# Patient Record
Sex: Male | Born: 1940
Health system: Southern US, Community
[De-identification: ages and names within clinical notes are randomized; demographics above are authoritative.]

## PROBLEM LIST (undated history)

## (undated) DIAGNOSIS — M1990 Unspecified osteoarthritis, unspecified site: Secondary | ICD-10-CM

## (undated) DIAGNOSIS — K219 Gastro-esophageal reflux disease without esophagitis: Secondary | ICD-10-CM

## (undated) DIAGNOSIS — Z85828 Personal history of other malignant neoplasm of skin: Secondary | ICD-10-CM

## (undated) DIAGNOSIS — F32A Depression, unspecified: Secondary | ICD-10-CM

## (undated) DIAGNOSIS — J189 Pneumonia, unspecified organism: Secondary | ICD-10-CM

## (undated) DIAGNOSIS — I10 Essential (primary) hypertension: Secondary | ICD-10-CM

## (undated) DIAGNOSIS — I639 Cerebral infarction, unspecified: Secondary | ICD-10-CM

## (undated) DIAGNOSIS — N529 Male erectile dysfunction, unspecified: Secondary | ICD-10-CM

## (undated) DIAGNOSIS — E785 Hyperlipidemia, unspecified: Secondary | ICD-10-CM

## (undated) HISTORY — PX: HERNIA REPAIR: SHX51

## (undated) HISTORY — DX: Male erectile dysfunction, unspecified: N52.9

## (undated) HISTORY — PX: CATARACT EXTRACTION: SUR2

## (undated) HISTORY — DX: Hyperlipidemia, unspecified: E78.5

## (undated) HISTORY — PX: SKIN CANCER EXCISION: SHX779

## (undated) HISTORY — DX: Essential (primary) hypertension: I10

---

## 1999-02-12 ENCOUNTER — Encounter: Payer: Self-pay | Admitting: Specialist

## 1999-02-12 ENCOUNTER — Encounter: Admission: RE | Admit: 1999-02-12 | Discharge: 1999-02-12 | Payer: Self-pay | Admitting: Specialist

## 1999-04-11 ENCOUNTER — Ambulatory Visit (HOSPITAL_COMMUNITY): Admission: RE | Admit: 1999-04-11 | Discharge: 1999-04-11 | Payer: Self-pay | Admitting: Specialist

## 1999-04-11 ENCOUNTER — Encounter: Payer: Self-pay | Admitting: Specialist

## 1999-04-25 ENCOUNTER — Encounter: Payer: Self-pay | Admitting: Specialist

## 1999-04-25 ENCOUNTER — Ambulatory Visit (HOSPITAL_COMMUNITY): Admission: RE | Admit: 1999-04-25 | Discharge: 1999-04-25 | Payer: Self-pay | Admitting: Specialist

## 1999-05-09 ENCOUNTER — Encounter: Payer: Self-pay | Admitting: Specialist

## 1999-05-09 ENCOUNTER — Ambulatory Visit (HOSPITAL_COMMUNITY): Admission: RE | Admit: 1999-05-09 | Discharge: 1999-05-09 | Payer: Self-pay | Admitting: Specialist

## 1999-06-28 ENCOUNTER — Ambulatory Visit (HOSPITAL_COMMUNITY): Admission: RE | Admit: 1999-06-28 | Discharge: 1999-06-28 | Payer: Self-pay | Admitting: Specialist

## 1999-06-28 ENCOUNTER — Encounter: Payer: Self-pay | Admitting: Specialist

## 2001-02-10 ENCOUNTER — Encounter: Payer: Self-pay | Admitting: Anesthesiology

## 2001-02-10 ENCOUNTER — Encounter: Admission: RE | Admit: 2001-02-10 | Discharge: 2001-02-10 | Payer: Self-pay | Admitting: Anesthesiology

## 2008-12-19 ENCOUNTER — Emergency Department (HOSPITAL_COMMUNITY): Admission: EM | Admit: 2008-12-19 | Discharge: 2008-12-19 | Payer: Self-pay | Admitting: Emergency Medicine

## 2010-07-03 ENCOUNTER — Encounter: Payer: Self-pay | Admitting: Physician Assistant

## 2010-07-03 DIAGNOSIS — E785 Hyperlipidemia, unspecified: Secondary | ICD-10-CM

## 2010-07-03 DIAGNOSIS — I10 Essential (primary) hypertension: Secondary | ICD-10-CM | POA: Insufficient documentation

## 2010-07-03 DIAGNOSIS — J309 Allergic rhinitis, unspecified: Secondary | ICD-10-CM | POA: Insufficient documentation

## 2010-07-03 DIAGNOSIS — K648 Other hemorrhoids: Secondary | ICD-10-CM | POA: Insufficient documentation

## 2010-07-03 DIAGNOSIS — K5730 Diverticulosis of large intestine without perforation or abscess without bleeding: Secondary | ICD-10-CM | POA: Insufficient documentation

## 2010-07-03 DIAGNOSIS — N529 Male erectile dysfunction, unspecified: Secondary | ICD-10-CM

## 2012-06-29 ENCOUNTER — Other Ambulatory Visit: Payer: Self-pay | Admitting: Physician Assistant

## 2012-06-29 NOTE — Telephone Encounter (Signed)
?   Chart says allergic to fluticasone?

## 2012-08-28 ENCOUNTER — Telehealth: Payer: Self-pay

## 2012-08-28 NOTE — Telephone Encounter (Signed)
Per dr Modesto Charon ok to refill 1 month only and pt understands rx called in with no refills and needs to sch appt with new provider since [facility] no longer available. Pt states seen [facility] 02-2012 and needs refill metoprolol 25mg  qd,doxazosin 8mg  qd, pravastatin 40 mg qhs and hctz 25 mg qd

## 2012-08-30 NOTE — Telephone Encounter (Signed)
This was done on Saturday per dr Modesto Charon request.

## 2012-10-12 ENCOUNTER — Ambulatory Visit (INDEPENDENT_AMBULATORY_CARE_PROVIDER_SITE_OTHER): Payer: Medicare Other | Admitting: Family Medicine

## 2012-10-12 ENCOUNTER — Encounter: Payer: Self-pay | Admitting: Family Medicine

## 2012-10-12 VITALS — BP 159/96 | HR 80 | Temp 99.3°F | Ht 68.0 in | Wt 234.4 lb

## 2012-10-12 DIAGNOSIS — E785 Hyperlipidemia, unspecified: Secondary | ICD-10-CM

## 2012-10-12 DIAGNOSIS — I10 Essential (primary) hypertension: Secondary | ICD-10-CM

## 2012-10-12 DIAGNOSIS — N400 Enlarged prostate without lower urinary tract symptoms: Secondary | ICD-10-CM

## 2012-10-12 DIAGNOSIS — J309 Allergic rhinitis, unspecified: Secondary | ICD-10-CM

## 2012-10-12 DIAGNOSIS — Z0000 Encounter for general adult medical examination without abnormal findings: Secondary | ICD-10-CM

## 2012-10-12 DIAGNOSIS — N529 Male erectile dysfunction, unspecified: Secondary | ICD-10-CM

## 2012-10-12 LAB — POCT CBC
Granulocyte percent: 65.8 %G (ref 37–80)
HCT, POC: 47.6 % (ref 43.5–53.7)
Hemoglobin: 16.5 g/dL (ref 14.1–18.1)
Lymph, poc: 2 (ref 0.6–3.4)
MCH, POC: 30.2 pg (ref 27–31.2)
MCHC: 34.7 g/dL (ref 31.8–35.4)
MCV: 87.1 fL (ref 80–97)
MPV: 7.3 fL (ref 0–99.8)
POC Granulocyte: 4.7 (ref 2–6.9)
POC LYMPH PERCENT: 28.2 %L (ref 10–50)
Platelet Count, POC: 210 10*3/uL (ref 142–424)
RBC: 5.5 M/uL (ref 4.69–6.13)
RDW, POC: 12.8 %
WBC: 7.1 10*3/uL (ref 4.6–10.2)

## 2012-10-12 LAB — LIPID PANEL
Cholesterol: 146 mg/dL (ref 0–200)
HDL: 39 mg/dL — ABNORMAL LOW (ref >39)
LDL Cholesterol: 78 mg/dL (ref 0–99)
Total CHOL/HDL Ratio: 3.7 Ratio
Triglycerides: 147 mg/dL (ref <150)
VLDL: 29 mg/dL (ref 0–40)

## 2012-10-12 LAB — COMPLETE METABOLIC PANEL WITH GFR
ALT: 17 U/L (ref 0–53)
AST: 17 U/L (ref 0–37)
Albumin: 4.2 g/dL (ref 3.5–5.2)
Alkaline Phosphatase: 61 U/L (ref 39–117)
BUN: 9 mg/dL (ref 6–23)
CO2: 32 mEq/L (ref 19–32)
Calcium: 9.3 mg/dL (ref 8.4–10.5)
Chloride: 98 mEq/L (ref 96–112)
Creat: 1.07 mg/dL (ref 0.50–1.35)
GFR, Est African American: 81 mL/min
GFR, Est Non African American: 70 mL/min
Glucose, Bld: 115 mg/dL — ABNORMAL HIGH (ref 70–99)
Potassium: 3.9 mEq/L (ref 3.5–5.3)
Sodium: 138 mEq/L (ref 135–145)
Total Bilirubin: 0.6 mg/dL (ref 0.3–1.2)
Total Protein: 6.8 g/dL (ref 6.0–8.3)

## 2012-10-12 LAB — PSA: PSA: 0.56 ng/mL (ref <=4.00)

## 2012-10-12 LAB — TSH: TSH: 2.497 u[IU]/mL (ref 0.350–4.500)

## 2012-10-12 MED ORDER — HYDROCHLOROTHIAZIDE 25 MG PO TABS: 25.0000 mg | ORAL_TABLET | Freq: Every day | ORAL | Status: DC

## 2012-10-12 MED ORDER — METOPROLOL TARTRATE 100 MG PO TABS: ORAL_TABLET | ORAL | Status: DC

## 2012-10-12 MED ORDER — SILDENAFIL CITRATE 100 MG PO TABS: 50.0000 mg | ORAL_TABLET | Freq: Every day | ORAL | Status: DC | PRN

## 2012-10-12 MED ORDER — FLUTICASONE PROPIONATE 50 MCG/ACT NA SUSP: 2.0000 | Freq: Every day | NASAL | Status: DC

## 2012-10-12 MED ORDER — QUINAPRIL HCL 40 MG PO TABS: 40.0000 mg | ORAL_TABLET | Freq: Every day | ORAL | Status: DC

## 2012-10-12 MED ORDER — PRAVASTATIN SODIUM 40 MG PO TABS: 40.0000 mg | ORAL_TABLET | Freq: Every day | ORAL | Status: DC

## 2012-10-12 MED ORDER — DOXAZOSIN MESYLATE 8 MG PO TABS: 8.0000 mg | ORAL_TABLET | Freq: Every day | ORAL | Status: DC

## 2012-10-12 NOTE — Progress Notes (Signed)
  Subjective:    Patient ID: Derrick Lawrence, male    DOB: 12/29/1940, 72 y.o.   MRN: [id number]  Hypertension This is a chronic problem. The problem is uncontrolled. Pertinent negatives include no blurred vision, palpitations or shortness of breath. Risk factors for coronary artery disease include male gender. Past treatments include ACE inhibitors, alpha 1 blockers and diuretics. The current treatment provides no improvement. There is no history of a thyroid problem.  Hyperlipidemia This is a chronic problem. The current episode started more than 1 year ago. The problem is controlled. Recent lipid tests were reviewed and are normal. He has no history of diabetes. Pertinent negatives include no leg pain, myalgias or shortness of breath. Current antihyperlipidemic treatment includes statins. The current treatment provides moderate improvement of lipids. Risk factors for coronary artery disease include dyslipidemia, hypertension and male sex.      Review of Systems  Eyes: Negative for blurred vision.  Respiratory: Negative for shortness of breath.   Cardiovascular: Negative for palpitations.  Musculoskeletal: Negative for myalgias.       Right shoulder pain with movement   All other systems reviewed and are negative.       Objective:   Physical Exam  Constitutional: He is oriented to person, place, and time. He appears well-developed and well-nourished.  HENT:  Head: Normocephalic.  Eyes: Pupils are equal, round, and reactive to light.  Neck: Normal range of motion. Neck supple. No thyromegaly present.  Cardiovascular: Normal rate, normal heart sounds and intact distal pulses.   Pulmonary/Chest: Effort normal and breath sounds normal.  Abdominal: Soft. Bowel sounds are normal. He exhibits no distension. There is no tenderness.  Musculoskeletal: Normal range of motion. He exhibits tenderness. He exhibits no edema.  Decreased ROM in right shoulder due to pain   Neurological: He is  alert and oriented to person, place, and time.  Skin: Skin is warm and dry.  Psychiatric: He has a normal mood and affect. His behavior is normal. Judgment and thought content normal.    BP 159/96  Pulse 80  Temp(Src) 99.3 F (37.4 C) (Oral)  Ht 5\' 8"  (1.727 m)  Wt 234 lb 6.4 oz (106.323 kg)  BMI 35.65 kg/m2       Assessment & Plan:  Essential hypertension, benign - Plan: quinapril (ACCUPRIL) 40 MG tablet, hydrochlorothiazide (HYDRODIURIL) 25 MG tablet, POCT CBC  Allergic rhinitis - Plan: fluticasone (FLONASE) 50 MCG/ACT nasal spray  BPH (benign prostatic hyperplasia) - Plan: doxazosin (CARDURA) 8 MG tablet, PSA  Other and unspecified hyperlipidemia - Plan: pravastatin (PRAVACHOL) 40 MG tablet, Lipid panel  Routine general medical examination at a health care facility - Plan: POCT CBC, Lipid panel, TSH, COMPLETE METABOLIC PANEL WITH GFR, PSA  Shoulder pain. Advised patient to follow up for shoulder injection for shoulder arthralgias.

## 2012-10-12 NOTE — Patient Instructions (Addendum)
Hypertension As your heart beats, it forces blood through your arteries. This force is your blood pressure. If the pressure is too high, it is called hypertension (HTN) or high blood pressure. HTN is dangerous because you may have it and not know it. High blood pressure may mean that your heart has to work harder to pump blood. Your arteries may be narrow or stiff. The extra work puts you at risk for heart disease, stroke, and other problems.  Blood pressure consists of two numbers, a higher number over a lower, 110/72, for example. It is stated as "110 over 72." The ideal is below 120 for the top number (systolic) and under 80 for the bottom (diastolic). Write down your blood pressure today. You should pay close attention to your blood pressure if you have certain conditions such as:  Heart failure.  Prior heart attack.  Diabetes  Chronic kidney disease.  Prior stroke.  Multiple risk factors for heart disease. To see if you have HTN, your blood pressure should be measured while you are seated with your arm held at the level of the heart. It should be measured at least twice. A one-time elevated blood pressure reading (especially in the Emergency Department) does not mean that you need treatment. There may be conditions in which the blood pressure is different between your right and left arms. It is important to see your caregiver soon for a recheck. Most people have essential hypertension which means that there is not a specific cause. This type of high blood pressure may be lowered by changing lifestyle factors such as:  Stress.  Smoking.  Lack of exercise.  Excessive weight.  Drug/tobacco/alcohol use.  Eating less salt. Most people do not have symptoms from high blood pressure until it has caused damage to the body. Effective treatment can often prevent, delay or reduce that damage. TREATMENT  When a cause has been identified, treatment for high blood pressure is directed at the  cause. There are a large number of medications to treat HTN. These fall into several categories, and your caregiver will help you select the medicines that are best for you. Medications may have side effects. You should review side effects with your caregiver. If your blood pressure stays high after you have made lifestyle changes or started on medicines,   Your medication(s) may need to be changed.  Other problems may need to be addressed.  Be certain you understand your prescriptions, and know how and when to take your medicine.  Be sure to follow up with your caregiver within the time frame advised (usually within two weeks) to have your blood pressure rechecked and to review your medications.  If you are taking more than one medicine to lower your blood pressure, make sure you know how and at what times they should be taken. Taking two medicines at the same time can result in blood pressure that is too low. SEEK IMMEDIATE MEDICAL CARE IF:  You develop a severe headache, blurred or changing vision, or confusion.  You have unusual weakness or numbness, or a faint feeling.  You have severe chest or abdominal pain, vomiting, or breathing problems. MAKE SURE YOU:   Understand these instructions.  Will watch your condition.  Will get help right away if you are not doing well or get worse. Document Released: 03/10/2005 Document Revised: 06/02/2011 Document Reviewed: 10/29/2007 [facility] Patient Information 2014 [place], [place]. Shoulder Pain The shoulder is the joint that connects your arms to your body. The bones  that form the shoulder joint include the upper arm bone (humerus), the shoulder blade (scapula), and the collarbone (clavicle). The top of the humerus is shaped like a ball and fits into a rather flat socket on the scapula (glenoid cavity). A combination of muscles and strong, fibrous tissues that connect muscles to bones (tendons) support your shoulder joint and hold the ball in  the socket. Small, fluid-filled sacs (bursae) are located in different areas of the joint. They act as cushions between the bones and the overlying soft tissues and help reduce friction between the gliding tendons and the bone as you move your arm. Your shoulder joint allows a wide range of motion in your arm. This range of motion allows you to do things like scratch your back or throw a ball. However, this range of motion also makes your shoulder more prone to pain from overuse and injury. Causes of shoulder pain can originate from both injury and overuse and usually can be grouped in the following four categories:  Redness, swelling, and pain (inflammation) of the tendon (tendinitis) or the bursae (bursitis).  Instability, such as a dislocation of the joint.  Inflammation of the joint (arthritis).  Broken bone (fracture). HOME CARE INSTRUCTIONS   Apply ice to the sore area.  Put ice in a plastic bag.  Place a towel between your skin and the bag.  Leave the ice on for 15-20 minutes, 3-4 times per day for the first 2 days.  Stop using cold packs if they do not help with the pain.  If you have a shoulder sling or immobilizer, wear it as long as your caregiver instructs. Only remove it to shower or bathe. Move your arm as little as possible, but keep your hand moving to prevent swelling.  Squeeze a soft ball or foam pad as much as possible to help prevent swelling.  Only take over-the-counter or prescription medicines for pain, discomfort, or fever as directed by your caregiver. SEEK MEDICAL CARE IF:   Your shoulder pain increases, or new pain develops in your arm, hand, or fingers.  Your hand or fingers become cold and numb.  Your pain is not relieved with medicines. SEEK IMMEDIATE MEDICAL CARE IF:   Your arm, hand, or fingers are numb or tingling.  Your arm, hand, or fingers are significantly swollen or turn white or blue. MAKE SURE YOU:   Understand these  instructions.  Will watch your condition.  Will get help right away if you are not doing well or get worse. Document Released: 12/18/2004 Document Revised: 12/03/2011 Document Reviewed: 02/22/2011 [facility] Patient Information 2014 [place], [place].

## 2012-10-13 ENCOUNTER — Ambulatory Visit (INDEPENDENT_AMBULATORY_CARE_PROVIDER_SITE_OTHER): Payer: Medicare Other

## 2012-10-13 DIAGNOSIS — M25511 Pain in right shoulder: Secondary | ICD-10-CM

## 2012-10-13 DIAGNOSIS — M25519 Pain in unspecified shoulder: Secondary | ICD-10-CM

## 2012-10-13 MED ORDER — TRIAMCINOLONE ACETONIDE 40 MG/ML IJ SUSP
40.0000 mg | Freq: Once | INTRAMUSCULAR | Status: AC
  Administered 2012-10-13: 40 mg via INTRAMUSCULAR

## 2012-10-13 MED ORDER — LIDOCAINE HCL 1 % IJ SOLN
10.0000 mg | Freq: Once | INTRAMUSCULAR | Status: AC
  Administered 2012-10-13: 10 mg

## 2012-10-26 ENCOUNTER — Encounter: Payer: Self-pay | Admitting: Family Medicine

## 2012-10-26 ENCOUNTER — Ambulatory Visit (INDEPENDENT_AMBULATORY_CARE_PROVIDER_SITE_OTHER): Payer: Medicare Other | Admitting: Family Medicine

## 2012-10-26 VITALS — BP 148/90 | HR 66 | Temp 97.8°F | Wt 231.6 lb

## 2012-10-26 DIAGNOSIS — I10 Essential (primary) hypertension: Secondary | ICD-10-CM

## 2012-10-26 DIAGNOSIS — N400 Enlarged prostate without lower urinary tract symptoms: Secondary | ICD-10-CM

## 2012-10-26 MED ORDER — DOXAZOSIN MESYLATE 8 MG PO TABS
8.0000 mg | ORAL_TABLET | Freq: Every day | ORAL | Status: DC
Start: 2012-10-26 — End: 2024-02-17

## 2012-10-26 NOTE — Progress Notes (Signed)
  Subjective:    Patient ID: HASSANI SLINEY, male    DOB: 17-Jun-1940, 72 y.o.   MRN: [id number]  HPI  This 72 y.o. male presents for evaluation of follow up on hypertension.  He had his bp medicine Adjusted last visit and is here for follow up.  He did not get his bph medicine last visit.  Review of Systems No chest pain, SOB, HA, dizziness, vision change, N/V, diarrhea, constipation, dysuria, urinary urgency or frequency, myalgias, arthralgias or rash.     Objective:   Physical Exam  Vital signs noted  Well developed well nourished male.  HEENT - Head atraumatic Normocephalic                Eyes - PERRLA, Conjuctiva - clear Sclera- Clear EOMI                Ears - EAC's Wnl TM's Wnl Gross Hearing WNL                Nose - Nares patent                 Throat - oropharanx wnl Respiratory - Lungs CTA bilateral Cardiac - RRR S1 and S2 without murmur GI - Abdomen soft Nontender and bowel sounds active x 4 Extremities - No edema. Neuro - Grossly intact.      Assessment & Plan:  Essential hypertension, benign - Continue current regimen.  BPH (benign prostatic hyperplasia) - Plan: doxazosin (CARDURA) 8 MG tablet

## 2012-10-26 NOTE — Patient Instructions (Signed)

## 2013-04-28 ENCOUNTER — Ambulatory Visit: Payer: Medicare Other | Admitting: Family Medicine

## 2015-04-16 DIAGNOSIS — M25561 Pain in right knee: Secondary | ICD-10-CM | POA: Diagnosis not present

## 2015-04-16 DIAGNOSIS — M170 Bilateral primary osteoarthritis of knee: Secondary | ICD-10-CM | POA: Diagnosis not present

## 2015-07-17 DIAGNOSIS — M25562 Pain in left knee: Secondary | ICD-10-CM | POA: Diagnosis not present

## 2015-07-17 DIAGNOSIS — M25561 Pain in right knee: Secondary | ICD-10-CM | POA: Diagnosis not present

## 2015-07-17 DIAGNOSIS — M170 Bilateral primary osteoarthritis of knee: Secondary | ICD-10-CM | POA: Diagnosis not present

## 2015-07-23 DIAGNOSIS — E782 Mixed hyperlipidemia: Secondary | ICD-10-CM | POA: Diagnosis not present

## 2015-07-23 DIAGNOSIS — I10 Essential (primary) hypertension: Secondary | ICD-10-CM | POA: Diagnosis not present

## 2015-07-23 DIAGNOSIS — Z86010 Personal history of colonic polyps: Secondary | ICD-10-CM | POA: Diagnosis not present

## 2015-07-25 DIAGNOSIS — R262 Difficulty in walking, not elsewhere classified: Secondary | ICD-10-CM | POA: Diagnosis not present

## 2015-07-25 DIAGNOSIS — M1711 Unilateral primary osteoarthritis, right knee: Secondary | ICD-10-CM | POA: Diagnosis not present

## 2015-07-25 DIAGNOSIS — R2689 Other abnormalities of gait and mobility: Secondary | ICD-10-CM | POA: Diagnosis not present

## 2015-07-25 DIAGNOSIS — M25561 Pain in right knee: Secondary | ICD-10-CM | POA: Diagnosis not present

## 2015-07-31 DIAGNOSIS — M1711 Unilateral primary osteoarthritis, right knee: Secondary | ICD-10-CM | POA: Diagnosis not present

## 2015-07-31 DIAGNOSIS — M25561 Pain in right knee: Secondary | ICD-10-CM | POA: Diagnosis not present

## 2015-08-02 DIAGNOSIS — K635 Polyp of colon: Secondary | ICD-10-CM | POA: Diagnosis not present

## 2015-08-02 DIAGNOSIS — K5730 Diverticulosis of large intestine without perforation or abscess without bleeding: Secondary | ICD-10-CM | POA: Diagnosis not present

## 2015-08-02 DIAGNOSIS — Z1211 Encounter for screening for malignant neoplasm of colon: Secondary | ICD-10-CM | POA: Diagnosis not present

## 2015-08-02 DIAGNOSIS — D123 Benign neoplasm of transverse colon: Secondary | ICD-10-CM | POA: Diagnosis not present

## 2015-08-02 DIAGNOSIS — Z86010 Personal history of colonic polyps: Secondary | ICD-10-CM | POA: Diagnosis not present

## 2015-08-07 DIAGNOSIS — M25561 Pain in right knee: Secondary | ICD-10-CM | POA: Diagnosis not present

## 2015-08-07 DIAGNOSIS — M1711 Unilateral primary osteoarthritis, right knee: Secondary | ICD-10-CM | POA: Diagnosis not present

## 2015-08-15 DIAGNOSIS — M25561 Pain in right knee: Secondary | ICD-10-CM | POA: Diagnosis not present

## 2015-08-15 DIAGNOSIS — M1711 Unilateral primary osteoarthritis, right knee: Secondary | ICD-10-CM | POA: Diagnosis not present

## 2015-08-23 DIAGNOSIS — R2689 Other abnormalities of gait and mobility: Secondary | ICD-10-CM | POA: Diagnosis not present

## 2015-08-23 DIAGNOSIS — M25561 Pain in right knee: Secondary | ICD-10-CM | POA: Diagnosis not present

## 2015-08-23 DIAGNOSIS — M1711 Unilateral primary osteoarthritis, right knee: Secondary | ICD-10-CM | POA: Diagnosis not present

## 2016-01-21 DIAGNOSIS — K5900 Constipation, unspecified: Secondary | ICD-10-CM | POA: Diagnosis not present

## 2016-01-21 DIAGNOSIS — L570 Actinic keratosis: Secondary | ICD-10-CM | POA: Diagnosis not present

## 2016-01-21 DIAGNOSIS — N138 Other obstructive and reflux uropathy: Secondary | ICD-10-CM | POA: Diagnosis not present

## 2016-01-21 DIAGNOSIS — J301 Allergic rhinitis due to pollen: Secondary | ICD-10-CM | POA: Diagnosis not present

## 2016-01-21 DIAGNOSIS — Z23 Encounter for immunization: Secondary | ICD-10-CM | POA: Diagnosis not present

## 2016-01-21 DIAGNOSIS — M1711 Unilateral primary osteoarthritis, right knee: Secondary | ICD-10-CM | POA: Diagnosis not present

## 2016-01-21 DIAGNOSIS — N401 Enlarged prostate with lower urinary tract symptoms: Secondary | ICD-10-CM | POA: Diagnosis not present

## 2016-01-21 DIAGNOSIS — I10 Essential (primary) hypertension: Secondary | ICD-10-CM | POA: Diagnosis not present

## 2016-01-21 DIAGNOSIS — Z0001 Encounter for general adult medical examination with abnormal findings: Secondary | ICD-10-CM | POA: Diagnosis not present

## 2016-01-21 DIAGNOSIS — E7800 Pure hypercholesterolemia, unspecified: Secondary | ICD-10-CM | POA: Diagnosis not present

## 2016-01-24 DIAGNOSIS — J301 Allergic rhinitis due to pollen: Secondary | ICD-10-CM | POA: Diagnosis not present

## 2016-01-24 DIAGNOSIS — Z23 Encounter for immunization: Secondary | ICD-10-CM | POA: Diagnosis not present

## 2016-01-24 DIAGNOSIS — N138 Other obstructive and reflux uropathy: Secondary | ICD-10-CM | POA: Diagnosis not present

## 2016-01-24 DIAGNOSIS — K5900 Constipation, unspecified: Secondary | ICD-10-CM | POA: Diagnosis not present

## 2016-01-24 DIAGNOSIS — E7800 Pure hypercholesterolemia, unspecified: Secondary | ICD-10-CM | POA: Diagnosis not present

## 2016-01-24 DIAGNOSIS — L570 Actinic keratosis: Secondary | ICD-10-CM | POA: Diagnosis not present

## 2016-01-24 DIAGNOSIS — N401 Enlarged prostate with lower urinary tract symptoms: Secondary | ICD-10-CM | POA: Diagnosis not present

## 2016-01-24 DIAGNOSIS — Z0001 Encounter for general adult medical examination with abnormal findings: Secondary | ICD-10-CM | POA: Diagnosis not present

## 2016-01-24 DIAGNOSIS — M1711 Unilateral primary osteoarthritis, right knee: Secondary | ICD-10-CM | POA: Diagnosis not present

## 2016-01-24 DIAGNOSIS — I10 Essential (primary) hypertension: Secondary | ICD-10-CM | POA: Diagnosis not present

## 2017-01-26 DIAGNOSIS — Z0000 Encounter for general adult medical examination without abnormal findings: Secondary | ICD-10-CM | POA: Diagnosis not present

## 2017-01-26 DIAGNOSIS — B002 Herpesviral gingivostomatitis and pharyngotonsillitis: Secondary | ICD-10-CM | POA: Diagnosis not present

## 2017-01-26 DIAGNOSIS — I10 Essential (primary) hypertension: Secondary | ICD-10-CM | POA: Diagnosis not present

## 2017-01-26 DIAGNOSIS — L570 Actinic keratosis: Secondary | ICD-10-CM | POA: Diagnosis not present

## 2017-01-26 DIAGNOSIS — E7800 Pure hypercholesterolemia, unspecified: Secondary | ICD-10-CM | POA: Diagnosis not present

## 2017-02-09 DIAGNOSIS — M25561 Pain in right knee: Secondary | ICD-10-CM | POA: Diagnosis not present

## 2018-01-07 DIAGNOSIS — E7800 Pure hypercholesterolemia, unspecified: Secondary | ICD-10-CM | POA: Diagnosis not present

## 2018-01-07 DIAGNOSIS — I10 Essential (primary) hypertension: Secondary | ICD-10-CM | POA: Diagnosis not present

## 2018-01-28 DIAGNOSIS — J301 Allergic rhinitis due to pollen: Secondary | ICD-10-CM | POA: Diagnosis not present

## 2018-01-28 DIAGNOSIS — I10 Essential (primary) hypertension: Secondary | ICD-10-CM | POA: Diagnosis not present

## 2018-01-28 DIAGNOSIS — N138 Other obstructive and reflux uropathy: Secondary | ICD-10-CM | POA: Diagnosis not present

## 2018-01-28 DIAGNOSIS — Z0000 Encounter for general adult medical examination without abnormal findings: Secondary | ICD-10-CM | POA: Diagnosis not present

## 2018-01-28 DIAGNOSIS — N401 Enlarged prostate with lower urinary tract symptoms: Secondary | ICD-10-CM | POA: Diagnosis not present

## 2018-01-28 DIAGNOSIS — E7800 Pure hypercholesterolemia, unspecified: Secondary | ICD-10-CM | POA: Diagnosis not present

## 2018-04-02 DIAGNOSIS — H02831 Dermatochalasis of right upper eyelid: Secondary | ICD-10-CM | POA: Diagnosis not present

## 2018-04-02 DIAGNOSIS — H34831 Tributary (branch) retinal vein occlusion, right eye, with macular edema: Secondary | ICD-10-CM | POA: Diagnosis not present

## 2018-04-02 DIAGNOSIS — H25812 Combined forms of age-related cataract, left eye: Secondary | ICD-10-CM | POA: Diagnosis not present

## 2018-04-02 DIAGNOSIS — H2511 Age-related nuclear cataract, right eye: Secondary | ICD-10-CM | POA: Diagnosis not present

## 2018-04-02 DIAGNOSIS — Z01818 Encounter for other preprocedural examination: Secondary | ICD-10-CM | POA: Diagnosis not present

## 2018-04-15 DIAGNOSIS — H2512 Age-related nuclear cataract, left eye: Secondary | ICD-10-CM | POA: Diagnosis not present

## 2018-04-15 DIAGNOSIS — H25812 Combined forms of age-related cataract, left eye: Secondary | ICD-10-CM | POA: Diagnosis not present

## 2018-04-28 DIAGNOSIS — H43812 Vitreous degeneration, left eye: Secondary | ICD-10-CM | POA: Diagnosis not present

## 2018-04-28 DIAGNOSIS — H34831 Tributary (branch) retinal vein occlusion, right eye, with macular edema: Secondary | ICD-10-CM | POA: Diagnosis not present

## 2018-05-04 DIAGNOSIS — H34831 Tributary (branch) retinal vein occlusion, right eye, with macular edema: Secondary | ICD-10-CM | POA: Diagnosis not present

## 2018-05-06 DIAGNOSIS — H25811 Combined forms of age-related cataract, right eye: Secondary | ICD-10-CM | POA: Diagnosis not present

## 2018-05-06 DIAGNOSIS — H2511 Age-related nuclear cataract, right eye: Secondary | ICD-10-CM | POA: Diagnosis not present

## 2018-05-14 DIAGNOSIS — H02831 Dermatochalasis of right upper eyelid: Secondary | ICD-10-CM | POA: Diagnosis not present

## 2018-05-26 DIAGNOSIS — H02831 Dermatochalasis of right upper eyelid: Secondary | ICD-10-CM | POA: Diagnosis not present

## 2018-06-02 DIAGNOSIS — H34831 Tributary (branch) retinal vein occlusion, right eye, with macular edema: Secondary | ICD-10-CM | POA: Diagnosis not present

## 2018-07-30 DIAGNOSIS — H02831 Dermatochalasis of right upper eyelid: Secondary | ICD-10-CM | POA: Diagnosis not present

## 2018-07-30 DIAGNOSIS — H34831 Tributary (branch) retinal vein occlusion, right eye, with macular edema: Secondary | ICD-10-CM | POA: Diagnosis not present

## 2018-08-24 DIAGNOSIS — Z01818 Encounter for other preprocedural examination: Secondary | ICD-10-CM | POA: Diagnosis not present

## 2018-08-24 DIAGNOSIS — H34831 Tributary (branch) retinal vein occlusion, right eye, with macular edema: Secondary | ICD-10-CM | POA: Diagnosis not present

## 2018-08-24 DIAGNOSIS — D485 Neoplasm of uncertain behavior of skin: Secondary | ICD-10-CM | POA: Diagnosis not present

## 2018-08-24 DIAGNOSIS — H02831 Dermatochalasis of right upper eyelid: Secondary | ICD-10-CM | POA: Diagnosis not present

## 2018-09-02 DIAGNOSIS — D485 Neoplasm of uncertain behavior of skin: Secondary | ICD-10-CM | POA: Diagnosis not present

## 2018-09-02 DIAGNOSIS — H5789 Other specified disorders of eye and adnexa: Secondary | ICD-10-CM | POA: Diagnosis not present

## 2018-09-02 DIAGNOSIS — H02831 Dermatochalasis of right upper eyelid: Secondary | ICD-10-CM | POA: Diagnosis not present

## 2018-09-02 DIAGNOSIS — C44111 Basal cell carcinoma of skin of unspecified eyelid, including canthus: Secondary | ICD-10-CM | POA: Diagnosis not present

## 2018-09-02 DIAGNOSIS — H02834 Dermatochalasis of left upper eyelid: Secondary | ICD-10-CM | POA: Diagnosis not present

## 2018-10-11 DIAGNOSIS — H34831 Tributary (branch) retinal vein occlusion, right eye, with macular edema: Secondary | ICD-10-CM | POA: Diagnosis not present

## 2018-10-25 DIAGNOSIS — R110 Nausea: Secondary | ICD-10-CM | POA: Diagnosis not present

## 2018-10-25 DIAGNOSIS — R5383 Other fatigue: Secondary | ICD-10-CM | POA: Diagnosis not present

## 2018-10-25 DIAGNOSIS — M791 Myalgia, unspecified site: Secondary | ICD-10-CM | POA: Diagnosis not present

## 2018-11-15 DIAGNOSIS — H34831 Tributary (branch) retinal vein occlusion, right eye, with macular edema: Secondary | ICD-10-CM | POA: Diagnosis not present

## 2018-11-23 DIAGNOSIS — D485 Neoplasm of uncertain behavior of skin: Secondary | ICD-10-CM | POA: Diagnosis not present

## 2018-11-23 DIAGNOSIS — C44529 Squamous cell carcinoma of skin of other part of trunk: Secondary | ICD-10-CM | POA: Diagnosis not present

## 2018-11-23 DIAGNOSIS — C441192 Basal cell carcinoma of skin of left lower eyelid, including canthus: Secondary | ICD-10-CM | POA: Diagnosis not present

## 2018-12-20 DIAGNOSIS — C44529 Squamous cell carcinoma of skin of other part of trunk: Secondary | ICD-10-CM | POA: Diagnosis not present

## 2018-12-28 DIAGNOSIS — C441192 Basal cell carcinoma of skin of left lower eyelid, including canthus: Secondary | ICD-10-CM | POA: Diagnosis not present

## 2019-01-10 DIAGNOSIS — H34831 Tributary (branch) retinal vein occlusion, right eye, with macular edema: Secondary | ICD-10-CM | POA: Diagnosis not present

## 2019-01-13 ENCOUNTER — Emergency Department (HOSPITAL_COMMUNITY): Payer: PPO

## 2019-01-13 ENCOUNTER — Inpatient Hospital Stay (HOSPITAL_COMMUNITY)
Admission: EM | Admit: 2019-01-13 | Discharge: 2019-01-16 | DRG: 065 | Disposition: A | Discharge status: Home / Self Care | Payer: PPO | Attending: Family Medicine | Admitting: Family Medicine

## 2019-01-13 ENCOUNTER — Other Ambulatory Visit: Payer: Self-pay

## 2019-01-13 DIAGNOSIS — Z79899 Other long term (current) drug therapy: Secondary | ICD-10-CM

## 2019-01-13 DIAGNOSIS — Z888 Allergy status to other drugs, medicaments and biological substances status: Secondary | ICD-10-CM

## 2019-01-13 DIAGNOSIS — G8191 Hemiplegia, unspecified affecting right dominant side: Secondary | ICD-10-CM | POA: Diagnosis not present

## 2019-01-13 DIAGNOSIS — Z85828 Personal history of other malignant neoplasm of skin: Secondary | ICD-10-CM

## 2019-01-13 DIAGNOSIS — I10 Essential (primary) hypertension: Secondary | ICD-10-CM | POA: Diagnosis not present

## 2019-01-13 DIAGNOSIS — I6389 Other cerebral infarction: Principal | ICD-10-CM | POA: Diagnosis present

## 2019-01-13 DIAGNOSIS — M25511 Pain in right shoulder: Secondary | ICD-10-CM | POA: Diagnosis not present

## 2019-01-13 DIAGNOSIS — R29810 Facial weakness: Secondary | ICD-10-CM | POA: Diagnosis not present

## 2019-01-13 DIAGNOSIS — I639 Cerebral infarction, unspecified: Secondary | ICD-10-CM | POA: Diagnosis not present

## 2019-01-13 DIAGNOSIS — Z823 Family history of stroke: Secondary | ICD-10-CM | POA: Diagnosis not present

## 2019-01-13 DIAGNOSIS — N179 Acute kidney failure, unspecified: Secondary | ICD-10-CM | POA: Diagnosis not present

## 2019-01-13 DIAGNOSIS — Z87438 Personal history of other diseases of male genital organs: Secondary | ICD-10-CM | POA: Diagnosis not present

## 2019-01-13 DIAGNOSIS — Z20828 Contact with and (suspected) exposure to other viral communicable diseases: Secondary | ICD-10-CM | POA: Diagnosis present

## 2019-01-13 DIAGNOSIS — I493 Ventricular premature depolarization: Secondary | ICD-10-CM | POA: Diagnosis present

## 2019-01-13 DIAGNOSIS — Z87891 Personal history of nicotine dependence: Secondary | ICD-10-CM

## 2019-01-13 DIAGNOSIS — J90 Pleural effusion, not elsewhere classified: Secondary | ICD-10-CM | POA: Diagnosis present

## 2019-01-13 DIAGNOSIS — R29818 Other symptoms and signs involving the nervous system: Secondary | ICD-10-CM | POA: Diagnosis not present

## 2019-01-13 DIAGNOSIS — E871 Hypo-osmolality and hyponatremia: Secondary | ICD-10-CM | POA: Diagnosis not present

## 2019-01-13 DIAGNOSIS — R29703 NIHSS score 3: Secondary | ICD-10-CM | POA: Diagnosis present

## 2019-01-13 DIAGNOSIS — E876 Hypokalemia: Secondary | ICD-10-CM | POA: Diagnosis not present

## 2019-01-13 DIAGNOSIS — E785 Hyperlipidemia, unspecified: Secondary | ICD-10-CM | POA: Diagnosis not present

## 2019-01-13 DIAGNOSIS — G8929 Other chronic pain: Secondary | ICD-10-CM | POA: Diagnosis not present

## 2019-01-13 DIAGNOSIS — I6329 Cerebral infarction due to unspecified occlusion or stenosis of other precerebral arteries: Secondary | ICD-10-CM | POA: Diagnosis not present

## 2019-01-13 DIAGNOSIS — Z9841 Cataract extraction status, right eye: Secondary | ICD-10-CM

## 2019-01-13 HISTORY — DX: Personal history of other malignant neoplasm of skin: Z85.828

## 2019-01-13 LAB — COMPREHENSIVE METABOLIC PANEL
ALT: 18 U/L (ref 0–44)
AST: 18 U/L (ref 15–41)
Albumin: 3.2 g/dL — ABNORMAL LOW (ref 3.5–5.0)
Alkaline Phosphatase: 78 U/L (ref 38–126)
Anion gap: 11 (ref 5–15)
BUN: 15 mg/dL (ref 8–23)
CO2: 26 mmol/L (ref 22–32)
Calcium: 8.6 mg/dL — ABNORMAL LOW (ref 8.9–10.3)
Chloride: 93 mmol/L — ABNORMAL LOW (ref 98–111)
Creatinine, Ser: 1.17 mg/dL (ref 0.61–1.24)
GFR calc Af Amer: >60 mL/min (ref >60)
GFR calc non Af Amer: >60 mL/min (ref >60)
Glucose, Bld: 126 mg/dL — ABNORMAL HIGH (ref 70–99)
Potassium: 2.8 mmol/L — ABNORMAL LOW (ref 3.5–5.1)
Sodium: 130 mmol/L — ABNORMAL LOW (ref 135–145)
Total Bilirubin: 0.6 mg/dL (ref 0.3–1.2)
Total Protein: 6.1 g/dL — ABNORMAL LOW (ref 6.5–8.1)

## 2019-01-13 LAB — APTT: aPTT: 33 seconds (ref 24–36)

## 2019-01-13 LAB — I-STAT CHEM 8, ED
BUN: 16 mg/dL (ref 8–23)
Calcium, Ion: 1.04 mmol/L — ABNORMAL LOW (ref 1.15–1.40)
Chloride: 89 mmol/L — ABNORMAL LOW (ref 98–111)
Creatinine, Ser: 1.1 mg/dL (ref 0.61–1.24)
Glucose, Bld: 123 mg/dL — ABNORMAL HIGH (ref 70–99)
HCT: 42 % (ref 39.0–52.0)
Hemoglobin: 14.3 g/dL (ref 13.0–17.0)
Potassium: 3 mmol/L — ABNORMAL LOW (ref 3.5–5.1)
Sodium: 130 mmol/L — ABNORMAL LOW (ref 135–145)
TCO2: 29 mmol/L (ref 22–32)

## 2019-01-13 LAB — CBC
HCT: 41.7 % (ref 39.0–52.0)
Hemoglobin: 15 g/dL (ref 13.0–17.0)
MCH: 30.8 pg (ref 26.0–34.0)
MCHC: 36 g/dL (ref 30.0–36.0)
MCV: 85.6 fL (ref 80.0–100.0)
Platelets: 215 10*3/uL (ref 150–400)
RBC: 4.87 MIL/uL (ref 4.22–5.81)
RDW: 12.4 % (ref 11.5–15.5)
WBC: 8.8 10*3/uL (ref 4.0–10.5)
nRBC: 0 % (ref 0.0–0.2)

## 2019-01-13 LAB — DIFFERENTIAL
Abs Immature Granulocytes: 0.04 10*3/uL (ref 0.00–0.07)
Basophils Absolute: 0.1 10*3/uL (ref 0.0–0.1)
Basophils Relative: 1 %
Eosinophils Absolute: 0.2 10*3/uL (ref 0.0–0.5)
Eosinophils Relative: 3 %
Immature Granulocytes: 1 %
Lymphocytes Relative: 33 %
Lymphs Abs: 2.9 10*3/uL (ref 0.7–4.0)
Monocytes Absolute: 0.8 10*3/uL (ref 0.1–1.0)
Monocytes Relative: 9 %
Neutro Abs: 4.8 10*3/uL (ref 1.7–7.7)
Neutrophils Relative %: 53 %

## 2019-01-13 LAB — PROTIME-INR
INR: 1 (ref 0.8–1.2)
Prothrombin Time: 12.9 seconds (ref 11.4–15.2)

## 2019-01-13 NOTE — ED Triage Notes (Signed)
Pt states that his family thinks he has had a stroke. States when he sat down to eat he could not hold his fork in his right hand, and was "falling" to the right. This occurred about 12p.m. this afternoon.

## 2019-01-14 ENCOUNTER — Inpatient Hospital Stay (HOSPITAL_COMMUNITY): Payer: PPO

## 2019-01-14 ENCOUNTER — Encounter (HOSPITAL_COMMUNITY): Payer: Self-pay | Admitting: Internal Medicine

## 2019-01-14 ENCOUNTER — Emergency Department (HOSPITAL_COMMUNITY): Payer: PPO

## 2019-01-14 DIAGNOSIS — I639 Cerebral infarction, unspecified: Secondary | ICD-10-CM | POA: Diagnosis not present

## 2019-01-14 DIAGNOSIS — Z79899 Other long term (current) drug therapy: Secondary | ICD-10-CM | POA: Diagnosis not present

## 2019-01-14 DIAGNOSIS — I6329 Cerebral infarction due to unspecified occlusion or stenosis of other precerebral arteries: Secondary | ICD-10-CM | POA: Diagnosis not present

## 2019-01-14 DIAGNOSIS — Z87891 Personal history of nicotine dependence: Secondary | ICD-10-CM | POA: Diagnosis not present

## 2019-01-14 DIAGNOSIS — Z9841 Cataract extraction status, right eye: Secondary | ICD-10-CM | POA: Diagnosis not present

## 2019-01-14 DIAGNOSIS — E876 Hypokalemia: Secondary | ICD-10-CM | POA: Diagnosis present

## 2019-01-14 DIAGNOSIS — Z888 Allergy status to other drugs, medicaments and biological substances status: Secondary | ICD-10-CM | POA: Diagnosis not present

## 2019-01-14 DIAGNOSIS — Z87438 Personal history of other diseases of male genital organs: Secondary | ICD-10-CM

## 2019-01-14 DIAGNOSIS — E785 Hyperlipidemia, unspecified: Secondary | ICD-10-CM | POA: Diagnosis present

## 2019-01-14 DIAGNOSIS — M25511 Pain in right shoulder: Secondary | ICD-10-CM | POA: Diagnosis present

## 2019-01-14 DIAGNOSIS — I10 Essential (primary) hypertension: Secondary | ICD-10-CM | POA: Diagnosis present

## 2019-01-14 DIAGNOSIS — G8929 Other chronic pain: Secondary | ICD-10-CM | POA: Diagnosis present

## 2019-01-14 DIAGNOSIS — E871 Hypo-osmolality and hyponatremia: Secondary | ICD-10-CM

## 2019-01-14 DIAGNOSIS — I6389 Other cerebral infarction: Secondary | ICD-10-CM | POA: Diagnosis present

## 2019-01-14 DIAGNOSIS — Z85828 Personal history of other malignant neoplasm of skin: Secondary | ICD-10-CM | POA: Diagnosis not present

## 2019-01-14 DIAGNOSIS — I493 Ventricular premature depolarization: Secondary | ICD-10-CM | POA: Diagnosis present

## 2019-01-14 DIAGNOSIS — G8191 Hemiplegia, unspecified affecting right dominant side: Secondary | ICD-10-CM | POA: Diagnosis present

## 2019-01-14 DIAGNOSIS — N179 Acute kidney failure, unspecified: Secondary | ICD-10-CM

## 2019-01-14 DIAGNOSIS — Z20828 Contact with and (suspected) exposure to other viral communicable diseases: Secondary | ICD-10-CM | POA: Diagnosis present

## 2019-01-14 DIAGNOSIS — R29703 NIHSS score 3: Secondary | ICD-10-CM | POA: Diagnosis present

## 2019-01-14 DIAGNOSIS — R29810 Facial weakness: Secondary | ICD-10-CM | POA: Diagnosis present

## 2019-01-14 DIAGNOSIS — Z823 Family history of stroke: Secondary | ICD-10-CM | POA: Diagnosis not present

## 2019-01-14 DIAGNOSIS — J90 Pleural effusion, not elsewhere classified: Secondary | ICD-10-CM | POA: Diagnosis present

## 2019-01-14 LAB — CBG MONITORING, ED: Glucose-Capillary: 97 mg/dL (ref 70–99)

## 2019-01-14 LAB — CBC
HCT: 39.1 % (ref 39.0–52.0)
Hemoglobin: 14.3 g/dL (ref 13.0–17.0)
MCH: 30.8 pg (ref 26.0–34.0)
MCHC: 36.6 g/dL — ABNORMAL HIGH (ref 30.0–36.0)
MCV: 84.3 fL (ref 80.0–100.0)
Platelets: 197 10*3/uL (ref 150–400)
RBC: 4.64 MIL/uL (ref 4.22–5.81)
RDW: 12.5 % (ref 11.5–15.5)
WBC: 8.5 10*3/uL (ref 4.0–10.5)
nRBC: 0 % (ref 0.0–0.2)

## 2019-01-14 LAB — MAGNESIUM: Magnesium: 1.9 mg/dL (ref 1.7–2.4)

## 2019-01-14 LAB — CREATININE, SERUM
Creatinine, Ser: 1.07 mg/dL (ref 0.61–1.24)
GFR calc Af Amer: >60 mL/min (ref >60)
GFR calc non Af Amer: >60 mL/min (ref >60)

## 2019-01-14 LAB — SARS CORONAVIRUS 2 (TAT 6-24 HRS): SARS Coronavirus 2: NEGATIVE

## 2019-01-14 MED ORDER — IOHEXOL 350 MG/ML SOLN
100.0000 mL | Freq: Once | INTRAVENOUS | Status: AC | PRN
  Administered 2019-01-14: 100 mL via INTRAVENOUS

## 2019-01-14 MED ORDER — METOPROLOL TARTRATE 25 MG PO TABS
100.0000 mg | ORAL_TABLET | Freq: Once | ORAL | Status: AC
  Administered 2019-01-14: 100 mg via ORAL
  Filled 2019-01-14: qty 4

## 2019-01-14 MED ORDER — ASPIRIN 325 MG PO TABS
325.0000 mg | ORAL_TABLET | Freq: Once | ORAL | Status: AC
  Administered 2019-01-14: 325 mg via ORAL
  Filled 2019-01-14: qty 1

## 2019-01-14 MED ORDER — ACETAMINOPHEN 650 MG RE SUPP: 650.0000 mg | RECTAL | Status: DC | PRN

## 2019-01-14 MED ORDER — SENNOSIDES-DOCUSATE SODIUM 8.6-50 MG PO TABS: 1.0000 | ORAL_TABLET | Freq: Every evening | ORAL | Status: DC | PRN

## 2019-01-14 MED ORDER — STROKE: EARLY STAGES OF RECOVERY BOOK
Freq: Once | Status: AC
  Administered 2019-01-14: 18:00:00
  Filled 2019-01-14: qty 1

## 2019-01-14 MED ORDER — ACETAMINOPHEN 160 MG/5ML PO SOLN: 650.0000 mg | ORAL | Status: DC | PRN

## 2019-01-14 MED ORDER — SODIUM CHLORIDE 0.9 % IV SOLN
INTRAVENOUS | Status: DC
  Administered 2019-01-14: 18:00:00 via INTRAVENOUS

## 2019-01-14 MED ORDER — ATORVASTATIN CALCIUM 80 MG PO TABS
80.0000 mg | ORAL_TABLET | Freq: Every day | ORAL | Status: DC
  Administered 2019-01-14 – 2019-01-15 (×2): 80 mg via ORAL
  Filled 2019-01-14 (×2): qty 1

## 2019-01-14 MED ORDER — ENOXAPARIN SODIUM 40 MG/0.4ML ~~LOC~~ SOLN
40.0000 mg | SUBCUTANEOUS | Status: DC
  Administered 2019-01-14 – 2019-01-15 (×2): 40 mg via SUBCUTANEOUS
  Filled 2019-01-14 (×2): qty 0.4

## 2019-01-14 MED ORDER — LORAZEPAM 2 MG/ML IJ SOLN
1.0000 mg | Freq: Once | INTRAMUSCULAR | Status: AC
  Administered 2019-01-14: 1 mg via INTRAVENOUS
  Filled 2019-01-14: qty 1

## 2019-01-14 MED ORDER — ACETAMINOPHEN 325 MG PO TABS: 650.0000 mg | ORAL_TABLET | ORAL | Status: DC | PRN

## 2019-01-14 MED ORDER — POTASSIUM CHLORIDE CRYS ER 20 MEQ PO TBCR
40.0000 meq | EXTENDED_RELEASE_TABLET | Freq: Once | ORAL | Status: AC
  Administered 2019-01-14: 40 meq via ORAL
  Filled 2019-01-14: qty 2

## 2019-01-14 NOTE — ED Notes (Signed)
Neuro PA at bedside.  

## 2019-01-14 NOTE — ED Notes (Signed)
Dr. Daryll Drown paged to [phone] Lorrin Goodell, RN paged by Levada Dy

## 2019-01-14 NOTE — Discharge Instructions (Addendum)
In regards to your diet -   When determining what to eat remember "eat close to the ground" > fresh fruits, veggies, fish, lean meats.  If you have to choose something other than fresh vegetables, choose frozen as they have less salt.   Grill or bake instead of deep frying foods.   Limit fast food to one day per week (ease up on the [employer] visits).  Avoid fried foods.   Switch to whole grains / whole wheat.  Avoid white bread & pasta.  Ok for whole wheat pasta / bread.    Drink water or unsweetened drinks.  Avoid sugary cola or sweet tea.    In regards to exercise - Walk 30 minutes per day

## 2019-01-14 NOTE — Progress Notes (Signed)
  Echocardiogram 2D Echocardiogram has been performed.  Derrick Lawrence 01/14/2019, 5:36 PM

## 2019-01-14 NOTE — ED Notes (Signed)
Report given to Jana Half, RN on 3W

## 2019-01-14 NOTE — ED Provider Notes (Signed)
[facility] EMERGENCY DEPARTMENT Provider Note   CSN: [id number] Arrival date & time: 01/13/19  2149     History   Chief Complaint Chief Complaint  Patient presents with  . Weakness    HPI Derrick Lawrence is a 78 y.o. male.     Pt presents to the ED today with a possible stroke.  Pt's family said sx started around 28 pm yesterday.  Family said he could not hold his fork in his right hand and was falling to the right.  Family feels like his speech is a little off.  Unfortunately, pt waited in the waiting room for almost 11 hrs before he came back.  While out there, he did have a negative CT head at 2303.       Past Medical History:  Diagnosis Date  . ED (erectile dysfunction)   . Hyperlipidemia   . Hypertension     Patient Active Problem List   Diagnosis Date Noted  . HTN (hypertension) 07/03/2010  . ED (erectile dysfunction) 07/03/2010  . Hyperlipidemia 07/03/2010  . Allergic rhinitis 07/03/2010  . Diverticula of colon 07/03/2010  . Hemorrhoids, internal, without mention of complications [id number]    No past surgical history on file.      Home Medications    Prior to Admission medications   Medication Sig Start Date End Date Taking? Authorizing Provider  doxazosin (CARDURA) 8 MG tablet Take 1 tablet (8 mg total) by mouth at bedtime. 10/26/12   Lysbeth Penner, FNP  fluticasone (FLONASE) 50 MCG/ACT nasal spray Place 2 sprays into the nose daily. 10/12/12   Lysbeth Penner, FNP  hydrochlorothiazide (HYDRODIURIL) 25 MG tablet Take 1 tablet (25 mg total) by mouth daily. 10/12/12   Lysbeth Penner, FNP  metoprolol (LOPRESSOR) 100 MG tablet One po qd 10/12/12   Lysbeth Penner, FNP  pravastatin (PRAVACHOL) 40 MG tablet Take 40 mg by mouth daily.    [provider]  pravastatin (PRAVACHOL) 40 MG tablet Take 1 tablet (40 mg total) by mouth daily. 10/12/12   Lysbeth Penner, FNP  quinapril (ACCUPRIL) 40 MG tablet Take 1 tablet (40 mg  total) by mouth at bedtime. 10/12/12   Lysbeth Penner, FNP  sildenafil (VIAGRA) 100 MG tablet Take 0.5-1 tablets (50-100 mg total) by mouth daily as needed for erectile dysfunction. 10/12/12   Lysbeth Penner, FNP    Family History No family history on file.  Social History Social History   Tobacco Use  . Smoking status: Never Smoker  Substance Use Topics  . Alcohol use: No  . Drug use: No     Allergies   Fluticasone   Review of Systems Review of Systems  Neurological: Positive for weakness.  All other systems reviewed and are negative.    Physical Exam Updated Vital Signs BP (!) 177/83   Pulse 69   Temp 97.7 F (36.5 C) (Oral)   Resp 13   Ht 5\' 8"  (1.727 m)   Wt 86.2 kg   SpO2 99%   BMI 28.89 kg/m   Physical Exam Vitals signs and nursing note reviewed.  Constitutional:      Appearance: Normal appearance.  HENT:     Head: Normocephalic and atraumatic.     Right Ear: External ear normal.     Left Ear: External ear normal.     Nose: Nose normal.     Mouth/Throat:     Mouth: Mucous membranes are moist.  Pharynx: Oropharynx is clear.  Eyes:     Extraocular Movements: Extraocular movements intact.     Conjunctiva/sclera: Conjunctivae normal.     Pupils: Pupils are equal, round, and reactive to light.  Neck:     Musculoskeletal: Normal range of motion and neck supple.  Cardiovascular:     Rate and Rhythm: Normal rate and regular rhythm.     Pulses: Normal pulses.     Heart sounds: Normal heart sounds.  Pulmonary:     Effort: Pulmonary effort is normal.     Breath sounds: Normal breath sounds.  Abdominal:     General: Abdomen is flat. Bowel sounds are normal.     Palpations: Abdomen is soft.  Musculoskeletal: Normal range of motion.  Skin:    General: Skin is warm.     Capillary Refill: Capillary refill takes less than 2 seconds.  Neurological:     Mental Status: He is alert and oriented to person, place, and time.     Comments: Right sided  facial droop. Slight weakness right hand.  Psychiatric:        Mood and Affect: Mood normal.        Behavior: Behavior normal.        Thought Content: Thought content normal.        Judgment: Judgment normal.      ED Treatments / Results  Labs (all labs ordered are listed, but only abnormal results are displayed) Labs Reviewed  COMPREHENSIVE METABOLIC PANEL - Abnormal; Notable for the following components:      Result Value   Sodium 130 (*)    Potassium 2.8 (*)    Chloride 93 (*)    Glucose, Bld 126 (*)    Calcium 8.6 (*)    Total Protein 6.1 (*)    Albumin 3.2 (*)    All other components within normal limits  I-STAT CHEM 8, ED - Abnormal; Notable for the following components:   Sodium 130 (*)    Potassium 3.0 (*)    Chloride 89 (*)    Glucose, Bld 123 (*)    Calcium, Ion 1.04 (*)    All other components within normal limits  SARS CORONAVIRUS 2 (TAT 6-24 HRS)  PROTIME-INR  APTT  CBC  DIFFERENTIAL  MAGNESIUM  CBG MONITORING, ED    EKG EKG Interpretation  Date/Time:  Thursday January 13 2019 22:01:30 EDT Ventricular Rate:  70 PR Interval:  198 QRS Duration: 86 QT Interval:  386 QTC Calculation: 416 R Axis:   37 Text Interpretation:  Normal sinus rhythm Normal ECG Confirmed by Isla Pence [phone]) on 01/14/2019 7:56:29 AM   Radiology Ct Head Wo Contrast  Result Date: 01/13/2019 CLINICAL DATA:  Neuro deficit(s), subacute Possible stroke EXAM: CT HEAD WITHOUT CONTRAST TECHNIQUE: Contiguous axial images were obtained from the base of the skull through the vertex without intravenous contrast. COMPARISON:  None. FINDINGS: Brain: No intracranial hemorrhage, mass effect, or midline shift. Brain volume is normal for age. No hydrocephalus. The basilar cisterns are patent. Moderate periventricular chronic small vessel ischemia. No evidence of territorial infarct or acute ischemia. No extra-axial or intracranial fluid collection. Vascular: No hyperdense vessel. Skull:  No fracture or focal lesion. Sinuses/Orbits: No acute finding. Bilateral cataract resection. Other: None. IMPRESSION: 1. No acute intracranial abnormality. 2. Moderate chronic small vessel ischemia. Electronically Signed   By: Keith Rake M.D.   On: 01/13/2019 23:03   Mr Brain Wo Contrast  Result Date: 01/14/2019 CLINICAL DATA:  Could not hold fork  and right hand. EXAM: MRI HEAD WITHOUT CONTRAST TECHNIQUE: Multiplanar, multiecho pulse sequences of the brain and surrounding structures were obtained without intravenous contrast. COMPARISON:  Head CT from yesterday FINDINGS: Brain: Wedge of restricted diffusion in the left corona radiata measuring up to 12 mm in length. Background of confluent chronic small vessel ischemic gliosis in the cerebral white matter. Remote lacunar infarcts have occurred in the bilateral medial thalamus. Brain volume is normal for age. No acute hemorrhage, hydrocephalus, or masslike finding. Vascular: Normal flow voids Skull and upper cervical spine: No focal marrow lesion. Cervical facet spurring with left-sided C2-3 ankylosis. Sinuses/Orbits: Bilateral cataract resection IMPRESSION: 1. 12 mm acute infarct in the left corona radiata. 2. Advanced chronic small vessel ischemia. Electronically Signed   By: Monte Fantasia M.D.   On: 01/14/2019 11:59    Procedures Procedures (including critical care time)  Medications Ordered in ED Medications  aspirin tablet 325 mg (has no administration in time range)  metoprolol tartrate (LOPRESSOR) tablet 100 mg (100 mg Oral Given 01/14/19 0906)  potassium chloride SA (KLOR-CON) CR tablet 40 mEq (40 mEq Oral Given 01/14/19 0907)  LORazepam (ATIVAN) injection 1 mg (1 mg Intravenous Given 01/14/19 1005)     Initial Impression / Assessment and Plan / ED Course  I have reviewed the triage vital signs and the nursing notes.  Pertinent labs & imaging results that were available during my care of the patient were reviewed by me and  considered in my medical decision making (see chart for details).     Pt has not had any of his meds since yesterday morning and he is hypertensive.  He is given his morning dose of lopressor.  K and Na are low.  He is on HCTZ which is the likely cause.  Pt's MRI did show an acute CVA in the left corona radiata. Pt d/w Dr. Cheral Marker (neurology) who will see pt in consult.  Pt d/w Dr. Daryll Drown (triad) for admission.    Final Clinical Impressions(s) / ED Diagnoses   Final diagnoses:  Hyponatremia  Hypokalemia  Acute CVA (cerebrovascular accident) [facility])    ED Discharge Orders    None       Isla Pence, MD 01/14/19 1242

## 2019-01-14 NOTE — Consult Note (Addendum)
NEURO HOSPITALIST  CONSULT   Requesting Physician: Dr. Gilford Raid    Chief Complaint: Right sided weakness  History obtained from:  Patient    HPI:                                                                                                                                         Derrick Lawrence is an 78 y.o. male with a PMHx significant for HTN and HLD who presented to the [facility] ED with c/o right sided weakness.  Per patient: symptoms started about 12 pm 01/13/2019.  He was making some potato salad. When he went to taste it, he noticed that he could not quite grasp his fork and get his right hand to do what he wanted it to do. His wife witnessed the patient not being able to hold a fork in his right hand and he was falling to the right side. The family felt like his speech was a little off. Patient reported this to family about 7 pm last night and they then brought him to the ED to be evaluated for stroke. Denies any previous stroke history, CP,HA, vision changes, ETOH use, cigarette smoking or drug abuse. Patient denies taking a daily ASA or any blood thinners.    ED course:  CTH: no hemorrhage MRI: 12 mm acute infarct left corona radiata BP: Highest reading 213/104  BG: 123   Date last known well: 01/13/2019 Time last known well: 1200 tPA Given: No: outside of window  Modified Rankin: Rankin Score=1 NIHSS:3   Past Medical History:  Diagnosis Date  . ED (erectile dysfunction)   . Hyperlipidemia   . Hypertension     Past Surgical History:  Procedure Laterality Date  . CATARACT EXTRACTION Right   . HERNIA REPAIR     Abdominal  . SKIN CANCER EXCISION      Family History  Problem Relation Age of Onset  . Cancer - Lung Mother   . Stroke Son          Social History:  reports that he has never smoked. He does not have any smokeless tobacco history on file. He reports that he does not drink alcohol or use drugs.  Allergies:   Allergies  Allergen Reactions  . Fluticasone Other (See Comments)    failed    Medications:  Current Facility-Administered Medications  Medication Dose Route Frequency Provider Last Rate Last Dose  . aspirin tablet 325 mg  325 mg Oral Once Isla Pence, MD       Current Outpatient Medications  Medication Sig Dispense Refill  . doxazosin (CARDURA) 8 MG tablet Take 1 tablet (8 mg total) by mouth at bedtime. 90 tablet 3  . fluticasone (FLONASE) 50 MCG/ACT nasal spray Place 2 sprays into the nose daily. 1 g 11  . hydrochlorothiazide (HYDRODIURIL) 25 MG tablet Take 1 tablet (25 mg total) by mouth daily. 90 tablet 3  . metoprolol (LOPRESSOR) 100 MG tablet One po qd 90 tablet 3  . pravastatin (PRAVACHOL) 40 MG tablet Take 40 mg by mouth daily.    . pravastatin (PRAVACHOL) 40 MG tablet Take 1 tablet (40 mg total) by mouth daily. 90 tablet 3  . quinapril (ACCUPRIL) 40 MG tablet Take 1 tablet (40 mg total) by mouth at bedtime. 90 tablet 3  . sildenafil (VIAGRA) 100 MG tablet Take 0.5-1 tablets (50-100 mg total) by mouth daily as needed for erectile dysfunction. 5 tablet 11    ROS:                                                                                                                                       ROS was performed and is negative except as noted in HPI   General Examination:                                                                                                      Blood pressure (!) 177/83, pulse 69, temperature 97.7 F (36.5 C), temperature source Oral, resp. rate 13, height 5\' 8"  (1.727 m), weight 86.2 kg, SpO2 99 %.  HEENT-  Normocephalic, no lesions, without obvious abnormality.  Normal external eye and conjunctiva. Cardiovascular- , pulses palpable throughout  Lungs-, no excessive working breathing.  Saturations within normal limits on  RA Extremities- Warm, dry and intact Musculoskeletal-no joint tenderness, deformity or swelling Skin-warm and dry, no hyperpigmentation, vitiligo, or suspicious lesions  Neurological Examination Mental Status: Alert, oriented name/age/month/year/city  thought content appropriate.  Speech fluent without evidence of aphasia. Dysarthria noted. Able to follow  commands without difficulty. Cranial Nerves: II:  Visual fields grossly normal,  III,IV, VI: ptosis not present, extra-ocular motions intact bilaterally, pupils equal, round, reactive to light and accommodation V,VII: smile asymmetric right facial droop noted, facial light touch sensation normal bilaterally VIII: hearing normal bilaterally IX,X: uvula rises midline  XI: bilateral shoulder shrug XII: midline tongue extension Motor: Right : Upper extremity   4/5  Left:     Upper extremity   5/5  Lower extremity   4+/5  Lower extremity   5/5 Tone and bulk:normal tone throughout; no atrophy noted RUE movement affected more than RLE Sensory:  light touch intact throughout, bilaterally Deep Tendon Reflexes: Hypoactive upper extremity reflexes. Absent patellar reflexes Plantars: Right: downgoing   Left: downgoing Cerebellar: normal finger-to-nose in left side, unable to perform with RUE. Normal heel-to-shin test Gait: deferred   Lab Results: Basic Metabolic Panel: Recent Labs  Lab 01/13/19 2208 01/13/19 2217 01/14/19 0838  NA 130* 130*  --   K 2.8* 3.0*  --   CL 93* 89*  --   CO2 26  --   --   GLUCOSE 126* 123*  --   BUN 15 16  --   CREATININE 1.17 1.10  --   CALCIUM 8.6*  --   --   MG  --   --  1.9    CBC: Recent Labs  Lab 01/13/19 2208 01/13/19 2217  WBC 8.8  --   NEUTROABS 4.8  --   HGB 15.0 14.3  HCT 41.7 42.0  MCV 85.6  --   PLT 215  --     CBG: Recent Labs  Lab 01/14/19 0837  GLUCAP 97    Imaging: Ct Head Wo Contrast  Result Date: 01/13/2019 CLINICAL DATA:  Neuro deficit(s), subacute Possible  stroke EXAM: CT HEAD WITHOUT CONTRAST TECHNIQUE: Contiguous axial images were obtained from the base of the skull through the vertex without intravenous contrast. COMPARISON:  None. FINDINGS: Brain: No intracranial hemorrhage, mass effect, or midline shift. Brain volume is normal for age. No hydrocephalus. The basilar cisterns are patent. Moderate periventricular chronic small vessel ischemia. No evidence of territorial infarct or acute ischemia. No extra-axial or intracranial fluid collection. Vascular: No hyperdense vessel. Skull: No fracture or focal lesion. Sinuses/Orbits: No acute finding. Bilateral cataract resection. Other: None. IMPRESSION: 1. No acute intracranial abnormality. 2. Moderate chronic small vessel ischemia. Electronically Signed   By: Keith Rake M.D.   On: 01/13/2019 23:03   Mr Brain Wo Contrast  Result Date: 01/14/2019 CLINICAL DATA:  Could not hold fork and right hand. EXAM: MRI HEAD WITHOUT CONTRAST TECHNIQUE: Multiplanar, multiecho pulse sequences of the brain and surrounding structures were obtained without intravenous contrast. COMPARISON:  Head CT from yesterday FINDINGS: Brain: Wedge of restricted diffusion in the left corona radiata measuring up to 12 mm in length. Background of confluent chronic small vessel ischemic gliosis in the cerebral white matter. Remote lacunar infarcts have occurred in the bilateral medial thalamus. Brain volume is normal for age. No acute hemorrhage, hydrocephalus, or masslike finding. Vascular: Normal flow voids Skull and upper cervical spine: No focal marrow lesion. Cervical facet spurring with left-sided C2-3 ankylosis. Sinuses/Orbits: Bilateral cataract resection IMPRESSION: 1. 12 mm acute infarct in the left corona radiata. 2. Advanced chronic small vessel ischemia. Electronically Signed   By: Monte Fantasia M.D.   On: 01/14/2019 11:59    Laurey Morale, MSN, NP-C [facility] [phone] 01/14/2019, 12:25 PM     Assessment: 78 y.o. male with a PMHx significant for HTN and HLD who presented to the [facility] ED with c/o right sided weakness. 1. Head ct was negative for hemorrhage.  2. MRI revealed a 12 mm left corona radiata acute infarction.   3. Will need to be admitted for complete stroke work-up 4. Stroke  Risk Factors - HLD and HTN    Recommendations: -- BP goal : Permissive HTN up to 220/110 mmHg (for 24-48 hrs post admission ) -- CTA head and neck  -- Echocardiogram -- ASA -- High intensity Statin if LDL > 70 -- HgbA1c, fasting lipid panel -- PT consult, OT consult, Speech consult -- Telemetry monitoring -- Frequent neuro checks -- Stroke swallow screen  -- Please page stroke NP  Or  PA  Or MD from 8am -4 pm  as this patient from this time will be  followed by the stroke.   You can look them up on [email or web address]  Password [email or web address]  I have seen and examined the patient. I have discussed the assessment and recommendations with Laurey Morale, NP. 78 year old male with acute onset of right sided weakness. MRI reveals a small left corona radiata acute infarction. Exam consistent with location of the infarction. The patient has been admitted for stroke work up. ASA has been started.  Electronically signed: Dr. Kerney Elbe

## 2019-01-14 NOTE — H&P (Addendum)
History and Physical    Derrick Lawrence [id number] DOB: [birth year] DOA: 01/13/2019  PCP: Kathryne Eriksson per patient Patient coming from: Home  Chief Complaint: right hand difficulty  HPI: Derrick Lawrence is a 78 y.o. male with medical history significant of HTN, HLD, ED who presents for right hand weakness and right leg weakness.  He notes symptoms started at 1pm yesterday (10/22) when he was making potato salad.  He finished making it, but was unable to use a fork to eat it.  He notes difficulty getting his hand to work and also understanding how to use the fork.  He further noted right leg weakness and he has been nervous to put his weight on it.  He is a non smoker.  He has a son who had a stroke at 7 years old.  He reports taking his BP medications on the day of these events.   ED Course: In the ED, he had a CT scan which did not show anything concerning.  He had an MRI which showed a 44mm stroke in the corona radiata.  Na was 130, K was 2.8, Cr was 1.17, WBC 8.8.  Per Care Everywhere.  Na has been low since 2016 ranging from 131 - 133.  Cr baseline is closer to 0.9.  Magnesium was normal.  Neurology was consulted, recommendations pending.   Review of Systems: As per HPI otherwise 10 point review of systems negative.  He reports that prior to this issue, he was in his normal state of health.  BP normally runs in the 130s/90s.    Past Medical History:  Diagnosis Date   ED (erectile dysfunction)    H/O nonmelanoma skin cancer    Hyperlipidemia    Hypertension     Past Surgical History:  Procedure Laterality Date   CATARACT EXTRACTION Right    HERNIA REPAIR     Abdominal   SKIN CANCER EXCISION     Reviewed with patient.   reports that he has never smoked. He does not have any smokeless tobacco history on file. He reports that he does not drink alcohol or use drugs.  Allergies  Allergen Reactions   Fluticasone Other (See Comments)    failed   Reviewed with patient.   Family History  Problem Relation Age of Onset   Cancer - Lung Mother    Stroke Son     Prior to Admission medications   Medication Sig Start Date End Date Taking? Authorizing Provider  doxazosin (CARDURA) 8 MG tablet Take 1 tablet (8 mg total) by mouth at bedtime. 10/26/12   Lysbeth Penner, FNP  fluticasone (FLONASE) 50 MCG/ACT nasal spray Place 2 sprays into the nose daily. 10/12/12   Lysbeth Penner, FNP  hydrochlorothiazide (HYDRODIURIL) 25 MG tablet Take 1 tablet (25 mg total) by mouth daily. 10/12/12   Lysbeth Penner, FNP  metoprolol (LOPRESSOR) 100 MG tablet One po qd 10/12/12   Lysbeth Penner, FNP  pravastatin (PRAVACHOL) 40 MG tablet Take 40 mg by mouth daily.    [provider]  pravastatin (PRAVACHOL) 40 MG tablet Take 1 tablet (40 mg total) by mouth daily. 10/12/12   Lysbeth Penner, FNP  quinapril (ACCUPRIL) 40 MG tablet Take 1 tablet (40 mg total) by mouth at bedtime. 10/12/12   Lysbeth Penner, FNP  sildenafil (VIAGRA) 100 MG tablet Take 0.5-1 tablets (50-100 mg total) by mouth daily as needed for erectile dysfunction. 10/12/12   Lysbeth Penner, FNP  Physical Exam: Constitutional: NAD, calm, comfortable, lying in bed Vitals:   01/14/19 0906 01/14/19 0925 01/14/19 0945 01/14/19 1000  BP: (!) 211/98 (!) 154/106 (!) 186/85 (!) 177/83  Pulse: 69     Resp:  14 13 13   Temp:      TempSrc:      SpO2:      Weight:      Height:       Eyes: PERRL, lids normal, he has bilateral conjunctival injection, EOMI ENMT: Mucous membranes are mildly dry.  He has recent surgical scar to the upper nasal bridge, no drainage.  Neck: normal, supple, no carotid bruits Respiratory: Breathing comfortably, no wheezing Cardiovascular: RR, NR, no murmur noted Abdomen: Obese, NT, ND, +BS Musculoskeletal: No clubbing, normal muscle tone for age Skin: lesion on upper nose, ruddy appearance to skin throughout Neurologic: He had facial droop on the right, EOMI, decreased  shoulder shrug on the right.  3/5 strength of right upper extremity including grip strength,  4/5 strength of right lower extremity, worse with ankle dorsiflexion.  5/5 strength on the left.  He had normal sensation to light touch on upper and lower extremities.  Psychiatric: Normal judgment and insight. Alert and oriented x 3. Normal mood.   Labs on Admission: I have personally reviewed following labs and imaging studies  CBC: Recent Labs  Lab 01/13/19 2208 01/13/19 2217  WBC 8.8  --   NEUTROABS 4.8  --   HGB 15.0 14.3  HCT 41.7 42.0  MCV 85.6  --   PLT 215  --    Basic Metabolic Panel: Recent Labs  Lab 01/13/19 2208 01/13/19 2217 01/14/19 0838  NA 130* 130*  --   K 2.8* 3.0*  --   CL 93* 89*  --   CO2 26  --   --   GLUCOSE 126* 123*  --   BUN 15 16  --   CREATININE 1.17 1.10  --   CALCIUM 8.6*  --   --   MG  --   --  1.9   GFR: Estimated Creatinine Clearance: 61 mL/min (by C-G formula based on SCr of 1.1 mg/dL). Liver Function Tests: Recent Labs  Lab 01/13/19 2208  AST 18  ALT 18  ALKPHOS 78  BILITOT 0.6  PROT 6.1*  ALBUMIN 3.2*   No results for input(s): LIPASE, AMYLASE in the last 168 hours. No results for input(s): AMMONIA in the last 168 hours. Coagulation Profile: Recent Labs  Lab 01/13/19 2208  INR 1.0   Cardiac Enzymes: No results for input(s): CKTOTAL, CKMB, CKMBINDEX, TROPONINI in the last 168 hours. BNP (last 3 results) No results for input(s): PROBNP in the last 8760 hours. HbA1C: No results for input(s): HGBA1C in the last 72 hours. CBG: Recent Labs  Lab 01/14/19 0837  GLUCAP 97   Lipid Profile: No results for input(s): CHOL, HDL, LDLCALC, TRIG, CHOLHDL, LDLDIRECT in the last 72 hours. Thyroid Function Tests: No results for input(s): TSH, T4TOTAL, FREET4, T3FREE, THYROIDAB in the last 72 hours. Anemia Panel: No results for input(s): VITAMINB12, FOLATE, FERRITIN, TIBC, IRON, RETICCTPCT in the last 72 hours. Urine analysis: No  results found for: COLORURINE, APPEARANCEUR, LABSPEC, [place], GLUCOSEU, [place], [place], [place], PROTEINUR, UROBILINOGEN, NITRITE, LEUKOCYTESUR  Radiological Exams on Admission: Ct Head Wo Contrast  Result Date: 01/13/2019 CLINICAL DATA:  Neuro deficit(s), subacute Possible stroke EXAM: CT HEAD WITHOUT CONTRAST TECHNIQUE: Contiguous axial images were obtained from the base of the skull through the vertex without intravenous contrast. COMPARISON:  None. FINDINGS: Brain: No intracranial hemorrhage, mass effect, or midline shift. Brain volume is normal for age. No hydrocephalus. The basilar cisterns are patent. Moderate periventricular chronic small vessel ischemia. No evidence of territorial infarct or acute ischemia. No extra-axial or intracranial fluid collection. Vascular: No hyperdense vessel. Skull: No fracture or focal lesion. Sinuses/Orbits: No acute finding. Bilateral cataract resection. Other: None. IMPRESSION: 1. No acute intracranial abnormality. 2. Moderate chronic small vessel ischemia. Electronically Signed   By: Keith Rake M.D.   On: 01/13/2019 23:03   Mr Brain Wo Contrast  Result Date: 01/14/2019 CLINICAL DATA:  Could not hold fork and right hand. EXAM: MRI HEAD WITHOUT CONTRAST TECHNIQUE: Multiplanar, multiecho pulse sequences of the brain and surrounding structures were obtained without intravenous contrast. COMPARISON:  Head CT from yesterday FINDINGS: Brain: Wedge of restricted diffusion in the left corona radiata measuring up to 12 mm in length. Background of confluent chronic small vessel ischemic gliosis in the cerebral white matter. Remote lacunar infarcts have occurred in the bilateral medial thalamus. Brain volume is normal for age. No acute hemorrhage, hydrocephalus, or masslike finding. Vascular: Normal flow voids Skull and upper cervical spine: No focal marrow lesion. Cervical facet spurring with left-sided C2-3 ankylosis. Sinuses/Orbits: Bilateral cataract  resection IMPRESSION: 1. 12 mm acute infarct in the left corona radiata. 2. Advanced chronic small vessel ischemia. Electronically Signed   By: Monte Fantasia M.D.   On: 01/14/2019 11:59    EKG: Independently reviewed. NSR, HR of 70, Pwaves present.  No ST or TW changes.    Assessment/Plan Stroke (cerebrum), left corona radiata with chronic small vessel disease - Neurology following, will plan to follow up recommendations - MRI as above - Check Lipid panel, A1C, CXR - Hold BP medications to allow for permissive HTN - TTE - Carotid dopplers, or other modality to evaluate carotid arteries based on neuro recommendations --> reviewed NP note which recommended CTA head and neck, will order and cancel CD - Passed bedside swallow in the ED, regular diet provided - PT/OT for evaluation - Telemetry - Aspirin 325 ordered  Hypokalemia and Hypocalcemia - Replace K orally, 40 meq given in the ED - Cal corrected to 9.2 - Recheck K in the AM - Telemetry  Hyponatremia, chronic, mild - Monitor - Appears to be chronic since 2016 - Given mild in severity, will just monitor  Increased Cr, mild AKI - IVF with NS at 50cc/hr for 12 hours then stop - Repeat BMET in the AM    HTN (hypertension) - Holding home medications to allow for permissive HTN - Grandson will bring in bottles to confirm doses    Hyperlipidemia - Change to high intensity statin, atorvastatin  H/O skin cancer - Monitor site of recent surgery  H/O ED - Hold sildenafil    DVT prophylaxis: Lovenox  Code Status: Full  Family Communication: Grandson at bedside Disposition Plan: Admit for further owrk up Consults called: Neurology, [place]  Admission status: Obs, Med Tele    Gilles Chiquito MD [facility]  If 7PM-7AM, please contact night-coverage [email or web address] Password [email or web address]  01/14/2019, 1:42 PM

## 2019-01-14 NOTE — ED Notes (Signed)
ED TO INPATIENT HANDOFF REPORT  ED Nurse Name and Phone #: Lorrin Goodell [phone]  S Name/Age/Gender Derrick Lawrence 78 y.o. male Room/Bed: H019C/H019C  Code Status   Code Status: Not on file  Home/SNF/Other Home Patient oriented to: self, place, time and situation Is this baseline? Yes   Triage Complete: Triage complete  Chief Complaint Weakness (Right  Triage Note Pt states that his family thinks he has had a stroke. States when he sat down to eat he could not hold his fork in his right hand, and was "falling" to the right. This occurred about 12p.m. this afternoon.    Allergies No Known Allergies  Level of Care/Admitting Diagnosis ED Disposition    ED Disposition Condition Comment   Admit  Hospital Area: [facility] [[id number]]  Level of Care: Telemetry Medical [104]  Covid Evaluation: Asymptomatic Screening Protocol (No Symptoms)  Diagnosis: Stroke (cerebrum) ([facility]) [id number]  Admitting Physician: Sid Falcon [phone]  Attending Physician: Sid Falcon [[id number]  PT Class (Do Not Modify): Observation [104]  PT Acc Code (Do Not Modify): Observation [10022]       B Medical/Surgery History Past Medical History:  Diagnosis Date  . ED (erectile dysfunction)   . H/O nonmelanoma skin cancer   . Hyperlipidemia   . Hypertension    Past Surgical History:  Procedure Laterality Date  . CATARACT EXTRACTION Right   . HERNIA REPAIR     Abdominal  . SKIN CANCER EXCISION       A IV Location/Drains/Wounds Patient Lines/Drains/Airways Status   Active Line/Drains/Airways    Name:   Placement date:   Placement time:   Site:   Days:   Peripheral IV 01/14/19 Left Antecubital   01/14/19    0920    Antecubital   less than 1          Intake/Output Last 24 hours No intake or output data in the 24 hours ending 01/14/19 1555  Labs/Imaging Results for orders placed or performed during the hospital encounter of 01/13/19 (from the past 48 hour(s))  Protime-INR      Status: None   Collection Time: 01/13/19 10:08 PM  Result Value Ref Range   Prothrombin Time 12.9 11.4 - 15.2 seconds   INR 1.0 0.8 - 1.2    Comment: (NOTE) INR goal varies based on device and disease states. Performed at [facility], [place] [address], [place], [place] [address]   APTT     Status: None   Collection Time: 01/13/19 10:08 PM  Result Value Ref Range   aPTT 33 24 - 36 seconds    Comment: Performed at [facility] [address], [place] [address]  CBC     Status: None   Collection Time: 01/13/19 10:08 PM  Result Value Ref Range   WBC 8.8 4.0 - 10.5 K/uL   RBC 4.87 4.22 - 5.81 MIL/uL   Hemoglobin 15.0 13.0 - 17.0 g/dL   HCT 41.7 39.0 - 52.0 %   MCV 85.6 80.0 - 100.0 fL   MCH 30.8 26.0 - 34.0 pg   MCHC 36.0 30.0 - 36.0 g/dL   RDW 12.4 11.5 - 15.5 %   Platelets 215 150 - 400 K/uL   nRBC 0.0 0.0 - 0.2 %    Comment: Performed at [facility], [place] [address], [place], [place] [address]  Differential     Status: None   Collection Time: 01/13/19 10:08 PM  Result Value Ref  Range   Neutrophils Relative % 53 %   Neutro Abs 4.8 1.7 - 7.7 K/uL   Lymphocytes Relative 33 %   Lymphs Abs 2.9 0.7 - 4.0 K/uL   Monocytes Relative 9 %   Monocytes Absolute 0.8 0.1 - 1.0 K/uL   Eosinophils Relative 3 %   Eosinophils Absolute 0.2 0.0 - 0.5 K/uL   Basophils Relative 1 %   Basophils Absolute 0.1 0.0 - 0.1 K/uL   Immature Granulocytes 1 %   Abs Immature Granulocytes 0.04 0.00 - 0.07 K/uL    Comment: Performed at [facility] [address], [place], [place] [address]  Comprehensive metabolic panel     Status: Abnormal   Collection Time: 01/13/19 10:08 PM  Result Value Ref Range   Sodium 130 (L) 135 - 145 mmol/L   Potassium 2.8 (L) 3.5 - 5.1 mmol/L   Chloride 93 (L) 98 - 111 mmol/L   CO2 26 22 - 32 mmol/L   Glucose, Bld 126 (H) 70 - 99 mg/dL   BUN 15 8 - 23 mg/dL   Creatinine, Ser 1.17 0.61 - 1.24 mg/dL   Calcium 8.6 (L) 8.9 - 10.3 mg/dL    Total Protein 6.1 (L) 6.5 - 8.1 g/dL   Albumin 3.2 (L) 3.5 - 5.0 g/dL   AST 18 15 - 41 U/L   ALT 18 0 - 44 U/L   Alkaline Phosphatase 78 38 - 126 U/L   Total Bilirubin 0.6 0.3 - 1.2 mg/dL   GFR calc non Af Amer >60 >60 mL/min   GFR calc Af Amer >60 >60 mL/min   Anion gap 11 5 - 15    Comment: Performed at [facility], [place] [address], [place], [place] [address]  I-stat chem 8, ED     Status: Abnormal   Collection Time: 01/13/19 10:17 PM  Result Value Ref Range   Sodium 130 (L) 135 - 145 mmol/L   Potassium 3.0 (L) 3.5 - 5.1 mmol/L   Chloride 89 (L) 98 - 111 mmol/L   BUN 16 8 - 23 mg/dL   Creatinine, Ser 1.10 0.61 - 1.24 mg/dL   Glucose, Bld 123 (H) 70 - 99 mg/dL   Calcium, Ion 1.04 (L) 1.15 - 1.40 mmol/L   TCO2 29 22 - 32 mmol/L   Hemoglobin 14.3 13.0 - 17.0 g/dL   HCT 42.0 39.0 - 52.0 %  CBG monitoring, ED     Status: None   Collection Time: 01/14/19  8:37 AM  Result Value Ref Range   Glucose-Capillary 97 70 - 99 mg/dL   Comment 1 Notify RN    Comment 2 Document in Chart   Magnesium     Status: None   Collection Time: 01/14/19  8:38 AM  Result Value Ref Range   Magnesium 1.9 1.7 - 2.4 mg/dL    Comment: Performed at [facility], [place] [address], [place], [place] [address]   Ct Head Wo Contrast  Result Date: 01/13/2019 CLINICAL DATA:  Neuro deficit(s), subacute Possible stroke EXAM: CT HEAD WITHOUT CONTRAST TECHNIQUE: Contiguous axial images were obtained from the base of the skull through the vertex without intravenous contrast. COMPARISON:  None. FINDINGS: Brain: No intracranial hemorrhage, mass effect, or midline shift. Brain volume is normal for age. No hydrocephalus. The basilar cisterns are patent. Moderate periventricular chronic small vessel ischemia. No evidence of territorial infarct or acute ischemia. No extra-axial or intracranial fluid collection. Vascular: No hyperdense vessel. Skull: No fracture or focal  lesion. Sinuses/Orbits: No acute finding.  Bilateral cataract resection. Other: None. IMPRESSION: 1. No acute intracranial abnormality. 2. Moderate chronic small vessel ischemia. Electronically Signed   By: Keith Rake M.D.   On: 01/13/2019 23:03   Mr Brain Wo Contrast  Result Date: 01/14/2019 CLINICAL DATA:  Could not hold fork and right hand. EXAM: MRI HEAD WITHOUT CONTRAST TECHNIQUE: Multiplanar, multiecho pulse sequences of the brain and surrounding structures were obtained without intravenous contrast. COMPARISON:  Head CT from yesterday FINDINGS: Brain: Wedge of restricted diffusion in the left corona radiata measuring up to 12 mm in length. Background of confluent chronic small vessel ischemic gliosis in the cerebral white matter. Remote lacunar infarcts have occurred in the bilateral medial thalamus. Brain volume is normal for age. No acute hemorrhage, hydrocephalus, or masslike finding. Vascular: Normal flow voids Skull and upper cervical spine: No focal marrow lesion. Cervical facet spurring with left-sided C2-3 ankylosis. Sinuses/Orbits: Bilateral cataract resection IMPRESSION: 1. 12 mm acute infarct in the left corona radiata. 2. Advanced chronic small vessel ischemia. Electronically Signed   By: Monte Fantasia M.D.   On: 01/14/2019 11:59    Pending Labs Unresulted Labs (From admission, onward)    Start     Ordered   01/21/19 0500  Creatinine, serum  (enoxaparin (LOVENOX)    CrCl >/= 30 ml/min)  Weekly,   R    Comments: while on enoxaparin therapy    01/14/19 1500   01/15/19 0500  Hemoglobin A1c  Tomorrow morning,   R     01/14/19 1500   01/14/19 1205  SARS CORONAVIRUS 2 (TAT 6-24 HRS) Nasopharyngeal Nasopharyngeal Swab  (Asymptomatic/Tier 2 Patients Labs)  Once,   STAT    Question Answer Comment  Is this test for diagnosis or screening Screening   Symptomatic for COVID-19 as defined by CDC No   Hospitalized for COVID-19 No   Admitted to ICU for COVID-19 No   Previously tested for COVID-19 No   Resident in a  congregate (group) care setting No   Employed in healthcare setting No      01/14/19 1204   Signed and Held  Lipid panel  Tomorrow morning,   R    Comments: Fasting    Signed and Held   Signed and Held  CBC  (enoxaparin (LOVENOX)    CrCl >/= 30 ml/min)  Once,   R    Comments: Baseline for enoxaparin therapy IF NOT ALREADY DRAWN.  Notify MD if PLT < 100 K.    Signed and Held   Signed and Held  Creatinine, serum  (enoxaparin (LOVENOX)    CrCl >/= 30 ml/min)  Once,   R    Comments: Baseline for enoxaparin therapy IF NOT ALREADY DRAWN.    Signed and Held          Vitals/Pain Today's Vitals   01/14/19 1226 01/14/19 1355 01/14/19 1457 01/14/19 1520  BP:    (!) 165/78  Pulse:    64  Resp:    18  Temp:      TempSrc:      SpO2:    97%  Weight:      Height:      PainSc: 0-No pain 0-No pain 0-No pain     Isolation Precautions No active isolations  Medications Medications  acetaminophen (TYLENOL) tablet 650 mg (has no administration in time range)    Or  acetaminophen (TYLENOL) 160 MG/5ML solution 650 mg (has no administration in time range)    Or  acetaminophen (TYLENOL) suppository 650 mg (has no administration in time range)  senna-docusate (Senokot-S) tablet 1 tablet (has no administration in time range)  atorvastatin (LIPITOR) tablet 80 mg (has no administration in time range)  metoprolol tartrate (LOPRESSOR) tablet 100 mg (100 mg Oral Given 01/14/19 0906)  potassium chloride SA (KLOR-CON) CR tablet 40 mEq (40 mEq Oral Given 01/14/19 0907)  LORazepam (ATIVAN) injection 1 mg (1 mg Intravenous Given 01/14/19 1005)  aspirin tablet 325 mg (325 mg Oral Given 01/14/19 1507)    Mobility walks Moderate fall risk   Focused Assessments Neuro Assessment Handoff:  Swallow screen pass? Yes  Cardiac Rhythm: Normal sinus rhythm NIH Stroke Scale ( + Modified Stroke Scale Criteria)  Level of Consciousness (1a.)   : Alert, keenly responsive LOC Questions (1b. )   +: Answers  both questions correctly LOC Commands (1c. )   + : Performs both tasks correctly Best Gaze (2. )  +: Normal Visual (3. )  +: No visual loss Facial Palsy (4. )    : Minor paralysis Motor Arm, Left (5a. )   +: No drift Motor Arm, Right (5b. )   +: No drift Motor Leg, Left (6a. )   +: No drift Motor Leg, Right (6b. )   +: Drift Limb Ataxia (7. ): Absent Sensory (8. )   +: Normal, no sensory loss Best Language (9. )   +: No aphasia Dysarthria (10. ): Mild-to-moderate dysarthria, patient slurs at least some words and, at worst, can be understood with some difficulty Extinction/Inattention (11.)   +: No Abnormality Modified SS Total  +: 1 Complete NIHSS TOTAL: 2 Last date known well: 01/13/19 Last time known well: 1200 Neuro Assessment: Exceptions to WDL Neuro Checks:      Last Documented NIHSS Modified Score: 1 (01/14/19 1456) Has TPA been given? No If patient is a Neuro Trauma and patient is going to OR before floor call report to [facility] nurse: [phone] or [phone]     R Recommendations: See Admitting Provider Note  Report given to:   Additional Notes:

## 2019-01-14 NOTE — ED Notes (Signed)
Patient transported to MRI 

## 2019-01-15 DIAGNOSIS — I10 Essential (primary) hypertension: Secondary | ICD-10-CM

## 2019-01-15 LAB — LIPID PANEL
Cholesterol: 144 mg/dL (ref 0–200)
HDL: 33 mg/dL — ABNORMAL LOW (ref >40)
LDL Cholesterol: 86 mg/dL (ref 0–99)
Total CHOL/HDL Ratio: 4.4 RATIO
Triglycerides: 123 mg/dL (ref <150)
VLDL: 25 mg/dL (ref 0–40)

## 2019-01-15 LAB — ECHOCARDIOGRAM COMPLETE
Height: 68 in
Weight: 3040 oz

## 2019-01-15 LAB — HEMOGLOBIN A1C
Hgb A1c MFr Bld: 5.7 % — ABNORMAL HIGH (ref 4.8–5.6)
Mean Plasma Glucose: 116.89 mg/dL

## 2019-01-15 MED ORDER — METOPROLOL SUCCINATE ER 100 MG PO TB24
100.0000 mg | ORAL_TABLET | Freq: Every day | ORAL | Status: DC
  Administered 2019-01-15: 100 mg via ORAL
  Filled 2019-01-15: qty 1

## 2019-01-15 MED ORDER — POTASSIUM CHLORIDE CRYS ER 20 MEQ PO TBCR
30.0000 meq | EXTENDED_RELEASE_TABLET | Freq: Two times a day (BID) | ORAL | Status: DC
  Administered 2019-01-15 (×2): 30 meq via ORAL
  Filled 2019-01-15 (×3): qty 1

## 2019-01-15 MED ORDER — ASPIRIN 325 MG PO TABS
325.0000 mg | ORAL_TABLET | Freq: Every day | ORAL | Status: DC
  Administered 2019-01-15 – 2019-01-16 (×2): 325 mg via ORAL
  Filled 2019-01-15 (×2): qty 1

## 2019-01-15 NOTE — Evaluation (Addendum)
Speech Language Pathology Evaluation Patient Details Name: Derrick Lawrence MRN: [id number] DOB: 04-06-1940 Today's Date: 01/15/2019 Time: [id number] SLP Time Calculation (min) (ACUTE ONLY): 25 min  Problem List:  Patient Active Problem List   Diagnosis Date Noted  . Acute CVA (cerebrovascular accident) ([facility]) 01/14/2019  . CVA (cerebral vascular accident) ([facility]) 01/14/2019  . Hypokalemia   . Hyponatremia   . HTN (hypertension) 07/03/2010  . ED (erectile dysfunction) 07/03/2010  . Hyperlipidemia 07/03/2010  . Allergic rhinitis 07/03/2010  . Diverticula of colon 07/03/2010  . Hemorrhoids, internal, without mention of complications [id number]   Past Medical History:  Past Medical History:  Diagnosis Date  . ED (erectile dysfunction)   . H/O nonmelanoma skin cancer   . Hyperlipidemia   . Hypertension    Past Surgical History:  Past Surgical History:  Procedure Laterality Date  . CATARACT EXTRACTION Right   . HERNIA REPAIR     Abdominal  . SKIN CANCER EXCISION     HPI:  Derrick Lawrence is a 78 y.o. male with medical history significant of HTN, HLD, ED who presents for right hand weakness and right leg weakness. MRI revealed L corona radiata infarct.    Assessment / Plan / Recommendation Clinical Impression  Pt was seen for a cognitive-linguistic evaluation in the setting of a L corona radiata infarct.  Pt reported that he lives with his wife and that he is responsible for their financial and medication management at home.  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  Pt completed the Cognistat which revealed mild-moderate short-term memory deficits and mild deficits in calculations; however, pt answered informal money and time management problem solving questions with 100% accuracy independently.  Pt presents with functional attention, safety judgment, reasoning, and expressive/receptive language abilities.  Pt's grandson was in the room and he and  the pt both endorsed that the pt's speech and cognitive-linguistic abilities were at baseline.  Oral mechanism exam was unremarkable and pt's speech was >95% intelligible to an unfamiliar listener.  No further acute ST is warranted and no additional ST is recommended at time of discharge; however, recommend supervision with IADLs (medications, finances, etc.) when pt is first discharged.   Please re-consult if additional needs arise.      SLP Assessment  SLP Recommendation/Assessment: Patient does not need any further Speech Lanaguage Pathology Services SLP Visit Diagnosis: Cognitive communication deficit (R41.841)    Follow Up Recommendations  None    Frequency and Duration           SLP Evaluation Cognition  Overall Cognitive Status: Within Functional Limits for tasks assessed Arousal/Alertness: Awake/alert Orientation Level: Oriented X4 Attention: Sustained Sustained Attention: Appears intact Memory: Impaired Memory Impairment: Decreased short term memory;Retrieval deficit Decreased Short Term Memory: Verbal basic Awareness: Appears intact Problem Solving: Appears intact Executive Function: Reasoning Reasoning: Appears intact Safety/Judgment: Appears intact       Comprehension  Auditory Comprehension Overall Auditory Comprehension: Appears within functional limits for tasks assessed    Expression Expression Primary Mode of Expression: Verbal Verbal Expression Overall Verbal Expression: Appears within functional limits for tasks assessed Written Expression Dominant Hand: Right   Oral / Motor  Oral Motor/Sensory Function Overall Oral Motor/Sensory Function: Within functional limits Motor Speech Overall Motor Speech: Appears within functional limits for tasks assessed Intelligibility: Derrick Lawrence, M.S., [place] Office: [phone]  Derrick Lawrence 01/15/2019, 3:09 PM

## 2019-01-15 NOTE — Progress Notes (Addendum)
PROGRESS NOTE    Derrick Lawrence  MVH:[id number] DOB: 1940-09-17 DOA: 01/13/2019 PCP: Timmothy Euler, MD (Inactive)      Brief Narrative:  Derrick Lawrence is a 78 y.o. M, former smoker (age [age], 1ppd), with PMH of HTN, HLD who presented 10/23 with right hand and leg weakness that began on 10/22 at 1pm.  He had difficulty finishing making his lunch and was unable to put weight on his left leg.  Work up included a CT of the head which was negative.  Subsequent MRI demonstrated a 84m acute infarct in the left corona radiata.  Labs notable for hyponatremia (range 131-133 since 2016).     Assessment & Plan:  Acute CVA Initial symptoms of right hand, leg weakness. 169mstroke affecting the corona radiata.   MRI with 1239mcute infarct in the left corona radiata.  Cholesterol 144, HDL 33, LDL 86, Trig 123, VLDL 25.  Stroke risk factors = HTN, HLD.   -Daily aspirin 325 mg -Lipids > lipitor 57m36m,  hemoglobin A1c 5.7, no indication for glycemic control  -Carotid imaging > CTA head / neck without significant occlusion  -Echocardiogram completed, results pending  -PT/OT/SLP have been consulted  -Consult to Neurology, appreciate recommendations -Permissive hypertension for now -VTE prophylaxis ordered at time of admission -Atrial fibrillation: n/a, SR with few PVC's -tPA not given because of out of window  -Dysphagia screen ordered in ER, passed for regular diet  -Smoking cessation - not applicable   Hyponatremia  Chronic, range 131-133 since 2016, asymptomatic -trend Na -monitor, no acute interventions   Hypokalemia Pseudohypocalcemia  -monitor electrolytes & replace as indicated  Hypertension  Discussed with neurology -Resumehome Toprol-XL 100 mg QD -Hold home HCTZ, quinapril, doxazosin -follow BP trend   Chronic Right Should Pain  [facility] with PCP or Ortho       DVT prophylaxis:  enoxaparin  Code Status: Full Code  Family Communication: Patient, grandson updated at bedside on plan of care. Potential discharge 10/24.     Consultants:   Neurology   Procedures:   CT Head 10/22 >> no acute intracranial abnormality, moderate chronic small vessel ischemia  MRI Brain 10/22 >> 12mm52mte infarct in the left corona radiata, advanced chronic small vessel ischemia  CTA Head / Neck 10/22 >> negative CTA for LVO, mild atheromatous change involving the major arterial vasculature of the head and neck without hemodynamically significant or correctable stenosis, small layering right pleural effusion   ECHO 10/13 >>   Antimicrobials:      Subjective: Pt reports he feels better, right arm remains weak.  States he feels his chronic shoulder pain will be a barrier to rehab for his right arm.  No new speech deficit, focal weakness.  Appetite good.  No fever.  Objective: Vitals:   01/14/19 1626 01/14/19 1953 01/14/19 2345 01/15/19 0341  BP: (!) 169/81 (!) 159/92 (!) 169/85 (!) 186/91  Pulse: 63 70 72 72  Resp: _0 Temp: (!) 97.5 F (36.4 C) 97.9 F (36.6 C) 98 F (36.7 C) 97.6 F (36.4 C)  TempSrc: Oral Oral Oral Oral  SpO2: 99% 99% 98% 98%  Weight:      Height:        Intake/Output Summary (Last 24 hours) at 01/15/2019 0701 Last data filed at 01/15/2019 0600 Gross per 24 hour  Intake 522.16 ml  Output 700 ml  Net -177.84 ml  Filed Weights   01/13/19 2201  Weight: 86.2 kg    Examination: General appearance: elderly adult male, in NAD, sitting in recliner, eating a big Mac HEENT: Anicteric, conjunctiva pink, lids and lashes normal. No nasal deformity, discharge, epistaxis.  Lips moist, dentition in poor repair. Left lateral upper nose pink lesion (s/p outpatient biopsy) with mild erythema, no drainage.  Hearing intact Skin: Warm and dry, no suspicious rashes or lesions Cardiac: RRR, no murmurs, JVP normal, no LE edema. Respiratory: Normal  respiratory rate and rhythm, lungs clear without rales or wheezes. Abdomen:    MSK: No deformities or effusions. Neuro: Awake and alert, extraocular movements intact moves all extremities, 4/5 strength in the right upper extremity, bilateral lower extremity SCDs 5 5, facial droop, speech fluent.    Psych: Sensorium intact responding to questions, attention normal, affect normal, judgment insight appear normal.    Data Reviewed: I have personally reviewed following labs and imaging studies:  CBC: Recent Labs  Lab 01/13/19 2208 01/13/19 2217 01/14/19 1811  WBC 8.8  --  8.5  NEUTROABS 4.8  --   --   HGB 15.0 14.3 14.3  HCT 41.7 42.0 39.1  MCV 85.6  --  84.3  PLT 215  --  824   Basic Metabolic Panel: Recent Labs  Lab 01/13/19 2208 01/13/19 2217 01/14/19 0838 01/14/19 1811  NA 130* 130*  --   --   K 2.8* 3.0*  --   --   CL 93* 89*  --   --   CO2 26  --   --   --   GLUCOSE 126* 123*  --   --   BUN 15 16  --   --   CREATININE 1.17 1.10  --  1.07  CALCIUM 8.6*  --   --   --   MG  --   --  1.9  --    GFR: Estimated Creatinine Clearance: 62.7 mL/min (by C-G formula based on SCr of 1.07 mg/dL). Liver Function Tests: Recent Labs  Lab 01/13/19 2208  AST 18  ALT 18  ALKPHOS 78  BILITOT 0.6  PROT 6.1*  ALBUMIN 3.2*   No results for input(s): LIPASE, AMYLASE in the last 168 hours. No results for input(s): AMMONIA in the last 168 hours. Coagulation Profile: Recent Labs  Lab 01/13/19 2208  INR 1.0   Cardiac Enzymes: No results for input(s): CKTOTAL, CKMB, CKMBINDEX, TROPONINI in the last 168 hours. BNP (last 3 results) No results for input(s): PROBNP in the last 8760 hours. HbA1C: Recent Labs    01/15/19 0256  HGBA1C 5.7*   CBG: Recent Labs  Lab 01/14/19 0837  GLUCAP 97   Lipid Profile: Recent Labs    01/15/19 0256  CHOL 144  HDL 33*  LDLCALC 86  TRIG 123  CHOLHDL 4.4   Thyroid Function Tests: No results for input(s): TSH, T4TOTAL, FREET4, T3FREE,  THYROIDAB in the last 72 hours. Anemia Panel: No results for input(s): VITAMINB12, FOLATE, FERRITIN, TIBC, IRON, RETICCTPCT in the last 72 hours. Urine analysis: No results found for: COLORURINE, APPEARANCEUR, LABSPEC, PHURINE, GLUCOSEU, HGBUR, BILIRUBINUR, KETONESUR, PROTEINUR, UROBILINOGEN, NITRITE, LEUKOCYTESUR Sepsis Labs: _0 (procalcitonin:4,lacticacidven:4)  ) Recent Results (from the past 240 hour(s))  SARS CORONAVIRUS 2 (TAT 6-24 HRS) Nasopharyngeal Nasopharyngeal Swab     Status: None   Collection Time: 01/14/19  1:55 PM   Specimen: Nasopharyngeal Swab  Result Value Ref Range Status   SARS Coronavirus 2 NEGATIVE NEGATIVE Final    Comment: (NOTE) SARS-CoV-2  target nucleic acids are NOT DETECTED. The SARS-CoV-2 RNA is generally detectable in upper and lower respiratory specimens during the acute phase of infection. Negative results do not preclude SARS-CoV-2 infection, do not rule out co-infections with other pathogens, and should not be used as the sole basis for treatment or other patient management decisions. Negative results must be combined with clinical observations, patient history, and epidemiological information. The expected result is Negative. Fact Sheet for Patients: SugarRoll.be Fact Sheet for Healthcare Providers: https://www.woods-mathews.com/ This test is not yet approved or cleared by the [place] FDA and  has been authorized for detection and/or diagnosis of SARS-CoV-2 by FDA under an Emergency Use Authorization (EUA). This EUA will remain  in effect (meaning this test can be used) for the duration of the COVID-19 declaration under Section 56 4(b)(1) of the Act, 21 U.S.C. section 360bbb-3(b)(1), unless the authorization is terminated or revoked sooner. Performed at [facility], [place] [address] [place], [place] [address]          Radiology Studies: Ct Angio Head W Or Wo Contrast  Result  Date: 01/14/2019 CLINICAL DATA:  Follow-up examination for acute stroke, right-sided weakness. EXAM: CT ANGIOGRAPHY HEAD AND NECK TECHNIQUE: Multidetector CT imaging of the head and neck was performed using the standard protocol during bolus administration of intravenous contrast. Multiplanar CT image reconstructions and MIPs were obtained to evaluate the vascular anatomy. Carotid stenosis measurements (when applicable) are obtained utilizing NASCET criteria, using the distal internal carotid diameter as the denominator. CONTRAST:  118m OMNIPAQUE IOHEXOL 350 MG/ML SOLN COMPARISON:  Prior CT and MRI from 01/13/2019 and 01/14/2019. FINDINGS: CTA NECK FINDINGS Aortic arch: Visualized aortic arch of normal caliber with normal branch pattern. Mild-to-moderate atherosclerotic change within the arch itself. No hemodynamically significant stenosis about the origin of the great vessels. Visualized subclavian arteries widely patent. Right carotid system: Right common and internal carotid arteries mildly tortuous but are widely patent to the skull base without stenosis, dissection or occlusion. No significant atheromatous narrowing seen about the right carotid bifurcation. Left carotid system: Left common and internal carotid arteries are mildly tortuous but are widely patent to the skull base without stenosis, dissection, or occlusion. No significant atheromatous narrowing about the left carotid bifurcation. Vertebral arteries: Left vertebral artery arises directly from the aortic arch. Right vertebral artery rises from the right subclavian artery. Mild plaque at the origin of the right vertebral artery without significant stenosis. Vertebral arteries otherwise widely patent within the neck without stenosis, dissection, or occlusion. Skeleton: No acute osseous finding. No discrete lytic or blastic osseous lesions. Bulky anterior osteophytic spurring noted at C4-5-C6-7. Moderate to advanced multilevel facet arthrosis seen  throughout the cervical spine. Other neck: No other acute soft tissue abnormality within the neck. No mass lesion or adenopathy. Upper chest: Trace layering right pleural effusion. Visualized upper chest demonstrates no other acute finding. Review of the MIP images confirms the above findings CTA HEAD FINDINGS Anterior circulation: Petrous segments widely patent bilaterally. Scattered atheromatous plaque within the cavernous/supraclinoid ICAs without hemodynamically significant stenosis. A1 segments widely patent. Normal anterior communicating artery complex. Anterior cerebral arteries widely patent to their distal aspects. M1 segments widely patent without stenosis or occlusion. Normal MCA bifurcations. Distal MCA branches well perfused and symmetric. Posterior circulation: Mild focal non stenotic plaque noted within the proximal right V4 segment. Vertebral arteries otherwise unremarkable widely patent to the vertebrobasilar junction. Both picas patent bilaterally. Basilar widely patent to its distal aspect without stenosis. Superior cerebral arteries patent  bilaterally. Both posterior cerebral arteries primarily supplied via the basilar well perfused to their distal aspects. Venous sinuses: Patent. Anatomic variants: None significant. Review of the MIP images confirms the above findings IMPRESSION: 1. Negative CTA with no evidence for large vessel occlusion. 2. Mild atheromatous change involving the major arterial vasculature of the head and neck as above without hemodynamically significant or correctable stenosis. 3. Small layering right pleural effusion. Electronically Signed   By: Jeannine Boga M.D.   On: 01/14/2019 23:07   Dg Chest 2 View  Result Date: 01/14/2019 CLINICAL DATA:  Hypertension.  Recent CVA EXAM: CHEST - 2 VIEW COMPARISON:  December 19, 2008 FINDINGS: There is no edema or consolidation. Heart size and pulmonary vascularity are normal. No adenopathy. No evident bone lesions.  IMPRESSION: No edema or consolidation. Electronically Signed   By: Lowella Grip III M.D.   On: 01/14/2019 21:43   Ct Head Wo Contrast  Result Date: 01/13/2019 CLINICAL DATA:  Neuro deficit(s), subacute Possible stroke EXAM: CT HEAD WITHOUT CONTRAST TECHNIQUE: Contiguous axial images were obtained from the base of the skull through the vertex without intravenous contrast. COMPARISON:  None. FINDINGS: Brain: No intracranial hemorrhage, mass effect, or midline shift. Brain volume is normal for age. No hydrocephalus. The basilar cisterns are patent. Moderate periventricular chronic small vessel ischemia. No evidence of territorial infarct or acute ischemia. No extra-axial or intracranial fluid collection. Vascular: No hyperdense vessel. Skull: No fracture or focal lesion. Sinuses/Orbits: No acute finding. Bilateral cataract resection. Other: None. IMPRESSION: 1. No acute intracranial abnormality. 2. Moderate chronic small vessel ischemia. Electronically Signed   By: Keith Rake M.D.   On: 01/13/2019 23:03   Ct Angio Neck W Or Wo Contrast  Result Date: 01/14/2019 CLINICAL DATA:  Follow-up examination for acute stroke, right-sided weakness. EXAM: CT ANGIOGRAPHY HEAD AND NECK TECHNIQUE: Multidetector CT imaging of the head and neck was performed using the standard protocol during bolus administration of intravenous contrast. Multiplanar CT image reconstructions and MIPs were obtained to evaluate the vascular anatomy. Carotid stenosis measurements (when applicable) are obtained utilizing NASCET criteria, using the distal internal carotid diameter as the denominator. CONTRAST:  171m OMNIPAQUE IOHEXOL 350 MG/ML SOLN COMPARISON:  Prior CT and MRI from 01/13/2019 and 01/14/2019. FINDINGS: CTA NECK FINDINGS Aortic arch: Visualized aortic arch of normal caliber with normal branch pattern. Mild-to-moderate atherosclerotic change within the arch itself. No hemodynamically significant stenosis about the origin  of the great vessels. Visualized subclavian arteries widely patent. Right carotid system: Right common and internal carotid arteries mildly tortuous but are widely patent to the skull base without stenosis, dissection or occlusion. No significant atheromatous narrowing seen about the right carotid bifurcation. Left carotid system: Left common and internal carotid arteries are mildly tortuous but are widely patent to the skull base without stenosis, dissection, or occlusion. No significant atheromatous narrowing about the left carotid bifurcation. Vertebral arteries: Left vertebral artery arises directly from the aortic arch. Right vertebral artery rises from the right subclavian artery. Mild plaque at the origin of the right vertebral artery without significant stenosis. Vertebral arteries otherwise widely patent within the neck without stenosis, dissection, or occlusion. Skeleton: No acute osseous finding. No discrete lytic or blastic osseous lesions. Bulky anterior osteophytic spurring noted at C4-5-C6-7. Moderate to advanced multilevel facet arthrosis seen throughout the cervical spine. Other neck: No other acute soft tissue abnormality within the neck. No mass lesion or adenopathy. Upper chest: Trace layering right pleural effusion. Visualized upper chest demonstrates no other acute finding.  Review of the MIP images confirms the above findings CTA HEAD FINDINGS Anterior circulation: Petrous segments widely patent bilaterally. Scattered atheromatous plaque within the cavernous/supraclinoid ICAs without hemodynamically significant stenosis. A1 segments widely patent. Normal anterior communicating artery complex. Anterior cerebral arteries widely patent to their distal aspects. M1 segments widely patent without stenosis or occlusion. Normal MCA bifurcations. Distal MCA branches well perfused and symmetric. Posterior circulation: Mild focal non stenotic plaque noted within the proximal right V4 segment. Vertebral  arteries otherwise unremarkable widely patent to the vertebrobasilar junction. Both picas patent bilaterally. Basilar widely patent to its distal aspect without stenosis. Superior cerebral arteries patent bilaterally. Both posterior cerebral arteries primarily supplied via the basilar well perfused to their distal aspects. Venous sinuses: Patent. Anatomic variants: None significant. Review of the MIP images confirms the above findings IMPRESSION: 1. Negative CTA with no evidence for large vessel occlusion. 2. Mild atheromatous change involving the major arterial vasculature of the head and neck as above without hemodynamically significant or correctable stenosis. 3. Small layering right pleural effusion. Electronically Signed   By: Jeannine Boga M.D.   On: 01/14/2019 23:07   Mr Brain Wo Contrast  Result Date: 01/14/2019 CLINICAL DATA:  Could not hold fork and right hand. EXAM: MRI HEAD WITHOUT CONTRAST TECHNIQUE: Multiplanar, multiecho pulse sequences of the brain and surrounding structures were obtained without intravenous contrast. COMPARISON:  Head CT from yesterday FINDINGS: Brain: Wedge of restricted diffusion in the left corona radiata measuring up to 12 mm in length. Background of confluent chronic small vessel ischemic gliosis in the cerebral white matter. Remote lacunar infarcts have occurred in the bilateral medial thalamus. Brain volume is normal for age. No acute hemorrhage, hydrocephalus, or masslike finding. Vascular: Normal flow voids Skull and upper cervical spine: No focal marrow lesion. Cervical facet spurring with left-sided C2-3 ankylosis. Sinuses/Orbits: Bilateral cataract resection IMPRESSION: 1. 12 mm acute infarct in the left corona radiata. 2. Advanced chronic small vessel ischemia. Electronically Signed   By: Monte Fantasia M.D.   On: 01/14/2019 11:59        Scheduled Meds:  atorvastatin  80 mg Oral q1800   enoxaparin (LOVENOX) injection  40 mg Subcutaneous Q24H    Continuous Infusions:   LOS: 1 day    Time spent: 25 minutes    Noe Gens, AG-ACNP-S  [employer]  [facility] 01/15/2019, 7:01 AM     Please page through [place]:  [email or web address] Password [email or web address] If 7PM-7AM, please contact night-coverage  Attending MD Note:  I have seen and examined the patient with nurse practitioner/physician assistant and agree with the note above which has been edited to reflect our agreed upon history, exam, and assessment/plan.   I have personally reviewed the orders for the patient, which were made under my direction.      Ethel

## 2019-01-15 NOTE — Progress Notes (Signed)
Occupational Therapy Evaluation Patient Details Name: Derrick Lawrence MRN: [id number] DOB: [birth year] Today's Date: 01/15/2019    History of Present Illness Derrick Lawrence is a 78 y.o. male with medical history significant of HTN, HLD, ED who presents for right hand weakness and right leg weakness. MRI revealed L corona radiata infarct.    Clinical Impression   PTA, pt lived in one story home with his wife, and was independent for ADLs. Pt currently presents with decreased strength, balance, coordination, ROM, activity tolerance, and increased pain. Pt has RUE weakness and decreased coordination and dexterity. Pt requires min guard A for dressing, bathing, and functional mobility. Pt requires min A for grooming and eating/feeding due to difficulty with bilateral coordation. Pt would benefit from OT acutely to facilitate safe dc. Recommend dc to OP OT to address deficits and increase functional use of RUE.     Follow Up Recommendations  Outpatient OT;Supervision/Assistance - 24 hour    Equipment Recommendations  None recommended by OT    Recommendations for Other Services PT consult     Precautions / Restrictions Precautions Precautions: Fall Restrictions Weight Bearing Restrictions: No      Mobility Bed Mobility Overal bed mobility: Modified Independent             General bed mobility comments: sitting in recliner upon arrival  Transfers Overall transfer level: Needs assistance Equipment used: None Transfers: Sit to/from Stand Sit to Stand: Supervision         General transfer comment: supervision for safety    Balance Overall balance assessment: Needs assistance Sitting-balance support: No upper extremity supported;Feet supported Sitting balance-Leahy Scale: Good     Standing balance support: No upper extremity supported;During functional activity Standing balance-Leahy Scale: Good Standing balance comment: Pt demostrated good static standing balance,  demonstrated some deficits with functional mobility                           ADL either performed or assessed with clinical judgement   ADL Overall ADL's : Needs assistance/impaired Eating/Feeding: Sitting;Minimal assistance Eating/Feeding Details (indicate cue type and reason): pt reports difficulty using RUE to eat, dropping utensils. Pt demonstrated difficulty with fine motor dexterity to open/close lid on bottle for drinking Grooming: Minimal assistance;Sitting Grooming Details (indicate cue type and reason): pt reported difficulty with shaving using RUE Upper Body Bathing: Sitting;Min guard   Lower Body Bathing: Sit to/from stand;Min guard   Upper Body Dressing : Supervision/safety;Sitting   Lower Body Dressing: Sit to/from stand;Min guard Lower Body Dressing Details (indicate cue type and reason): Min guard for balance during functional task. Foresee difficulties completing fine motor activity such as buttoning pants Toilet Transfer: Min guard;Ambulation(simulated to recliner)   Toileting- Clothing Manipulation and Hygiene: Sit to/from stand;Min guard       Functional mobility during ADLs: Min guard General ADL Comments: Pt required min guard A for dressing/bathing, min A for fine motor activities inlcuding grooming/bathing due to RUE weakness and difficulties with bilateral coordation. Provided education on increasing neuroplasticity in RUE by practicing with it.      Vision Baseline Vision/History: Cataracts(had corrective surgery several years prior) Patient Visual Report: No change from baseline Vision Assessment?: No apparent visual deficits     Perception     Praxis      Pertinent Vitals/Pain Pain Assessment: Faces Faces Pain Scale: Hurts a little bit Pain Location: R shoulder Pain Descriptors / Indicators: Aching;Sore Pain Intervention(s): Limited activity within patient's tolerance;Monitored  during session;Repositioned     Hand Dominance Right    Extremity/Trunk Assessment Upper Extremity Assessment Upper Extremity Assessment: RUE deficits/detail RUE Deficits / Details: pt had decreased ROM and coordination of RUE. girp strength 3/5. Difficulty with finger to nose and thumb opposition. Pt demonstrated decreased fine motor control to unscrew cap from bottle.  RUE Sensation: decreased light touch;decreased proprioception RUE Coordination: decreased fine motor;decreased gross motor   Lower Extremity Assessment Lower Extremity Assessment: Defer to PT evaluation RLE Deficits / Details: strength 5/5 but with gait, pt occasionally staggered to R and showed slowed responses RLE RLE Sensation: WNL RLE Coordination: decreased gross motor   Cervical / Trunk Assessment Cervical / Trunk Assessment: Normal   Communication Communication Communication: No difficulties   Cognition Arousal/Alertness: Awake/alert Behavior During Therapy: WFL for tasks assessed/performed;Impulsive Overall Cognitive Status: Within Functional Limits for tasks assessed                                 General Comments: mildly decreased awareness of deficits vs denial   General Comments  Pt's gransdon present throughout session. Reports family can provide support    Exercises     Shoulder Instructions      Home Living Family/patient expects to be discharged to:: Private residence Living Arrangements: Spouse/significant other Available Help at Discharge: Family;Available 24 hours/day Type of Home: House Home Access: Stairs to enter [employer] of Steps: 4 Entrance Stairs-Rails: Left Home Layout: Laundry or work area in basement;One level     Bathroom Shower/Tub: Occupational psychologist: Handicapped height     Home Equipment: Environmental consultant - 2 wheels   Additional Comments: pt lives with wife, supportive family  Lives With: Significant other    Prior Functioning/Environment Level of Independence: Independent         Comments: pt drives, his wife did not. Performed simple meal prep, independent for all BADLs        OT Problem List: Decreased strength;Decreased range of motion;Decreased activity tolerance;Impaired balance (sitting and/or standing);Decreased coordination;Decreased safety awareness;Decreased knowledge of use of DME or AE;Decreased knowledge of precautions;Impaired UE functional use;Pain      OT Treatment/Interventions: Self-care/ADL training;DME and/or AE instruction;Therapeutic activities;Patient/family education;Balance training    OT Goals(Current goals can be found in the care plan section) Acute Rehab OT Goals Patient Stated Goal: go home OT Goal Formulation: With patient Time For Goal Achievement: 02/05/19 Potential to Achieve Goals: Good  OT Frequency: Min 3X/week   Barriers to D/C:            Co-evaluation              AM-PAC OT "6 Clicks" Daily Activity     Outcome Measure Help from another person eating meals?: None Help from another person taking care of personal grooming?: A Little Help from another person toileting, which includes using toliet, bedpan, or urinal?: A Little Help from another person bathing (including washing, rinsing, drying)?: A Little Help from another person to put on and taking off regular upper body clothing?: A Little Help from another person to put on and taking off regular lower body clothing?: A Little 6 Click Score: 19   End of Session Equipment Utilized During Treatment: Gait belt Nurse Communication: Mobility status  Activity Tolerance: Patient tolerated treatment well Patient left: in chair;with call bell/phone within reach;with family/visitor present  OT Visit Diagnosis: Unsteadiness on feet (R26.81);Other abnormalities of gait and mobility (R26.89);Muscle weakness (  generalized) (M62.81);Pain;Other symptoms and signs involving the nervous system (R29.898) Pain - Right/Left: Right Pain - part of body: Shoulder                 Time: [id number] OT Time Calculation (min): 22 min Charges:  OT General Charges $OT Visit: 1 Visit OT Evaluation $OT Eval Moderate Complexity: [place], OT Student  Gus Rankin 01/15/2019, 4:12 PM

## 2019-01-15 NOTE — Progress Notes (Signed)
STROKE TEAM PROGRESS NOTE   HISTORY OF PRESENT ILLNESS (per record) Derrick Lawrence is an 78 y.o. male with a PMHx significant for HTN and HLD who presented to the [facility] ED with c/o right sided weakness. Per patient: symptoms started about 12 pm 01/13/2019.  He was making some potato salad. When he went to taste it, he noticed that he could not quite grasp his fork and get his right hand to do what he wanted it to do. His wife witnessed the patient not being able to hold a fork in his right hand and he was falling to the right side. The family felt like his speech was a little off. Patient reported this to family about 7 pm last night and they then brought him to the ED to be evaluated for stroke. Denies any previous stroke history, CP,HA, vision changes, ETOH use, cigarette smoking or drug abuse. Patient denies taking a daily ASA or any blood thinners.    ED course:  CTH: no hemorrhage MRI: 12 mm acute infarct left corona radiata BP: Highest reading 213/104  BG: 123 Date last known well: 01/13/2019 Time last known well: 1200 tPA Given: No: outside of window  Modified Rankin: Rankin Score=1 NIHSS:3   INTERVAL HISTORY Complains of right shoulder pain which predated the stroke.      OBJECTIVE Vitals:   01/14/19 2345 01/15/19 0341 01/15/19 0846 01/15/19 1156  BP: (!) 169/85 (!) 186/91 (!) 195/102 (!) 183/100  Pulse: 72 72 68 83  Resp: 18 18 16 18   Temp: 98 F (36.7 C) 97.6 F (36.4 C) 98.4 F (36.9 C) 97.9 F (36.6 C)  TempSrc: Oral Oral Oral Oral  SpO2: 98% 98% 99% 94%  Weight:      Height:        CBC:  Recent Labs  Lab 01/13/19 2208 01/13/19 2217 01/14/19 1811  WBC 8.8  --  8.5  NEUTROABS 4.8  --   --   HGB 15.0 14.3 14.3  HCT 41.7 42.0 39.1  MCV 85.6  --  84.3  PLT 215  --  [id number]    Basic Metabolic Panel:  Recent Labs  Lab 01/13/19 2208 01/13/19 2217 01/14/19 0838 01/14/19 1811  NA 130* 130*  --   --   K 2.8* 3.0*  --   --   CL 93* 89*  --   --   CO2 26   --   --   --   GLUCOSE 126* 123*  --   --   BUN 15 16  --   --   CREATININE 1.17 1.10  --  1.07  CALCIUM 8.6*  --   --   --   MG  --   --  1.9  --     Lipid Panel:     Component Value Date/Time   CHOL 144 01/15/2019 0256   TRIG 123 01/15/2019 0256   HDL 33 (L) 01/15/2019 0256   CHOLHDL 4.4 01/15/2019 0256   VLDL 25 01/15/2019 0256   LDLCALC 86 01/15/2019 0256   HgbA1c:  Lab Results  Component Value Date   HGBA1C 5.7 (H) 01/15/2019   Urine Drug Screen: No results found for: LABOPIA, COCAINSCRNUR, LABBENZ, AMPHETMU, THCU, LABBARB  Alcohol Level No results found for: ETH  IMAGING  Ct Angio Head W Or Wo Contrast Ct Angio Neck W Or Wo Contrast 01/14/2019 IMPRESSION:  1. Negative CTA with no evidence for large vessel occlusion.  2. Mild atheromatous change involving the major  arterial vasculature of the head and neck as above without hemodynamically significant or correctable stenosis.  3. Small layering right pleural effusion.   Dg Chest 2 View 01/14/2019 IMPRESSION:  No edema or consolidation.   Ct Head Wo Contrast 01/13/2019 IMPRESSION:  1. No acute intracranial abnormality.  2. Moderate chronic small vessel ischemia.   Mr Brain Wo Contrast 01/14/2019 IMPRESSION:  1. 12 mm acute infarct in the left corona radiata.  2. Advanced chronic small vessel ischemia.     Transthoracic Echocardiogram  01/15/2019 Pending   ECG - SR rate 70 BPM. (See cardiology reading for complete details)   PHYSICAL EXAM Blood pressure (!) 183/100, pulse 83, temperature 97.9 F (36.6 C), temperature source Oral, resp. rate 18, height 5\' 8"  (1.727 m), weight 86.2 kg, SpO2 94 %.   Awake, alert, fully oriented.  Face symmetrical.  EOMI.  PERL. Tongue midline. RUE pronator drift and some finger abduction 3/5 weakness. LUE and BLE 5/5. Sensory - intact bilaterally. Coord - FTN intact bil Gait - normal per PT     HOME MEDICATIONS:  Medications Prior to Admission   Medication Sig Dispense Refill  . atorvastatin (LIPITOR) 10 MG tablet Take 10 mg by mouth at bedtime.     Marland Kitchen doxazosin (CARDURA) 8 MG tablet Take 1 tablet (8 mg total) by mouth at bedtime. (Patient taking differently: Take 8 mg by mouth every morning. ) 90 tablet 3  . fluticasone (FLONASE) 50 MCG/ACT nasal spray Place 2 sprays into the nose daily. (Patient taking differently: Place 2 sprays into both nostrils daily. ) 1 g 11  . hydrochlorothiazide (HYDRODIURIL) 25 MG tablet Take 1 tablet (25 mg total) by mouth daily. (Patient taking differently: Take 25 mg by mouth every morning. ) 90 tablet 3  . metoprolol succinate (TOPROL-XL) 100 MG 24 hr tablet Take 100 mg by mouth daily.    . mupirocin ointment (BACTROBAN) 2 % Apply 1 application topically 2 (two) times daily. Apply to wound on nose    . quinapril (ACCUPRIL) 40 MG tablet Take 1 tablet (40 mg total) by mouth at bedtime. (Patient taking differently: Take 40 mg by mouth every morning. ) 90 tablet 3  . valACYclovir (VALTREX) 1000 MG tablet Take 1,000 mg by mouth See admin instructions. Take two tablets (2000 mg) by mouth at onset of cold sore, may take two tablets (2000 mg) 12 hours later if still needed.    . metoprolol (LOPRESSOR) 100 MG tablet One po qd (Patient not taking: Reported on 01/14/2019) 90 tablet 3  . pravastatin (PRAVACHOL) 40 MG tablet Take 1 tablet (40 mg total) by mouth daily. (Patient not taking: Reported on 01/14/2019) 90 tablet 3  . sildenafil (VIAGRA) 100 MG tablet Take 0.5-1 tablets (50-100 mg total) by mouth daily as needed for erectile dysfunction. (Patient not taking: Reported on 01/14/2019) 5 tablet 11      HOSPITAL MEDICATIONS:  . aspirin  325 mg Oral Daily  . atorvastatin  80 mg Oral q1800  . enoxaparin (LOVENOX) injection  40 mg Subcutaneous Q24H  . metoprolol succinate  100 mg Oral Daily  . potassium chloride  30 mEq Oral BID      ASSESSMENT/PLAN Mr. VENNIE GIAMANCO is a 78 y.o. male with history of Hld,  and Htn presenting with RUE weakness, speech difficulties and elevated BP. He did not receive IV t-PA due to late presentation (>4.5 hours from time of onset)  Stroke: 12 mm acute infarct in the left corona radiata - small  vessel disease  Resultant  Right arm weakness  Code Stroke CT Head - not ordered  CT head - No acute intracranial abnormality.   2. Moderate chronic small vessel ischemia.   MRI head - 12 mm acute infarct in the left corona radiata. Advanced chronic small vessel ischemia.   MRA head - not ordered  CTA H&N - Negative CTA with no evidence for large vessel occlusion.   CT Perfusion - not ordered  Carotid Doppler - CTA neck performed - carotid dopplers not indicated.  2D Echo - pending  Lacey Jensen Virus 2 - negative  LDL - 86  HgbA1c - 5.7  UDS - not ordered  VTE prophylaxis - Lovenox Diet  Diet Order            Diet Heart Room service appropriate? Yes; Fluid consistency: Thin  Diet effective now              No antithrombotic prior to admission, now on aspirin 325 mg daily  Patient counseled to be compliant with his antithrombotic medications  Ongoing aggressive stroke risk factor management  Therapy recommendations:  pending  Disposition:  Pending  Hypertension  Home BP meds: Cardura, HCTZ, Metoprolol, Accupril and Lopressor  Current BP meds: Metoprolol   Systolic blood pressure somewhat high at times but within parameters . Permissive hypertension (OK if < 220/120) but gradually normalize in 5-7 days . Long-term BP goal normotensive  Hyperlipidemia  Home Lipid lowering medication: Lipitor 10 mg daily and Pravachol 40 mg daily  LDL 86, goal < 70  Current lipid lowering medication: Lipitor 80 mg daily   Continue statin at discharge  Other Stroke Risk Factors  Advanced age  Obesity, Body mass index is 28.89 kg/m., recommend weight loss, diet and exercise as appropriate   Family hx stroke (son)  Other Active  Problems  Hypokalemia - 2.8->3.0 supplement  Hyponatremia - 130   Hospital day # 1  Acute left corona radiata ischemic infarct with residual right hand/arm weakness.  The right shoulder pain which may rotator cuff injury from the past is also limiting his rehab.  Hypertension is his main risk factor.  The beta blocker will be re-started today.  He was not on antiplatelet therapy at home,so will continue ASA 81 mg qd at home.  TG and A1c are normal.  Although LDL 86, goal is <70 so will go home on higher Lipitor dose.  Follow up in neuro stroke clinic in the next 4-6 weeks.  Will sign off.    Rogue Jury, MS, MD   To contact Stroke Continuity provider, please refer to [email or web address] After hours, contact General Neurology

## 2019-01-15 NOTE — Evaluation (Signed)
Physical Therapy Evaluation Patient Details Name: Derrick Lawrence MRN: [id number] DOB: 04/07/1940 Today's Date: 01/15/2019   History of Present Illness  Derrick Lawrence is a 78 y.o. male with medical history significant of HTN, HLD, ED who presents for right hand weakness and right leg weakness. MRI revealed L corona radiata infarct.   Clinical Impression  Pt admitted with above diagnosis. Pt's most notable deficit is decreased dexterity R hand and he is not attending as much to the limitations he has with balance and safety awareness. Ambulated 250' with occasional stagger to R and min A to correct. Discussed balance limitations and increased reaction time that is decreasing his safety and recommended outpt PT for balance. Pt and grandson are agreeable and grandson states that family will be able to provide transportation.  Pt currently with functional limitations due to the deficits listed below (see PT Problem List). Pt will benefit from skilled PT to increase their independence and safety with mobility to allow discharge to the venue listed below.       Follow Up Recommendations Outpatient PT;Supervision for mobility/OOB    Equipment Recommendations  None recommended by PT    Recommendations for Other Services       Precautions / Restrictions Precautions Precautions: Fall Restrictions Weight Bearing Restrictions: No      Mobility  Bed Mobility Overal bed mobility: Modified Independent                Transfers Overall transfer level: Modified independent               General transfer comment: no LOB with sit<>stand  Ambulation/Gait Ambulation/Gait assistance: Min assist Gait Distance (Feet): 250 Feet Assistive device: None Gait Pattern/deviations: Staggering right;Step-through pattern Gait velocity: WFL Gait velocity interpretation: >2.62 ft/sec, indicative of community ambulatory General Gait Details: pt with occasional R stagger with min A to  correct  Stairs Stairs: Yes Stairs assistance: Min assist Stair Management: One rail Right;Step to pattern;Forwards Number of Stairs: 4 General stair comments: pt with R knee OA leading to step-to pattern. Needed min A on stairs to turn around to descend.   Wheelchair Mobility    Modified Rankin (Stroke Patients Only) Modified Rankin (Stroke Patients Only) Pre-Morbid Rankin Score: No symptoms Modified Rankin: Moderate disability     Balance Overall balance assessment: Needs assistance Sitting-balance support: No upper extremity supported;Feet supported Sitting balance-Leahy Scale: Good     Standing balance support: No upper extremity supported Standing balance-Leahy Scale: Good Standing balance comment: pt with increased response time to perturbation                             Pertinent Vitals/Pain Pain Assessment: Faces Faces Pain Scale: Hurts even more Pain Location: R shoulder Pain Descriptors / Indicators: Aching;Sore Pain Intervention(s): Monitored during session    Home Living Family/patient expects to be discharged to:: Private residence Living Arrangements: Spouse/significant other Available Help at Discharge: Family;Available 24 hours/day Type of Home: House Home Access: Stairs to enter Entrance Stairs-Rails: Right Entrance Stairs-Number of Steps: 4 Home Layout: Laundry or work area in basement;One level Home Equipment: Walker - 2 wheels Additional Comments: pt lives with wife, supportive family    Prior Function Level of Independence: Independent         Comments: pt drives, his wife did not     Hand Dominance   Dominant Hand: Right    Extremity/Trunk Assessment   Upper Extremity Assessment Upper Extremity  Assessment: Defer to OT evaluation;RUE deficits/detail RUE Deficits / Details: decreased dexterity noted. Pt has h/o R shoulder pain with cortisone injection for treatment. Reports that it is currently flared  RUE Sensation:  decreased light touch;decreased proprioception RUE Coordination: decreased fine motor;decreased gross motor    Lower Extremity Assessment Lower Extremity Assessment: RLE deficits/detail RLE Deficits / Details: strength 5/5 but with gait, pt occasionally staggered to R and showed slowed responses RLE RLE Sensation: WNL RLE Coordination: decreased gross motor    Cervical / Trunk Assessment Cervical / Trunk Assessment: Normal  Communication   Communication: No difficulties  Cognition Arousal/Alertness: Awake/alert Behavior During Therapy: WFL for tasks assessed/performed;Impulsive Overall Cognitive Status: Within Functional Limits for tasks assessed                                 General Comments: mildly decreased awareness of deficits vs denial      General Comments General comments (skin integrity, edema, etc.): pt's grandson present for session and reports that family can provide transportation as well as increased supervision of pt if needed.     Exercises     Assessment/Plan    PT Assessment Patient needs continued PT services  PT Problem List Decreased balance;Decreased mobility;Decreased safety awareness;Decreased knowledge of precautions;Pain;Impaired sensation       PT Treatment Interventions DME instruction;Gait training;Stair training;Functional mobility training;Therapeutic activities;Therapeutic exercise;Balance training;Neuromuscular re-education;Cognitive remediation;Patient/family education    PT Goals (Current goals can be found in the Care Plan section)  Acute Rehab PT Goals Patient Stated Goal: go home PT Goal Formulation: With patient Time For Goal Achievement: 01/29/19 Potential to Achieve Goals: Good Additional Goals Additional Goal #1: Pt to score >45 on Berg balance    Frequency Min 4X/week   Barriers to discharge        Co-evaluation               AM-PAC PT "6 Clicks" Mobility  Outcome Measure Help needed turning from  your back to your side while in a flat bed without using bedrails?: None Help needed moving from lying on your back to sitting on the side of a flat bed without using bedrails?: None Help needed moving to and from a bed to a chair (including a wheelchair)?: A Little Help needed standing up from a chair using your arms (e.g., wheelchair or bedside chair)?: A Little Help needed to walk in hospital room?: A Little Help needed climbing 3-5 steps with a railing? : A Little 6 Click Score: 20    End of Session Equipment Utilized During Treatment: Gait belt Activity Tolerance: Patient tolerated treatment well Patient left: in chair;with call bell/phone within reach;with family/visitor present Nurse Communication: Mobility status PT Visit Diagnosis: Unsteadiness on feet (R26.81);Other abnormalities of gait and mobility (R26.89)    Time: [id number] PT Time Calculation (min) (ACUTE ONLY): 32 min   Charges:   PT Evaluation $PT Eval Moderate Complexity: 1 Mod PT Treatments $Gait Training: 8-22 mins        Leighton Roach, [place]  Pager [phone] [place] 01/15/2019, 2:04 PM

## 2019-01-16 ENCOUNTER — Inpatient Hospital Stay (HOSPITAL_COMMUNITY): Payer: PPO

## 2019-01-16 LAB — BASIC METABOLIC PANEL
Anion gap: 9 (ref 5–15)
BUN: 7 mg/dL — ABNORMAL LOW (ref 8–23)
CO2: 27 mmol/L (ref 22–32)
Calcium: 8.6 mg/dL — ABNORMAL LOW (ref 8.9–10.3)
Chloride: 101 mmol/L (ref 98–111)
Creatinine, Ser: 0.96 mg/dL (ref 0.61–1.24)
GFR calc Af Amer: >60 mL/min (ref >60)
GFR calc non Af Amer: >60 mL/min (ref >60)
Glucose, Bld: 110 mg/dL — ABNORMAL HIGH (ref 70–99)
Potassium: 3.7 mmol/L (ref 3.5–5.1)
Sodium: 137 mmol/L (ref 135–145)

## 2019-01-16 LAB — CBC
HCT: 41.2 % (ref 39.0–52.0)
Hemoglobin: 14.9 g/dL (ref 13.0–17.0)
MCH: 31.4 pg (ref 26.0–34.0)
MCHC: 36.2 g/dL — ABNORMAL HIGH (ref 30.0–36.0)
MCV: 86.9 fL (ref 80.0–100.0)
Platelets: 219 10*3/uL (ref 150–400)
RBC: 4.74 MIL/uL (ref 4.22–5.81)
RDW: 12.7 % (ref 11.5–15.5)
WBC: 7.3 10*3/uL (ref 4.0–10.5)
nRBC: 0 % (ref 0.0–0.2)

## 2019-01-16 MED ORDER — ATORVASTATIN CALCIUM 40 MG PO TABS: 40.0000 mg | ORAL_TABLET | Freq: Every day | ORAL | 11 refills | Status: DC

## 2019-01-16 MED ORDER — ASPIRIN 325 MG PO TABS: 325.0000 mg | ORAL_TABLET | Freq: Every day | ORAL | Status: DC

## 2019-01-16 NOTE — Plan of Care (Signed)

## 2019-01-16 NOTE — Discharge Summary (Signed)
Physician Discharge Summary  Jencarlo Alessandrini [facility] [id number] DOB: 1940/04/29 DOA: 01/13/2019  PCP: Christain Sacramento, MD  Admit date: 01/13/2019 Discharge date: 01/16/2019  Admitted From: Home  Disposition:  Home   Recommendations for Outpatient Follow-up:  1. Follow up with Dr. Redmond Pulling in 1 week 2. Dr. Redmond Pulling: please review BP log and adjust antihypertensives as needed 3. Follow up with [facility] in 4-6 weeks 4. Dr. Redmond Pulling: Please consider right shoulder injection, if indicated for chronic shoulder pain, with the aim of facilitating patient's OT therapy for his stroke 5. Dr. Redmond Pulling: Please repeat LDL in 3 months on new statin dose     Home Health: Outpatient OT  Equipment/Devices: None  Discharge Condition: Good  CODE STATUS: FULL Diet recommendation: Cardiac  Brief/Interim Summary: Mr. Lubin is a 78 y.o. M with hx smoking, HTN and hyperlipidemia who presented with right hand and leg weakness. Patient was making potato salad when he developed right hand clumsiness and leg symptoms.  This persisted and so he came to the ER.  In the ER, CT head unremarkable.  Neurology were consulted who did not recommend tPA being outside the window.   Subsequent MRI demonstrated a 76mm acute infarct in the left corona radiata.       PRINCIPAL HOSPITAL DIAGNOSIS: Acute stroke    Discharge Diagnoses:   Acute CVA Initial symptoms of right hand, leg weakness. 76mm stroke affecting the corona radiata.    MRI with 52mm acute infarct in the left corona radiata.   -Started on daily aspirin 325 mg -Lipids > showed LDL >70, so Lipitor dose increased. Hemoglobin A1c 5.7, no indication for glycemic control  -Carotid imaging > CTA head / neck without significant occlusion  -Echocardiogram showed normal EF, normal valves, no source of embolism -PT consulted -Permissive hypertension in hospital, discharged with plans to resume home regimen and slowly normalize BP to strict goal 130/80  within 5-7 days -VTE prophylaxis ordered at time of admission -Atrial fibrillation: None noted on 48 hours telemetry -tPA not given because of out of window  -Dysphagia screen ordered in ER, passed for regular diet  -Smoking cessation - not applicable   Hyponatremia  Chronic asymptomatic  Hypertension  Permissive HTN here.  Restarted home regimen at discharge to gradually normalize in 5-7 days. -Goal BP 130/80  Chronic Right Should Pain  Prior injections per Dr. Redmond Pulling at [facility]  -follow up as outpatient with PCP or Ortho           Discharge Instructions  Discharge Instructions    Ambulatory referral to Neurology   Complete by: As directed    An appointment is requested in approximately: 4 weeks   Ambulatory referral to Occupational Therapy   Complete by: As directed    For stroke, right hand weakness   Diet - low sodium heart healthy   Complete by: As directed    Discharge instructions   Complete by: As directed    From Dr. Loleta Books -   You were admitted with a small stroke.  We were unable to identify a source of the stroke.  You do have risk factors for stroke that include high blood pressure and high cholesterol.   We have added aspirin and increased your cholesterol medicine dose (Lipitor)  Pick up aspirin 325 mg (the big tablets) at the pharmacy on your way home Also pick up your new prescription for the higher dose of cholesterol medicine (Atorvastatin 40 mg nightly)  Call Dr. Redmond Pulling for an  appointment asap  Resume all your home Blood pressure medicines tomorrow morning Get a new blood pressure cuff, if your blood pressure cuff doesn't cehck out with the ones at D.r Wilson's office  Follow up with the Neurologists at [facility] in 4-6 weeks (they will call you for an appointment, fi they haven't called you, call them at the number listed below in the To Do section) Ask them if you should continue aspirin 325 mg or reduce it  to the 81 mg dose  If you have symptoms such as slurred speech, weakness, difficulty with balance / walking, blurred vision, confusion or headache, please return to the ER immediately.   Increase activity slowly   Complete by: As directed      Allergies as of 01/16/2019   No Known Allergies     Medication List    STOP taking these medications   metoprolol tartrate 100 MG tablet Commonly known as: LOPRESSOR   pravastatin 40 MG tablet Commonly known as: PRAVACHOL   sildenafil 100 MG tablet Commonly known as: Viagra     TAKE these medications   aspirin 325 MG tablet Take 1 tablet (325 mg total) by mouth daily.   atorvastatin 40 MG tablet Commonly known as: Lipitor Take 1 tablet (40 mg total) by mouth daily. What changed:   medication strength  how much to take  when to take this   doxazosin 8 MG tablet Commonly known as: CARDURA Take 1 tablet (8 mg total) by mouth at bedtime. What changed: when to take this   fluticasone 50 MCG/ACT nasal spray Commonly known as: FLONASE Place 2 sprays into the nose daily. What changed: how to take this   hydrochlorothiazide 25 MG tablet Commonly known as: HYDRODIURIL Take 1 tablet (25 mg total) by mouth daily. What changed: when to take this   metoprolol succinate 100 MG 24 hr tablet Commonly known as: TOPROL-XL Take 100 mg by mouth daily.   mupirocin ointment 2 % Commonly known as: BACTROBAN Apply 1 application topically 2 (two) times daily. Apply to wound on nose   quinapril 40 MG tablet Commonly known as: ACCUPRIL Take 1 tablet (40 mg total) by mouth at bedtime. What changed: when to take this   Valtrex 1000 MG tablet Generic drug: valACYclovir Take 1,000 mg by mouth See admin instructions. Take two tablets (2000 mg) by mouth at onset of cold sore, may take two tablets (2000 mg) 12 hours later if still needed.      Follow-up Information    Christain Sacramento, MD. Schedule an appointment as soon as possible for a  visit in 1 week(s).   Specialty: Family Medicine Contact information: [place] [address] [phone]        [place]. Go in 4 week(s).   Contact information: [address]     [place] [phone]         No Known Allergies  Consultations:  Neurology    Procedures/Studies: Ct Angio Head W Or Wo Contrast  Result Date: 01/14/2019 CLINICAL DATA:  Follow-up examination for acute stroke, right-sided weakness. EXAM: CT ANGIOGRAPHY HEAD AND NECK TECHNIQUE: Multidetector CT imaging of the head and neck was performed using the standard protocol during bolus administration of intravenous contrast. Multiplanar CT image reconstructions and MIPs were obtained to evaluate the vascular anatomy. Carotid stenosis measurements (when applicable) are obtained utilizing NASCET criteria, using the distal internal carotid diameter as the denominator. CONTRAST:  128mL  OMNIPAQUE IOHEXOL 350 MG/ML SOLN COMPARISON:  Prior CT and MRI from 01/13/2019 and 01/14/2019. FINDINGS: CTA NECK FINDINGS Aortic arch: Visualized aortic arch of normal caliber with normal branch pattern. Mild-to-moderate atherosclerotic change within the arch itself. No hemodynamically significant stenosis about the origin of the great vessels. Visualized subclavian arteries widely patent. Right carotid system: Right common and internal carotid arteries mildly tortuous but are widely patent to the skull base without stenosis, dissection or occlusion. No significant atheromatous narrowing seen about the right carotid bifurcation. Left carotid system: Left common and internal carotid arteries are mildly tortuous but are widely patent to the skull base without stenosis, dissection, or occlusion. No significant atheromatous narrowing about the left carotid bifurcation. Vertebral arteries: Left vertebral artery arises directly from the aortic arch. Right  vertebral artery rises from the right subclavian artery. Mild plaque at the origin of the right vertebral artery without significant stenosis. Vertebral arteries otherwise widely patent within the neck without stenosis, dissection, or occlusion. Skeleton: No acute osseous finding. No discrete lytic or blastic osseous lesions. Bulky anterior osteophytic spurring noted at C4-5-C6-7. Moderate to advanced multilevel facet arthrosis seen throughout the cervical spine. Other neck: No other acute soft tissue abnormality within the neck. No mass lesion or adenopathy. Upper chest: Trace layering right pleural effusion. Visualized upper chest demonstrates no other acute finding. Review of the MIP images confirms the above findings CTA HEAD FINDINGS Anterior circulation: Petrous segments widely patent bilaterally. Scattered atheromatous plaque within the cavernous/supraclinoid ICAs without hemodynamically significant stenosis. A1 segments widely patent. Normal anterior communicating artery complex. Anterior cerebral arteries widely patent to their distal aspects. M1 segments widely patent without stenosis or occlusion. Normal MCA bifurcations. Distal MCA branches well perfused and symmetric. Posterior circulation: Mild focal non stenotic plaque noted within the proximal right V4 segment. Vertebral arteries otherwise unremarkable widely patent to the vertebrobasilar junction. Both picas patent bilaterally. Basilar widely patent to its distal aspect without stenosis. Superior cerebral arteries patent bilaterally. Both posterior cerebral arteries primarily supplied via the basilar well perfused to their distal aspects. Venous sinuses: Patent. Anatomic variants: None significant. Review of the MIP images confirms the above findings IMPRESSION: 1. Negative CTA with no evidence for large vessel occlusion. 2. Mild atheromatous change involving the major arterial vasculature of the head and neck as above without hemodynamically  significant or correctable stenosis. 3. Small layering right pleural effusion. Electronically Signed   By: Jeannine Boga M.D.   On: 01/14/2019 23:07   Dg Chest 2 View  Result Date: 01/14/2019 CLINICAL DATA:  Hypertension.  Recent CVA EXAM: CHEST - 2 VIEW COMPARISON:  December 19, 2008 FINDINGS: There is no edema or consolidation. Heart size and pulmonary vascularity are normal. No adenopathy. No evident bone lesions. IMPRESSION: No edema or consolidation. Electronically Signed   By: Lowella Grip III M.D.   On: 01/14/2019 21:43   Ct Head Wo Contrast  Result Date: 01/13/2019 CLINICAL DATA:  Neuro deficit(s), subacute Possible stroke EXAM: CT HEAD WITHOUT CONTRAST TECHNIQUE: Contiguous axial images were obtained from the base of the skull through the vertex without intravenous contrast. COMPARISON:  None. FINDINGS: Brain: No intracranial hemorrhage, mass effect, or midline shift. Brain volume is normal for age. No hydrocephalus. The basilar cisterns are patent. Moderate periventricular chronic small vessel ischemia. No evidence of territorial infarct or acute ischemia. No extra-axial or intracranial fluid collection. Vascular: No hyperdense vessel. Skull: No fracture or focal lesion. Sinuses/Orbits: No acute finding. Bilateral cataract resection. Other: None. IMPRESSION: 1. No acute  intracranial abnormality. 2. Moderate chronic small vessel ischemia. Electronically Signed   By: Keith Rake M.D.   On: 01/13/2019 23:03   Ct Angio Neck W Or Wo Contrast  Result Date: 01/14/2019 CLINICAL DATA:  Follow-up examination for acute stroke, right-sided weakness. EXAM: CT ANGIOGRAPHY HEAD AND NECK TECHNIQUE: Multidetector CT imaging of the head and neck was performed using the standard protocol during bolus administration of intravenous contrast. Multiplanar CT image reconstructions and MIPs were obtained to evaluate the vascular anatomy. Carotid stenosis measurements (when applicable) are obtained  utilizing NASCET criteria, using the distal internal carotid diameter as the denominator. CONTRAST:  110mL OMNIPAQUE IOHEXOL 350 MG/ML SOLN COMPARISON:  Prior CT and MRI from 01/13/2019 and 01/14/2019. FINDINGS: CTA NECK FINDINGS Aortic arch: Visualized aortic arch of normal caliber with normal branch pattern. Mild-to-moderate atherosclerotic change within the arch itself. No hemodynamically significant stenosis about the origin of the great vessels. Visualized subclavian arteries widely patent. Right carotid system: Right common and internal carotid arteries mildly tortuous but are widely patent to the skull base without stenosis, dissection or occlusion. No significant atheromatous narrowing seen about the right carotid bifurcation. Left carotid system: Left common and internal carotid arteries are mildly tortuous but are widely patent to the skull base without stenosis, dissection, or occlusion. No significant atheromatous narrowing about the left carotid bifurcation. Vertebral arteries: Left vertebral artery arises directly from the aortic arch. Right vertebral artery rises from the right subclavian artery. Mild plaque at the origin of the right vertebral artery without significant stenosis. Vertebral arteries otherwise widely patent within the neck without stenosis, dissection, or occlusion. Skeleton: No acute osseous finding. No discrete lytic or blastic osseous lesions. Bulky anterior osteophytic spurring noted at C4-5-C6-7. Moderate to advanced multilevel facet arthrosis seen throughout the cervical spine. Other neck: No other acute soft tissue abnormality within the neck. No mass lesion or adenopathy. Upper chest: Trace layering right pleural effusion. Visualized upper chest demonstrates no other acute finding. Review of the MIP images confirms the above findings CTA HEAD FINDINGS Anterior circulation: Petrous segments widely patent bilaterally. Scattered atheromatous plaque within the  cavernous/supraclinoid ICAs without hemodynamically significant stenosis. A1 segments widely patent. Normal anterior communicating artery complex. Anterior cerebral arteries widely patent to their distal aspects. M1 segments widely patent without stenosis or occlusion. Normal MCA bifurcations. Distal MCA branches well perfused and symmetric. Posterior circulation: Mild focal non stenotic plaque noted within the proximal right V4 segment. Vertebral arteries otherwise unremarkable widely patent to the vertebrobasilar junction. Both picas patent bilaterally. Basilar widely patent to its distal aspect without stenosis. Superior cerebral arteries patent bilaterally. Both posterior cerebral arteries primarily supplied via the basilar well perfused to their distal aspects. Venous sinuses: Patent. Anatomic variants: None significant. Review of the MIP images confirms the above findings IMPRESSION: 1. Negative CTA with no evidence for large vessel occlusion. 2. Mild atheromatous change involving the major arterial vasculature of the head and neck as above without hemodynamically significant or correctable stenosis. 3. Small layering right pleural effusion. Electronically Signed   By: Jeannine Boga M.D.   On: 01/14/2019 23:07   Mr Brain Wo Contrast  Result Date: 01/14/2019 CLINICAL DATA:  Could not hold fork and right hand. EXAM: MRI HEAD WITHOUT CONTRAST TECHNIQUE: Multiplanar, multiecho pulse sequences of the brain and surrounding structures were obtained without intravenous contrast. COMPARISON:  Head CT from yesterday FINDINGS: Brain: Wedge of restricted diffusion in the left corona radiata measuring up to 12 mm in length. Background of confluent chronic small  vessel ischemic gliosis in the cerebral white matter. Remote lacunar infarcts have occurred in the bilateral medial thalamus. Brain volume is normal for age. No acute hemorrhage, hydrocephalus, or masslike finding. Vascular: Normal flow voids Skull and  upper cervical spine: No focal marrow lesion. Cervical facet spurring with left-sided C2-3 ankylosis. Sinuses/Orbits: Bilateral cataract resection IMPRESSION: 1. 12 mm acute infarct in the left corona radiata. 2. Advanced chronic small vessel ischemia. Electronically Signed   By: Monte Fantasia M.D.   On: 01/14/2019 11:59   Dg Chest Port 1 View  Result Date: 01/16/2019 CLINICAL DATA:  Hypertension EXAM: PORTABLE CHEST 1 VIEW COMPARISON:  January 14, 2019 FINDINGS: No edema or consolidation. No pleural effusion evident. Heart size and pulmonary vascularity are normal. No adenopathy. There is aortic atherosclerosis. There are foci of degenerative change in the lower thoracic region. IMPRESSION: No edema or consolidation. Stable cardiac silhouette. No adenopathy. Aortic Atherosclerosis (ICD10-I70.0). Electronically Signed   By: Lowella Grip III M.D.   On: 01/16/2019 14:12       Subjective: Feeling well.  Eager to go home.  No change in weakness, no new fever, no confusion, no vomiting.  Discharge Exam: Vitals:   01/16/19 0345 01/16/19 0824  BP: (!) 190/95 (!) 183/104  Pulse: 62   Resp: 16   Temp: 98 F (36.7 C) 98.9 F (37.2 C)  SpO2: 98% (!) 0%   Vitals:   01/15/19 2000 01/15/19 2346 01/16/19 0345 01/16/19 0824  BP: (!) 160/100 (!) 191/93 (!) 190/95 (!) 183/104  Pulse: 83 70 62   Resp: 19 16 16    Temp: 97.9 F (36.6 C) 97.7 F (36.5 C) 98 F (36.7 C) 98.9 F (37.2 C)  TempSrc: Oral Oral Oral Oral  SpO2: 96% 97% 98% (!) 0%  Weight:      Height:        General: Pt is alert, awake, not in acute distress Cardiovascular: RRR, nl S1-S2, no murmurs appreciated.   No LE edema.   Respiratory: Normal respiratory rate and rhythm.  CTAB without rales or wheezes. Abdominal: Abdomen soft and non-tender.  No distension or HSM.   Neuro/Psych: Strength with weakness in right hand.  Judgment and insight appear normal.   The results of significant diagnostics from this  hospitalization (including imaging, microbiology, ancillary and laboratory) are listed below for reference.     Microbiology: Recent Results (from the past 240 hour(s))  SARS CORONAVIRUS 2 (TAT 6-24 HRS) Nasopharyngeal Nasopharyngeal Swab     Status: None   Collection Time: 01/14/19  1:55 PM   Specimen: Nasopharyngeal Swab  Result Value Ref Range Status   SARS Coronavirus 2 NEGATIVE NEGATIVE Final    Comment: (NOTE) SARS-CoV-2 target nucleic acids are NOT DETECTED. The SARS-CoV-2 RNA is generally detectable in upper and lower respiratory specimens during the acute phase of infection. Negative results do not preclude SARS-CoV-2 infection, do not rule out co-infections with other pathogens, and should not be used as the sole basis for treatment or other patient management decisions. Negative results must be combined with clinical observations, patient history, and epidemiological information. The expected result is Negative. Fact Sheet for Patients: SugarRoll.be Fact Sheet for Healthcare Providers: https://www.woods-mathews.com/ This test is not yet approved or cleared by the [place] FDA and  has been authorized for detection and/or diagnosis of SARS-CoV-2 by FDA under an Emergency Use Authorization (EUA). This EUA will remain  in effect (meaning this test can be used) for the duration of the COVID-19 declaration under Section  56 4(b)(1) of the Act, 21 U.S.C. section 360bbb-3(b)(1), unless the authorization is terminated or revoked sooner. Performed at [facility], [place] [address] [place], [place] [address]      Labs: BNP (last 3 results) No results for input(s): BNP in the last 8760 hours. Basic Metabolic Panel: Recent Labs  Lab 01/13/19 2208 01/13/19 2217 01/14/19 0838 01/14/19 1811 01/16/19 0304  NA 130* 130*  --   --  137  K 2.8* 3.0*  --   --  3.7  CL 93* 89*  --   --  101  CO2 26  --   --   --  27  GLUCOSE  126* 123*  --   --  110*  BUN 15 16  --   --  7*  CREATININE 1.17 1.10  --  1.07 0.96  CALCIUM 8.6*  --   --   --  8.6*  MG  --   --  1.9  --   --    Liver Function Tests: Recent Labs  Lab 01/13/19 2208  AST 18  ALT 18  ALKPHOS 78  BILITOT 0.6  PROT 6.1*  ALBUMIN 3.2*   No results for input(s): LIPASE, AMYLASE in the last 168 hours. No results for input(s): AMMONIA in the last 168 hours. CBC: Recent Labs  Lab 01/13/19 2208 01/13/19 2217 01/14/19 1811 01/16/19 0304  WBC 8.8  --  8.5 7.3  NEUTROABS 4.8  --   --   --   HGB 15.0 14.3 14.3 14.9  HCT 41.7 42.0 39.1 41.2  MCV 85.6  --  84.3 86.9  PLT 215  --  197 219   Cardiac Enzymes: No results for input(s): CKTOTAL, CKMB, CKMBINDEX, TROPONINI in the last 168 hours. BNP: Invalid input(s): POCBNP CBG: Recent Labs  Lab 01/14/19 0837  GLUCAP 97   D-Dimer No results for input(s): DDIMER in the last 72 hours. Hgb A1c Recent Labs    01/15/19 0256  HGBA1C 5.7*   Lipid Profile Recent Labs    01/15/19 0256  CHOL 144  HDL 33*  LDLCALC 86  TRIG 123  CHOLHDL 4.4   Thyroid function studies No results for input(s): TSH, T4TOTAL, T3FREE, THYROIDAB in the last 72 hours.  Invalid input(s): FREET3 Anemia work up No results for input(s): VITAMINB12, FOLATE, FERRITIN, TIBC, IRON, RETICCTPCT in the last 72 hours. Urinalysis No results found for: COLORURINE, APPEARANCEUR, [place], [place], [place], [place], [place], [place], PROTEINUR, UROBILINOGEN, NITRITE, LEUKOCYTESUR Sepsis Labs Invalid input(s): PROCALCITONIN,  WBC,  LACTICIDVEN Microbiology Recent Results (from the past 240 hour(s))  SARS CORONAVIRUS 2 (TAT 6-24 HRS) Nasopharyngeal Nasopharyngeal Swab     Status: None   Collection Time: 01/14/19  1:55 PM   Specimen: Nasopharyngeal Swab  Result Value Ref Range Status   SARS Coronavirus 2 NEGATIVE NEGATIVE Final    Comment: (NOTE) SARS-CoV-2 target nucleic acids are NOT DETECTED. The SARS-CoV-2 RNA is  generally detectable in upper and lower respiratory specimens during the acute phase of infection. Negative results do not preclude SARS-CoV-2 infection, do not rule out co-infections with other pathogens, and should not be used as the sole basis for treatment or other patient management decisions. Negative results must be combined with clinical observations, patient history, and epidemiological information. The expected result is Negative. Fact Sheet for Patients: SugarRoll.be Fact Sheet for Healthcare Providers: https://www.woods-mathews.com/ This test is not yet approved or cleared by the [place] FDA and  has been authorized for detection and/or diagnosis of SARS-CoV-2  by FDA under an Emergency Use Authorization (EUA). This EUA will remain  in effect (meaning this test can be used) for the duration of the COVID-19 declaration under Section 56 4(b)(1) of the Act, 21 U.S.C. section 360bbb-3(b)(1), unless the authorization is terminated or revoked sooner. Performed at [facility], [place] [address] [place], [place] [address]      Time coordinating discharge: 25 minutes      SIGNED:   Edwin Dada, MD  [facility] 01/16/2019, 7:49 PM

## 2019-01-16 NOTE — Progress Notes (Addendum)
PROGRESS NOTE    Derrick Lawrence  [id number] DOB: [birth year] DOA: 01/13/2019 PCP: Timmothy Euler, MD (Inactive)      Brief Narrative:  [name] is a 78 y.o. M, former smoker (age [age], 1ppd), with PMH of HTN, HLD who presented 10/23 with right hand and leg weakness that began on 10/22 at 1pm.  He had difficulty finishing making his lunch and was unable to put weight on his left leg.  Work up included a CT of the head which was negative.  Subsequent MRI demonstrated a 71mm acute infarct in the left corona radiata.  Labs notable for hyponatremia (range 131-133 since 2016).     Assessment & Plan:  Acute CVA Initial symptoms of right hand, leg weakness. 8mm stroke affecting the corona radiata.   MRI with 10mm acute infarct in the left corona radiata.  Cholesterol 144, HDL 33, LDL 86, Trig 123, VLDL 25. Hgb A1c 5.7.  Stroke risk factors = HTN, HLD.  He did not receive tPA as he was out of the window.  Passed dysphagia screening in ER.  Remained in NSR during admit. -anticipate discharge am 10/25 -continue daily aspirin 325mg , lipitor 80mg   -evaluated by Neurology, appreciate input  -SLP evaluation with no further recommendations  -PT evaluation with no home health recommendations  -OT evaluation >> home health OT ordered for discharge -Carotid imaging > no significant disease burden on CTA head / neck  -ECHO without source for CVA -Follow up with Neurology in 4-6 weeks as outpatient  -Non-smoker  -follow up with Dr. Redmond Pulling as outpatient  Hyponatremia  Chronic, range 131-133 since 2016, asymptomatic. Suspect secondary to chronic diuresis  -follow up Na with Dr. Redmond Pulling as outpatient   Hypokalemia Pseudohypocalcemia  -resolved, follow up as outpatient   Hypertension  -continue home Toprol-XL 100 -resume quinapril, doxazosin, HCTZ -diet modifications reviewed   Chronic Right Should Pain  Prior steroid injections per Dr. Redmond Pulling at [facility]  -follow  up with Dr. Redmond Pulling regarding shoulder pain or Ortho.      DVT prophylaxis: enoxaparin  Code Status: Full Code  Family Communication: Patient updated on plan of care.       Consultants:   Neurology   Procedures:   CT Head 10/22 >> no acute intracranial abnormality, moderate chronic small vessel ischemia  MRI Brain 10/22 >> 30mm acute infarct in the left corona radiata, advanced chronic small vessel ischemia  CTA Head / Neck 10/22 >> negative CTA for LVO, mild atheromatous change involving the major arterial vasculature of the head and neck without hemodynamically significant or correctable stenosis, small layering right pleural effusion   ECHO 10/13 >> LVEF 60-65%, elevated LVEDP, mild impaired relaxation of LV diastolic filling, global normal RV systolic function, LA/RA normal size, trace MVR  Antimicrobials:      Subjective: Pt reports he is ready to go home.  States he didn't sleep well overnight.     Objective: Vitals:   01/15/19 1645 01/15/19 2000 01/15/19 2346 01/16/19 0345  BP: (!) 187/99 (!) 160/100 (!) 191/93 (!) 190/95  Pulse: 80 83 70 62  Resp: 20 19 16 16   Temp: 97.9 F (36.6 C) 97.9 F (36.6 C) 97.7 F (36.5 C) 98 F (36.7 C)  TempSrc: Oral Oral Oral Oral  SpO2: 99% 96% 97% 98%  Weight:      Height:       No intake or output data in the 24 hours ending 01/16/19 0707 Filed Weights   01/13/19 2201  Weight: 86.2 kg    Examination: General appearance: elderly male sitting up in chair in no acute distress HEENT: MM pink/moist, fair dentition, left upper lateral nose with healing biopsy site, no JVD Skin: warm/dry, no edema  Cardiac: .s1s2 rrr, no m/r/g Respiratory: even/non-labored on RA, lungs bilaterally clear without wheeze  Abdomen:   Obese / soft, bsx4 active  MSK: no acute deformities, limited ROM of right shoulder (chronic) Neuro: AAOx4, speech clear, MAE, slight right sided facial droop, LE 5/5 strength, LUE 5/5, RUE 3-4/5, gross motor  intact, difficultly with fine motor  Psych: calm/appropriate, jovial     Data Reviewed: I have personally reviewed following labs and imaging studies:  CBC: Recent Labs  Lab 01/13/19 2208 01/13/19 2217 01/14/19 1811 01/16/19 0304  WBC 8.8  --  8.5 7.3  NEUTROABS 4.8  --   --   --   HGB 15.0 14.3 14.3 14.9  HCT 41.7 42.0 39.1 41.2  MCV 85.6  --  84.3 86.9  PLT 215  --  197 [id number]   Basic Metabolic Panel: Recent Labs  Lab 01/13/19 2208 01/13/19 2217 01/14/19 0838 01/14/19 1811 01/16/19 0304  NA 130* 130*  --   --  137  K 2.8* 3.0*  --   --  3.7  CL 93* 89*  --   --  101  CO2 26  --   --   --  27  GLUCOSE 126* 123*  --   --  110*  BUN 15 16  --   --  7*  CREATININE 1.17 1.10  --  1.07 0.96  CALCIUM 8.6*  --   --   --  8.6*  MG  --   --  1.9  --   --    GFR: Estimated Creatinine Clearance: 69.9 mL/min (by C-G formula based on SCr of 0.96 mg/dL). Liver Function Tests: Recent Labs  Lab 01/13/19 2208  AST 18  ALT 18  ALKPHOS 78  BILITOT 0.6  PROT 6.1*  ALBUMIN 3.2*   No results for input(s): LIPASE, AMYLASE in the last 168 hours. No results for input(s): AMMONIA in the last 168 hours. Coagulation Profile: Recent Labs  Lab 01/13/19 2208  INR 1.0   Cardiac Enzymes: No results for input(s): CKTOTAL, CKMB, CKMBINDEX, TROPONINI in the last 168 hours. BNP (last 3 results) No results for input(s): PROBNP in the last 8760 hours. HbA1C: Recent Labs    01/15/19 0256  HGBA1C 5.7*   CBG: Recent Labs  Lab 01/14/19 0837  GLUCAP 97   Lipid Profile: Recent Labs    01/15/19 0256  CHOL 144  HDL 33*  LDLCALC 86  TRIG 123  CHOLHDL 4.4   Thyroid Function Tests: No results for input(s): TSH, T4TOTAL, FREET4, T3FREE, THYROIDAB in the last 72 hours. Anemia Panel: No results for input(s): VITAMINB12, FOLATE, FERRITIN, TIBC, IRON, RETICCTPCT in the last 72 hours. Urine analysis: No results found for: COLORURINE, APPEARANCEUR, LABSPEC, PHURINE, GLUCOSEU, HGBUR,  BILIRUBINUR, KETONESUR, PROTEINUR, UROBILINOGEN, NITRITE, LEUKOCYTESUR Sepsis Labs: @LABRCNTIP (procalcitonin:4,lacticacidven:4)  ) Recent Results (from the past 240 hour(s))  SARS CORONAVIRUS 2 (TAT 6-24 HRS) Nasopharyngeal Nasopharyngeal Swab     Status: None   Collection Time: 01/14/19  1:55 PM   Specimen: Nasopharyngeal Swab  Result Value Ref Range Status   SARS Coronavirus 2 NEGATIVE NEGATIVE Final    Comment: (NOTE) SARS-CoV-2 target nucleic acids are NOT DETECTED. The SARS-CoV-2 RNA is generally detectable in upper and lower respiratory specimens during the acute phase of  infection. Negative results do not preclude SARS-CoV-2 infection, do not rule out co-infections with other pathogens, and should not be used as the sole basis for treatment or other patient management decisions. Negative results must be combined with clinical observations, patient history, and epidemiological information. The expected result is Negative. Fact Sheet for Patients: SugarRoll.be Fact Sheet for Healthcare Providers: https://www.woods-mathews.com/ This test is not yet approved or cleared by the [place] FDA and  has been authorized for detection and/or diagnosis of SARS-CoV-2 by FDA under an Emergency Use Authorization (EUA). This EUA will remain  in effect (meaning this test can be used) for the duration of the COVID-19 declaration under Section 56 4(b)(1) of the Act, 21 U.S.C. section 360bbb-3(b)(1), unless the authorization is terminated or revoked sooner. Performed at [facility], [place] [address] [place], [place] [address]          Radiology Studies: Ct Angio Head W Or Wo Contrast  Result Date: 01/14/2019 CLINICAL DATA:  Follow-up examination for acute stroke, right-sided weakness. EXAM: CT ANGIOGRAPHY HEAD AND NECK TECHNIQUE: Multidetector CT imaging of the head and neck was performed using the standard protocol during bolus  administration of intravenous contrast. Multiplanar CT image reconstructions and MIPs were obtained to evaluate the vascular anatomy. Carotid stenosis measurements (when applicable) are obtained utilizing NASCET criteria, using the distal internal carotid diameter as the denominator. CONTRAST:  170mL OMNIPAQUE IOHEXOL 350 MG/ML SOLN COMPARISON:  Prior CT and MRI from 01/13/2019 and 01/14/2019. FINDINGS: CTA NECK FINDINGS Aortic arch: Visualized aortic arch of normal caliber with normal branch pattern. Mild-to-moderate atherosclerotic change within the arch itself. No hemodynamically significant stenosis about the origin of the great vessels. Visualized subclavian arteries widely patent. Right carotid system: Right common and internal carotid arteries mildly tortuous but are widely patent to the skull base without stenosis, dissection or occlusion. No significant atheromatous narrowing seen about the right carotid bifurcation. Left carotid system: Left common and internal carotid arteries are mildly tortuous but are widely patent to the skull base without stenosis, dissection, or occlusion. No significant atheromatous narrowing about the left carotid bifurcation. Vertebral arteries: Left vertebral artery arises directly from the aortic arch. Right vertebral artery rises from the right subclavian artery. Mild plaque at the origin of the right vertebral artery without significant stenosis. Vertebral arteries otherwise widely patent within the neck without stenosis, dissection, or occlusion. Skeleton: No acute osseous finding. No discrete lytic or blastic osseous lesions. Bulky anterior osteophytic spurring noted at C4-5-C6-7. Moderate to advanced multilevel facet arthrosis seen throughout the cervical spine. Other neck: No other acute soft tissue abnormality within the neck. No mass lesion or adenopathy. Upper chest: Trace layering right pleural effusion. Visualized upper chest demonstrates no other acute finding.  Review of the MIP images confirms the above findings CTA HEAD FINDINGS Anterior circulation: Petrous segments widely patent bilaterally. Scattered atheromatous plaque within the cavernous/supraclinoid ICAs without hemodynamically significant stenosis. A1 segments widely patent. Normal anterior communicating artery complex. Anterior cerebral arteries widely patent to their distal aspects. M1 segments widely patent without stenosis or occlusion. Normal MCA bifurcations. Distal MCA branches well perfused and symmetric. Posterior circulation: Mild focal non stenotic plaque noted within the proximal right V4 segment. Vertebral arteries otherwise unremarkable widely patent to the vertebrobasilar junction. Both picas patent bilaterally. Basilar widely patent to its distal aspect without stenosis. Superior cerebral arteries patent bilaterally. Both posterior cerebral arteries primarily supplied via the basilar well perfused to their distal aspects. Venous sinuses: Patent. Anatomic variants: None significant.  Review of the MIP images confirms the above findings IMPRESSION: 1. Negative CTA with no evidence for large vessel occlusion. 2. Mild atheromatous change involving the major arterial vasculature of the head and neck as above without hemodynamically significant or correctable stenosis. 3. Small layering right pleural effusion. Electronically Signed   By: Jeannine Boga M.D.   On: 01/14/2019 23:07   Dg Chest 2 View  Result Date: 01/14/2019 CLINICAL DATA:  Hypertension.  Recent CVA EXAM: CHEST - 2 VIEW COMPARISON:  December 19, 2008 FINDINGS: There is no edema or consolidation. Heart size and pulmonary vascularity are normal. No adenopathy. No evident bone lesions. IMPRESSION: No edema or consolidation. Electronically Signed   By: Lowella Grip III M.D.   On: 01/14/2019 21:43   Ct Angio Neck W Or Wo Contrast  Result Date: 01/14/2019 CLINICAL DATA:  Follow-up examination for acute stroke, right-sided  weakness. EXAM: CT ANGIOGRAPHY HEAD AND NECK TECHNIQUE: Multidetector CT imaging of the head and neck was performed using the standard protocol during bolus administration of intravenous contrast. Multiplanar CT image reconstructions and MIPs were obtained to evaluate the vascular anatomy. Carotid stenosis measurements (when applicable) are obtained utilizing NASCET criteria, using the distal internal carotid diameter as the denominator. CONTRAST:  173mL OMNIPAQUE IOHEXOL 350 MG/ML SOLN COMPARISON:  Prior CT and MRI from 01/13/2019 and 01/14/2019. FINDINGS: CTA NECK FINDINGS Aortic arch: Visualized aortic arch of normal caliber with normal branch pattern. Mild-to-moderate atherosclerotic change within the arch itself. No hemodynamically significant stenosis about the origin of the great vessels. Visualized subclavian arteries widely patent. Right carotid system: Right common and internal carotid arteries mildly tortuous but are widely patent to the skull base without stenosis, dissection or occlusion. No significant atheromatous narrowing seen about the right carotid bifurcation. Left carotid system: Left common and internal carotid arteries are mildly tortuous but are widely patent to the skull base without stenosis, dissection, or occlusion. No significant atheromatous narrowing about the left carotid bifurcation. Vertebral arteries: Left vertebral artery arises directly from the aortic arch. Right vertebral artery rises from the right subclavian artery. Mild plaque at the origin of the right vertebral artery without significant stenosis. Vertebral arteries otherwise widely patent within the neck without stenosis, dissection, or occlusion. Skeleton: No acute osseous finding. No discrete lytic or blastic osseous lesions. Bulky anterior osteophytic spurring noted at C4-5-C6-7. Moderate to advanced multilevel facet arthrosis seen throughout the cervical spine. Other neck: No other acute soft tissue abnormality within  the neck. No mass lesion or adenopathy. Upper chest: Trace layering right pleural effusion. Visualized upper chest demonstrates no other acute finding. Review of the MIP images confirms the above findings CTA HEAD FINDINGS Anterior circulation: Petrous segments widely patent bilaterally. Scattered atheromatous plaque within the cavernous/supraclinoid ICAs without hemodynamically significant stenosis. A1 segments widely patent. Normal anterior communicating artery complex. Anterior cerebral arteries widely patent to their distal aspects. M1 segments widely patent without stenosis or occlusion. Normal MCA bifurcations. Distal MCA branches well perfused and symmetric. Posterior circulation: Mild focal non stenotic plaque noted within the proximal right V4 segment. Vertebral arteries otherwise unremarkable widely patent to the vertebrobasilar junction. Both picas patent bilaterally. Basilar widely patent to its distal aspect without stenosis. Superior cerebral arteries patent bilaterally. Both posterior cerebral arteries primarily supplied via the basilar well perfused to their distal aspects. Venous sinuses: Patent. Anatomic variants: None significant. Review of the MIP images confirms the above findings IMPRESSION: 1. Negative CTA with no evidence for large vessel occlusion. 2. Mild atheromatous change involving  the major arterial vasculature of the head and neck as above without hemodynamically significant or correctable stenosis. 3. Small layering right pleural effusion. Electronically Signed   By: Jeannine Boga M.D.   On: 01/14/2019 23:07   Mr Brain Wo Contrast  Result Date: 01/14/2019 CLINICAL DATA:  Could not hold fork and right hand. EXAM: MRI HEAD WITHOUT CONTRAST TECHNIQUE: Multiplanar, multiecho pulse sequences of the brain and surrounding structures were obtained without intravenous contrast. COMPARISON:  Head CT from yesterday FINDINGS: Brain: Wedge of restricted diffusion in the left corona  radiata measuring up to 12 mm in length. Background of confluent chronic small vessel ischemic gliosis in the cerebral white matter. Remote lacunar infarcts have occurred in the bilateral medial thalamus. Brain volume is normal for age. No acute hemorrhage, hydrocephalus, or masslike finding. Vascular: Normal flow voids Skull and upper cervical spine: No focal marrow lesion. Cervical facet spurring with left-sided C2-3 ankylosis. Sinuses/Orbits: Bilateral cataract resection IMPRESSION: 1. 12 mm acute infarct in the left corona radiata. 2. Advanced chronic small vessel ischemia. Electronically Signed   By: Monte Fantasia M.D.   On: 01/14/2019 11:59        Scheduled Meds:  aspirin  325 mg Oral Daily   atorvastatin  80 mg Oral q1800   enoxaparin (LOVENOX) injection  40 mg Subcutaneous Q24H   metoprolol succinate  100 mg Oral Daily   potassium chloride  30 mEq Oral BID   Continuous Infusions:   LOS: 2 days    Time spent: 25 minutes    Noe Gens, AG-ACNP-S  [employer]  [facility] 01/16/2019, 7:07 AM     Please page through AMION:  [email or web address] Password [email or web address] If 7PM-7AM, please contact night-coverage

## 2019-01-16 NOTE — Progress Notes (Signed)
Occupational Therapy Treatment Patient Details Name: Derrick Lawrence MRN: [id number] DOB: February 03, 1941 Today's Date: 01/16/2019    History of present illness Derrick Lawrence is a 78 y.o. male with medical history significant of HTN, HLD, ED who presents for right hand weakness and right leg weakness. MRI revealed L corona radiata infarct.    OT comments  Pt making progress towards OT goals. Pt continues to present with decreased strength, ROM, and coordination of RUE. Pt was dressed and sitting in chair upon arrival, reports requiring no assistance. Provided education, handout, and putty for putty hand exercises to increase strength and coordination of RUE. Pt performed putty exercises with supervision and VCs for full hand extension and finger separation. Continue to recommend dc home with OP OT to increase strength, ROM, and coordination of RUE. Will follow acutely as admitted.    Follow Up Recommendations  Outpatient OT;Supervision/Assistance - 24 hour    Equipment Recommendations  None recommended by OT    Recommendations for Other Services PT consult    Precautions / Restrictions Precautions Precautions: Fall Restrictions Weight Bearing Restrictions: No       Mobility Bed Mobility               General bed mobility comments: sitting in chair upon arrival  Transfers Overall transfer level: Modified independent               General transfer comment: increased time    Balance Overall balance assessment: Mild deficits observed, not formally tested                                         ADL either performed or assessed with clinical judgement   ADL Overall ADL's : Needs assistance/impaired                                       General ADL Comments: Pt sitting in chair upon arrival, dressed and reporting he is about to be dc'd.  Reviewed putty hand exercises handout.     Vision   Vision Assessment?: No apparent  visual deficits   Perception     Praxis      Cognition Arousal/Alertness: Awake/alert Behavior During Therapy: WFL for tasks assessed/performed;Impulsive Overall Cognitive Status: Within Functional Limits for tasks assessed                                          Exercises Exercises: Other exercises Other Exercises Other Exercises: Provided education and handout regarding putty hand exercises to increase coordination. Pt rolled putty into ball, flattened into pancake, rolled into snake, and pinched with index finger and thumb. Repeated exercises and pinched with each finger to increase finger separation and strength.    Shoulder Instructions       General Comments Pt grandson arrived during session, reivewed exercises with grandson    Pertinent Vitals/ Pain       Pain Assessment: Faces Faces Pain Scale: Hurts a little bit Pain Location: R shoulder Pain Descriptors / Indicators: Aching;Sore Pain Intervention(s): Limited activity within patient's tolerance;Monitored during session  Home Living  Prior Functioning/Environment              Frequency  Min 3X/week        Progress Toward Goals  OT Goals(current goals can now be found in the care plan section)  Progress towards OT goals: Progressing toward goals  Acute Rehab OT Goals Patient Stated Goal: go home OT Goal Formulation: With patient Time For Goal Achievement: 02/05/19 Potential to Achieve Goals: Good ADL Goals Pt Will Perform Eating: with modified independence;sitting Pt Will Perform Grooming: with modified independence;sitting Pt Will Perform Lower Body Dressing: with modified independence;sit to/from stand  Plan Discharge plan remains appropriate;Frequency remains appropriate    Co-evaluation                 AM-PAC OT "6 Clicks" Daily Activity     Outcome Measure   Help from another person eating meals?:  None Help from another person taking care of personal grooming?: A Little Help from another person toileting, which includes using toliet, bedpan, or urinal?: A Little Help from another person bathing (including washing, rinsing, drying)?: A Little Help from another person to put on and taking off regular upper body clothing?: A Little Help from another person to put on and taking off regular lower body clothing?: A Little 6 Click Score: 19    End of Session    OT Visit Diagnosis: Unsteadiness on feet (R26.81);Other abnormalities of gait and mobility (R26.89);Muscle weakness (generalized) (M62.81);Pain;Other symptoms and signs involving the nervous system (R29.898) Pain - Right/Left: Right Pain - part of body: Shoulder   Activity Tolerance Patient tolerated treatment well   Patient Left in chair;with call bell/phone within reach;with family/visitor present   Nurse Communication Mobility status        Time: [id number] OT Time Calculation (min): 16 min  Charges: OT General Charges $OT Visit: 1 Visit OT Treatments $Therapeutic Activity: 8-22 mins  Gus Rankin, OT Student  Gus Rankin 01/16/2019, 9:58 AM

## 2019-01-21 DIAGNOSIS — M25511 Pain in right shoulder: Secondary | ICD-10-CM | POA: Diagnosis not present

## 2019-01-21 DIAGNOSIS — G8929 Other chronic pain: Secondary | ICD-10-CM | POA: Diagnosis not present

## 2019-01-21 DIAGNOSIS — I10 Essential (primary) hypertension: Secondary | ICD-10-CM | POA: Diagnosis not present

## 2019-01-21 DIAGNOSIS — I639 Cerebral infarction, unspecified: Secondary | ICD-10-CM | POA: Diagnosis not present

## 2019-01-24 ENCOUNTER — Telehealth (HOSPITAL_COMMUNITY): Payer: Self-pay | Admitting: Specialist

## 2019-01-24 NOTE — Telephone Encounter (Signed)
pt's dtr called to cancel the appts since her father has gotten approved for HHPT.

## 2019-01-25 ENCOUNTER — Ambulatory Visit (HOSPITAL_COMMUNITY): Payer: PPO | Admitting: Specialist

## 2019-01-30 DIAGNOSIS — G8929 Other chronic pain: Secondary | ICD-10-CM | POA: Diagnosis not present

## 2019-01-30 DIAGNOSIS — I10 Essential (primary) hypertension: Secondary | ICD-10-CM | POA: Diagnosis not present

## 2019-01-30 DIAGNOSIS — Z859 Personal history of malignant neoplasm, unspecified: Secondary | ICD-10-CM | POA: Diagnosis not present

## 2019-01-30 DIAGNOSIS — I639 Cerebral infarction, unspecified: Secondary | ICD-10-CM | POA: Diagnosis not present

## 2019-01-30 DIAGNOSIS — E785 Hyperlipidemia, unspecified: Secondary | ICD-10-CM | POA: Diagnosis not present

## 2019-01-30 DIAGNOSIS — Z6835 Body mass index (BMI) 35.0-35.9, adult: Secondary | ICD-10-CM | POA: Diagnosis not present

## 2019-01-30 DIAGNOSIS — I69351 Hemiplegia and hemiparesis following cerebral infarction affecting right dominant side: Secondary | ICD-10-CM | POA: Diagnosis not present

## 2019-01-30 DIAGNOSIS — M1711 Unilateral primary osteoarthritis, right knee: Secondary | ICD-10-CM | POA: Diagnosis not present

## 2019-01-30 DIAGNOSIS — E669 Obesity, unspecified: Secondary | ICD-10-CM | POA: Diagnosis not present

## 2019-01-30 DIAGNOSIS — B0089 Other herpesviral infection: Secondary | ICD-10-CM | POA: Diagnosis not present

## 2019-01-30 DIAGNOSIS — Z7982 Long term (current) use of aspirin: Secondary | ICD-10-CM | POA: Diagnosis not present

## 2019-01-30 DIAGNOSIS — E871 Hypo-osmolality and hyponatremia: Secondary | ICD-10-CM | POA: Diagnosis not present

## 2019-01-30 DIAGNOSIS — M25511 Pain in right shoulder: Secondary | ICD-10-CM | POA: Diagnosis not present

## 2019-01-30 DIAGNOSIS — N138 Other obstructive and reflux uropathy: Secondary | ICD-10-CM | POA: Diagnosis not present

## 2019-01-30 DIAGNOSIS — Z87891 Personal history of nicotine dependence: Secondary | ICD-10-CM | POA: Diagnosis not present

## 2019-01-30 DIAGNOSIS — I700 Atherosclerosis of aorta: Secondary | ICD-10-CM | POA: Diagnosis not present

## 2019-01-30 DIAGNOSIS — N401 Enlarged prostate with lower urinary tract symptoms: Secondary | ICD-10-CM | POA: Diagnosis not present

## 2019-02-08 DIAGNOSIS — H34831 Tributary (branch) retinal vein occlusion, right eye, with macular edema: Secondary | ICD-10-CM | POA: Diagnosis not present

## 2019-02-10 DIAGNOSIS — M1711 Unilateral primary osteoarthritis, right knee: Secondary | ICD-10-CM | POA: Diagnosis not present

## 2019-02-10 DIAGNOSIS — G8929 Other chronic pain: Secondary | ICD-10-CM | POA: Diagnosis not present

## 2019-02-10 DIAGNOSIS — M25511 Pain in right shoulder: Secondary | ICD-10-CM | POA: Diagnosis not present

## 2019-02-10 DIAGNOSIS — E785 Hyperlipidemia, unspecified: Secondary | ICD-10-CM | POA: Diagnosis not present

## 2019-02-10 DIAGNOSIS — I700 Atherosclerosis of aorta: Secondary | ICD-10-CM | POA: Diagnosis not present

## 2019-02-10 DIAGNOSIS — I69351 Hemiplegia and hemiparesis following cerebral infarction affecting right dominant side: Secondary | ICD-10-CM | POA: Diagnosis not present

## 2019-02-10 DIAGNOSIS — E669 Obesity, unspecified: Secondary | ICD-10-CM | POA: Diagnosis not present

## 2019-02-10 DIAGNOSIS — I10 Essential (primary) hypertension: Secondary | ICD-10-CM | POA: Diagnosis not present

## 2019-02-10 DIAGNOSIS — N138 Other obstructive and reflux uropathy: Secondary | ICD-10-CM | POA: Diagnosis not present

## 2019-02-10 DIAGNOSIS — E871 Hypo-osmolality and hyponatremia: Secondary | ICD-10-CM | POA: Diagnosis not present

## 2019-02-10 DIAGNOSIS — Z7982 Long term (current) use of aspirin: Secondary | ICD-10-CM | POA: Diagnosis not present

## 2019-02-10 DIAGNOSIS — Z87891 Personal history of nicotine dependence: Secondary | ICD-10-CM | POA: Diagnosis not present

## 2019-02-10 DIAGNOSIS — B0089 Other herpesviral infection: Secondary | ICD-10-CM | POA: Diagnosis not present

## 2019-02-10 DIAGNOSIS — N401 Enlarged prostate with lower urinary tract symptoms: Secondary | ICD-10-CM | POA: Diagnosis not present

## 2019-02-10 DIAGNOSIS — I639 Cerebral infarction, unspecified: Secondary | ICD-10-CM | POA: Diagnosis not present

## 2019-02-10 DIAGNOSIS — Z859 Personal history of malignant neoplasm, unspecified: Secondary | ICD-10-CM | POA: Diagnosis not present

## 2019-02-10 DIAGNOSIS — Z6835 Body mass index (BMI) 35.0-35.9, adult: Secondary | ICD-10-CM | POA: Diagnosis not present

## 2019-02-15 DIAGNOSIS — M1711 Unilateral primary osteoarthritis, right knee: Secondary | ICD-10-CM | POA: Diagnosis not present

## 2019-02-15 DIAGNOSIS — I69351 Hemiplegia and hemiparesis following cerebral infarction affecting right dominant side: Secondary | ICD-10-CM | POA: Diagnosis not present

## 2019-02-15 DIAGNOSIS — E785 Hyperlipidemia, unspecified: Secondary | ICD-10-CM | POA: Diagnosis not present

## 2019-02-15 DIAGNOSIS — E871 Hypo-osmolality and hyponatremia: Secondary | ICD-10-CM | POA: Diagnosis not present

## 2019-02-15 DIAGNOSIS — M25511 Pain in right shoulder: Secondary | ICD-10-CM | POA: Diagnosis not present

## 2019-02-15 DIAGNOSIS — I700 Atherosclerosis of aorta: Secondary | ICD-10-CM | POA: Diagnosis not present

## 2019-02-15 DIAGNOSIS — I10 Essential (primary) hypertension: Secondary | ICD-10-CM | POA: Diagnosis not present

## 2019-02-15 DIAGNOSIS — E669 Obesity, unspecified: Secondary | ICD-10-CM | POA: Diagnosis not present

## 2019-02-15 DIAGNOSIS — N401 Enlarged prostate with lower urinary tract symptoms: Secondary | ICD-10-CM | POA: Diagnosis not present

## 2019-02-15 DIAGNOSIS — N138 Other obstructive and reflux uropathy: Secondary | ICD-10-CM | POA: Diagnosis not present

## 2019-02-15 DIAGNOSIS — B0089 Other herpesviral infection: Secondary | ICD-10-CM | POA: Diagnosis not present

## 2019-02-15 DIAGNOSIS — G8929 Other chronic pain: Secondary | ICD-10-CM | POA: Diagnosis not present

## 2019-03-02 ENCOUNTER — Telehealth: Payer: Self-pay | Admitting: Neurology

## 2019-03-02 NOTE — Telephone Encounter (Signed)
I called patient regarding 12/15 appt and was not able to get in touch or LVM. I called daughter and LVM requesting she or someone call back regarding r/s patient's 12/15 appt, or convert to MyChart, due to MD out.

## 2019-03-08 ENCOUNTER — Telehealth (INDEPENDENT_AMBULATORY_CARE_PROVIDER_SITE_OTHER): Payer: PPO | Admitting: Neurology

## 2019-03-08 ENCOUNTER — Other Ambulatory Visit: Payer: Self-pay

## 2019-03-08 DIAGNOSIS — I6381 Other cerebral infarction due to occlusion or stenosis of small artery: Secondary | ICD-10-CM

## 2019-03-08 NOTE — Progress Notes (Signed)
Virtual Visit via Video Note  I connected with Derrick Lawrence on 03/08/19 at 11:30 AM EST by a video enabled telemedicine application and verified that I am speaking with the correct person using two identifiers.  Location: Patient: at home with wife and duaghter Provider: at office Referring MD : dr Mannie Stabile Reason for referral : Stroke   I discussed the limitations of evaluation and management by telemedicine and the availability of in person appointments. The patient expressed understanding and agreed to proceed.This visit was scheduled as mychart video visit but patient was unable to log in due to technical difficulties but I was logged in wailting. Upon approval from him we did facetime video and audio visit instead on his daughter`s phone [phone]  History of Present Illness: Derrick Lawrence is a78 year pleasant elderly Caucasian male seen today for initial virtual video consult visit. History obtained from him, review of electronic medical records and I have personally reviewed imaging films in PACS.He has p/m/h of HT, Hyperlipidimia, arthritis, obesity who developed sudden onset right hand weakness while making a potato sald on 01/13/19.He had trouble grasping a fork and walking. Family also noticed slurred speech and NIH on admission was 3 but he presented outside TPA window.Ct head was unremarbable and MMRI showed small left corona radiata lacunar acute infarct.  CTangio of brain and neck did not reveal any larhe vessel stenosis and 2 D echo was unremarkable. LDL cholesterol was elevated at 86 mg % and HbA1c was 5.7.  Patient states that he is doing well.  He has finished outpatient therapy for his hand and fine motor skills improving though not back to normal.  He has been doing daily exercises at home as well.  He is tolerating aspirin well without bruising or bleeding.  He states his blood pressure is under good control.  He has tolerated increased dose of Lipitor to 40 mg well without  muscle aches and pains.  He has a follow-up appointment with his primary physician in February and will likely have a follow-up lipid profile checked at that visit.  He states his speech and facial weakness have improved completely.  He has no new complaints today.   Observations/Objective: Exam is limited due to constraints of video visit Pleasant mildly obese elderly Caucasian male not in distress. . Afebrile. Head is nontraumatic.   Neurological Exam ;  Awake  Alert oriented x 3. Normal speech and language.eye movements full without nystagmus.fundi were not visualized. Vision acuity and fields appear normal. Hearing is normal. Palatal movements are normal. Face symmetric. Tongue midline. Normal strength, tone, reflexes and coordination.mild diminished fine finger movements on right and orbits left over right upper extremity. Normal sensation. Gait is steady.  Assessment and Plan: 78 year old Caucasian male with left subcortical lacunar infarct in Oct 2020 from small vessel disease. Vascular risk factors of HT, Hyperlipidimia, mild obesity and age   Follow Up Instructions: I had a long d/w patient about his recent stroke, risk for recurrent stroke/TIAs, personally independently reviewed imaging studies and stroke evaluation results and answered questions.Continue Aspirin 325 mg daily for secondary stroke prevention and maintain strict control of hypertension with blood pressure goal below 130/90, diabetes with hemoglobin A1c goal below 6.5% and lipids with LDL cholesterol goal below 70 mg/dL. I also advised the patient to eat a healthy diet with plenty of whole grains, cereals, fruits and vegetables, exercise regularly and maintain ideal body weight Followup in the future with my nurse practitioner Janett Billow in 6 months  or call earlier if necessary    I discussed the assessment and treatment plan with the patient. The patient was provided an opportunity to ask questions and all were answered. The  patient agreed with the plan and demonstrated an understanding of the instructions.   The patient was advised to call back or seek an in-person evaluation if the symptoms worsen or if the condition fails to improve as anticipated.  I provided 45 minutes of non-face-to-face time during this encounter.   Antony Contras, MD

## 2019-04-05 DIAGNOSIS — H34831 Tributary (branch) retinal vein occlusion, right eye, with macular edema: Secondary | ICD-10-CM | POA: Diagnosis not present

## 2019-04-19 DIAGNOSIS — Z23 Encounter for immunization: Secondary | ICD-10-CM | POA: Diagnosis not present

## 2019-04-26 DIAGNOSIS — Z0000 Encounter for general adult medical examination without abnormal findings: Secondary | ICD-10-CM | POA: Diagnosis not present

## 2019-04-26 DIAGNOSIS — E7800 Pure hypercholesterolemia, unspecified: Secondary | ICD-10-CM | POA: Diagnosis not present

## 2019-04-26 DIAGNOSIS — N401 Enlarged prostate with lower urinary tract symptoms: Secondary | ICD-10-CM | POA: Diagnosis not present

## 2019-04-26 DIAGNOSIS — N138 Other obstructive and reflux uropathy: Secondary | ICD-10-CM | POA: Diagnosis not present

## 2019-04-26 DIAGNOSIS — I10 Essential (primary) hypertension: Secondary | ICD-10-CM | POA: Diagnosis not present

## 2019-04-26 DIAGNOSIS — Z8673 Personal history of transient ischemic attack (TIA), and cerebral infarction without residual deficits: Secondary | ICD-10-CM | POA: Diagnosis not present

## 2019-06-21 DIAGNOSIS — Z8673 Personal history of transient ischemic attack (TIA), and cerebral infarction without residual deficits: Secondary | ICD-10-CM | POA: Diagnosis not present

## 2019-06-21 DIAGNOSIS — R5381 Other malaise: Secondary | ICD-10-CM | POA: Diagnosis not present

## 2019-06-21 DIAGNOSIS — F321 Major depressive disorder, single episode, moderate: Secondary | ICD-10-CM | POA: Diagnosis not present

## 2019-06-21 DIAGNOSIS — R5383 Other fatigue: Secondary | ICD-10-CM | POA: Diagnosis not present

## 2019-06-21 DIAGNOSIS — E876 Hypokalemia: Secondary | ICD-10-CM | POA: Diagnosis not present

## 2019-06-21 DIAGNOSIS — I10 Essential (primary) hypertension: Secondary | ICD-10-CM | POA: Diagnosis not present

## 2019-06-21 DIAGNOSIS — E7800 Pure hypercholesterolemia, unspecified: Secondary | ICD-10-CM | POA: Diagnosis not present

## 2019-07-05 DIAGNOSIS — H34831 Tributary (branch) retinal vein occlusion, right eye, with macular edema: Secondary | ICD-10-CM | POA: Diagnosis not present

## 2019-07-22 DIAGNOSIS — I10 Essential (primary) hypertension: Secondary | ICD-10-CM | POA: Diagnosis not present

## 2019-07-22 DIAGNOSIS — E785 Hyperlipidemia, unspecified: Secondary | ICD-10-CM | POA: Diagnosis not present

## 2019-07-22 DIAGNOSIS — I639 Cerebral infarction, unspecified: Secondary | ICD-10-CM | POA: Diagnosis not present

## 2019-07-22 DIAGNOSIS — K5900 Constipation, unspecified: Secondary | ICD-10-CM | POA: Diagnosis not present

## 2019-08-03 DIAGNOSIS — I10 Essential (primary) hypertension: Secondary | ICD-10-CM | POA: Diagnosis not present

## 2019-08-03 DIAGNOSIS — I639 Cerebral infarction, unspecified: Secondary | ICD-10-CM | POA: Diagnosis not present

## 2019-08-03 DIAGNOSIS — E785 Hyperlipidemia, unspecified: Secondary | ICD-10-CM | POA: Diagnosis not present

## 2019-08-03 DIAGNOSIS — K5900 Constipation, unspecified: Secondary | ICD-10-CM | POA: Diagnosis not present

## 2019-09-07 ENCOUNTER — Encounter: Payer: Self-pay | Admitting: Adult Health

## 2019-09-07 ENCOUNTER — Ambulatory Visit: Payer: PPO | Admitting: Adult Health

## 2019-09-07 VITALS — BP 140/83 | HR 61 | Ht 68.0 in | Wt 233.0 lb

## 2019-09-07 DIAGNOSIS — I639 Cerebral infarction, unspecified: Secondary | ICD-10-CM

## 2019-09-07 DIAGNOSIS — E785 Hyperlipidemia, unspecified: Secondary | ICD-10-CM

## 2019-09-07 DIAGNOSIS — I10 Essential (primary) hypertension: Secondary | ICD-10-CM

## 2019-09-07 NOTE — Progress Notes (Signed)
[facility] [address] [place]. [place] [address] [phone]       STROKE FOLLOW UP NOTE  Derrick. Derrick Lawrence [facility] Date of Birth:  30-Apr-1940 Medical Record Number:  [id number]   Reason for Referral:  stroke follow up [place] provider: Dr. Leonie Man   SUBJECTIVE:   CHIEF COMPLAINT:  Chief Complaint  Patient presents with  . Follow-up    stroke fu, treatment rm, right arm and leg weakness     HPI:   Today, 09/07/2019, Derrick Lawrence returns for follow-up regarding left CR stroke in 12/2018.  He has been doing well since prior visit 6 months ago with residual right sided numbness and occasional right hand weakness but endorses improvement.  Continues on aspirin and atorvastatin for secondary stroke prevention.  Blood pressure today 140/83. No concerns at this time.   History provided for reference purposes only Initial virtual visit 03/11/2019 Dr. Leonie Man: Derrick Lawrence is a78 year pleasant elderly Caucasian male seen today for initial virtual video consult visit. History obtained from him, review of electronic medical records and I have personally reviewed imaging films in PACS.He has p/m/h of HT, Hyperlipidimia, arthritis, obesity who developed sudden onset right hand weakness while making a potato sald on 01/13/19.He had trouble grasping a fork and walking. Family also noticed slurred speech and NIH on admission was 3 but he presented outside TPA window.Ct head was unremarbable and MMRI showed small left corona radiata lacunar acute infarct.  CTangio of brain and neck did not reveal any larhe vessel stenosis and 2 D echo was unremarkable. LDL cholesterol was elevated at 86 mg % and HbA1c was 5.7.  Patient states that he is doing well.  He has finished outpatient therapy for his hand and fine motor skills improving though not back to normal.  He has been doing daily exercises at home as well.  He is tolerating aspirin well without bruising or bleeding.  He states his blood pressure is  under good control.  He has tolerated increased dose of Lipitor to 40 mg well without muscle aches and pains.  He has a follow-up appointment with his primary physician in February and will likely have a follow-up lipid profile checked at that visit.  He states his speech and facial weakness have improved completely.  He has no new complaints today.    ROS:   14 system review of systems performed and negative with exception of numbness/tingling and weakness  PMH:  Past Medical History:  Diagnosis Date  . ED (erectile dysfunction)   . H/O nonmelanoma skin cancer   . Hyperlipidemia   . Hypertension     PSH:  Past Surgical History:  Procedure Laterality Date  . CATARACT EXTRACTION Right   . HERNIA REPAIR     Abdominal  . SKIN CANCER EXCISION      Social History:  Social History   Socioeconomic History  . Marital status: Single    Spouse name: Not on file  . Number of children: Not on file  . Years of education: Not on file  . Highest education level: Not on file  Occupational History  . Not on file  Tobacco Use  . Smoking status: Never Smoker  . Smokeless tobacco: Never Used  Substance and Sexual Activity  . Alcohol use: No  . Drug use: No  . Sexual activity: Not on file  Other Topics Concern  . Not on file  Social History Narrative  . Not on file   Social Determinants of  Health   Financial Resource Strain:   . Difficulty of Paying Living Expenses:   Food Insecurity:   . Worried About Charity fundraiser in the Last Year:   . Arboriculturist in the Last Year:   Transportation Needs:   . Film/video editor (Medical):   Marland Kitchen Lack of Transportation (Non-Medical):   Physical Activity:   . Days of Exercise per Week:   . Minutes of Exercise per Session:   Stress:   . Feeling of Stress :   Social Connections:   . Frequency of Communication with Friends and Family:   . Frequency of Social Gatherings with Friends and Family:   . Attends Religious Services:   .  Active Member of Clubs or Organizations:   . Attends Archivist Meetings:   Marland Kitchen Marital Status:   Intimate Partner Violence:   . Fear of Current or Ex-Partner:   . Emotionally Abused:   Marland Kitchen Physically Abused:   . Sexually Abused:     Family History:  Family History  Problem Relation Age of Onset  . Cancer - Lung Mother   . Stroke Son     Medications:   Current Outpatient Medications on File Prior to Visit  Medication Sig Dispense Refill  . amLODipine (NORVASC) 5 MG tablet Take 1 tablet by mouth once daily for blood pressure control    . aspirin 325 MG tablet Take 1 tablet (325 mg total) by mouth daily.    Marland Kitchen atorvastatin (LIPITOR) 40 MG tablet Take 1 tablet (40 mg total) by mouth daily. 30 tablet 11  . Cyanocobalamin (B-12) 2500 MCG TABS Take by mouth.    . doxazosin (CARDURA) 8 MG tablet Take 1 tablet (8 mg total) by mouth at bedtime. (Patient taking differently: Take 8 mg by mouth every morning. ) 90 tablet 3  . fluticasone (FLONASE) 50 MCG/ACT nasal spray Place 2 sprays into the nose daily. (Patient taking differently: Place 2 sprays into both nostrils daily. ) 1 g 11  . hydrochlorothiazide (HYDRODIURIL) 25 MG tablet Take 1 tablet (25 mg total) by mouth daily. (Patient taking differently: Take 25 mg by mouth every morning. ) 90 tablet 3  . metoprolol succinate (TOPROL-XL) 100 MG 24 hr tablet Take 100 mg by mouth daily.    . quinapril (ACCUPRIL) 40 MG tablet Take 1 tablet (40 mg total) by mouth at bedtime. (Patient taking differently: Take 40 mg by mouth every morning. ) 90 tablet 3  . triamcinolone lotion (KENALOG) 0.1 % 1 application topical Select Frequency    . valACYclovir (VALTREX) 1000 MG tablet Take 1,000 mg by mouth See admin instructions. Take two tablets (2000 mg) by mouth at onset of cold sore, may take two tablets (2000 mg) 12 hours later if still needed.     No current facility-administered medications on file prior to visit.    Allergies:  No Known  Allergies    OBJECTIVE:  Physical Exam  Vitals:   09/07/19 1022  BP: 140/83  Pulse: 61  Weight: 233 lb (105.7 kg)  Height: 5\' 8"  (1.727 m)   Body mass index is 35.43 kg/m. No exam data present   General: well developed, well nourished,  pleasant elderly Caucasian male, seated, in no evident distress Head: head normocephalic and atraumatic.   Neck: supple with no carotid or supraclavicular bruits Cardiovascular: regular rate and rhythm, no murmurs Musculoskeletal: no deformity Skin:  no rash/petichiae Vascular:  Normal pulses all extremities   Neurologic Exam Mental  Status: Awake and fully alert. Oriented to place and time. Recent and remote memory intact. Attention span, concentration and fund of knowledge appropriate. Mood and affect appropriate.  Cranial Nerves: Pupils equal, briskly reactive to light. Extraocular movements full without nystagmus. Visual fields full to confrontation. Hearing intact. Facial sensation intact. Face, tongue, palate moves normally and symmetrically.  Motor: Normal bulk and tone. Normal strength in all tested extremity muscles except slightly decreased right hand dexterity Sensory.: intact to touch , pinprick , position and vibratory sensation.  Coordination: Rapid alternating movements normal in all extremities except decreased right hand dexterity. Finger-to-nose and heel-to-shin performed accurately bilaterally. Gait and Station: Arises from chair without difficulty. Stance is normal. Gait demonstrates normal stride length and balance Reflexes: 1+ and symmetric. Toes downgoing.         ASSESSMENT: 79 year old Caucasian male with left subcortical lacunar infarct in Oct 2020 from small vessel disease. Vascular risk factors of HT, Hyperlipidimia, mild obesity and age     PLAN:  1. Left CR stroke: Continue aspirin 325 mg daily  and atorvastatin 40 mg daily for secondary stroke prevention. Maintain strict control of hypertension with blood  pressure goal below 130/90, diabetes with hemoglobin A1c goal below 6.5% and cholesterol with LDL cholesterol (bad cholesterol) goal below 70 mg/dL.  I also advised the patient to eat a healthy diet with plenty of whole grains, cereals, fruits and vegetables, exercise regularly with at least 30 minutes of continuous activity daily and maintain ideal body weight. 2. HTN: Stable.  Continue to follow with PCP for monitoring and management 3. HLD: Continuation of atorvastatin and continue to follow with PCP for prescribing, monitoring management   Recommended 70-month follow-up of per patient request, follow-up on an as-needed basis due to difficulty traveling to [place].  Advised to follow-up as needed and ensure ongoing follow-up with PCP for stroke prevention management   CC: [facility] provider Dr. Dineen Kid, Jama Flavors, MD   I spent 25 minutes of face-to-face and non-face-to-face time with patient.  This included previsit chart review, lab review, study review, order entry, electronic health record documentation, patient education regarding recent stroke, residual deficits, importance of managing stroke risk factors and answered all questions to patient satisfaction     Frann Rider, [facility]  [facility] [address] [place], [place] [address]  Phone [phone] Fax [phone] Note: This document was prepared with digital dictation and possible smart phrase technology. Any transcriptional errors that result from this process are unintentional.

## 2019-09-07 NOTE — Patient Instructions (Addendum)
Continue aspirin 325 mg daily  and lipitor  for secondary stroke prevention  Continue to follow up with PCP regarding cholesterol and blood pressure management   Continue to monitor blood pressure at home  Maintain strict control of hypertension with blood pressure goal below 130/90, diabetes with hemoglobin A1c goal below 6.5% and cholesterol with LDL cholesterol (bad cholesterol) goal below 70 mg/dL. I also advised the patient to eat a healthy diet with plenty of whole grains, cereals, fruits and vegetables, exercise regularly and maintain ideal body weight.  Follow up as needed       Thank you for coming to see [place] at [facility]. I hope we have been able to provide you high quality care today.  You may receive a patient satisfaction survey over the next few weeks. We would appreciate your feedback and comments so that we may continue to improve ourselves and the health of our patients.

## 2019-09-09 NOTE — Progress Notes (Signed)
I agree with the above plan 

## 2019-10-25 DIAGNOSIS — E7800 Pure hypercholesterolemia, unspecified: Secondary | ICD-10-CM | POA: Diagnosis not present

## 2019-10-25 DIAGNOSIS — N401 Enlarged prostate with lower urinary tract symptoms: Secondary | ICD-10-CM | POA: Diagnosis not present

## 2019-10-25 DIAGNOSIS — N138 Other obstructive and reflux uropathy: Secondary | ICD-10-CM | POA: Diagnosis not present

## 2019-10-25 DIAGNOSIS — I10 Essential (primary) hypertension: Secondary | ICD-10-CM | POA: Diagnosis not present

## 2020-08-15 DIAGNOSIS — F321 Major depressive disorder, single episode, moderate: Secondary | ICD-10-CM | POA: Diagnosis not present

## 2020-08-15 DIAGNOSIS — N401 Enlarged prostate with lower urinary tract symptoms: Secondary | ICD-10-CM | POA: Diagnosis not present

## 2020-08-15 DIAGNOSIS — N138 Other obstructive and reflux uropathy: Secondary | ICD-10-CM | POA: Diagnosis not present

## 2020-08-15 DIAGNOSIS — I10 Essential (primary) hypertension: Secondary | ICD-10-CM | POA: Diagnosis not present

## 2020-10-03 IMAGING — CT CT ANGIO NECK
1 of 7 series · 13 of 46 positions shown, 17 images · IV contrast (OMNI)
Comparison: Prior CT and MRI from 01/13/2019 and 01/14/2019.

CLINICAL DATA: Follow-up examination for acute stroke, right-sided
weakness.

EXAM:
CT ANGIOGRAPHY HEAD AND NECK
TECHNIQUE: Multidetector CT imaging of the head and neck was performed using
the standard protocol during bolus administration of intravenous
contrast. Multiplanar CT image reconstructions and MIPs were
obtained to evaluate the vascular anatomy. Carotid stenosis
measurements (when applicable) are obtained utilizing NASCET
criteria, using the distal internal carotid diameter as the
denominator.
CONTRAST:  100mL OMNIPAQUE IOHEXOL 350 MG/ML SOLN

[Series 7: thin · axial · 0.46mm/px · z∈[+244,+554]mm · 13 of 715 slices shown, 17 images]
[im 63/715  soft-tissue]
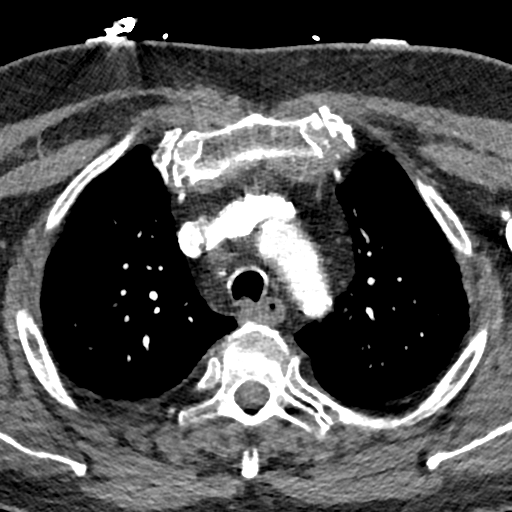
[im 63/715  bone]
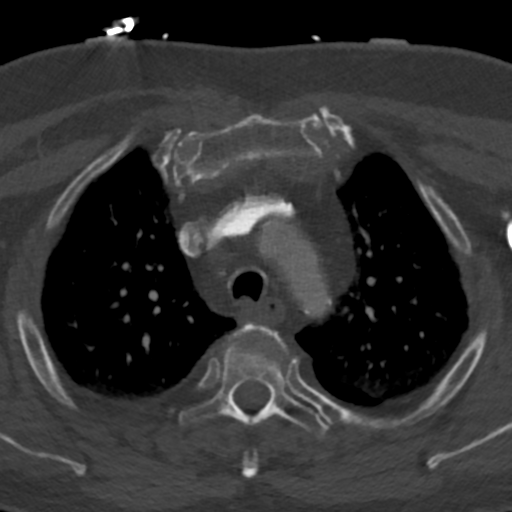
[im 125/715  soft-tissue]
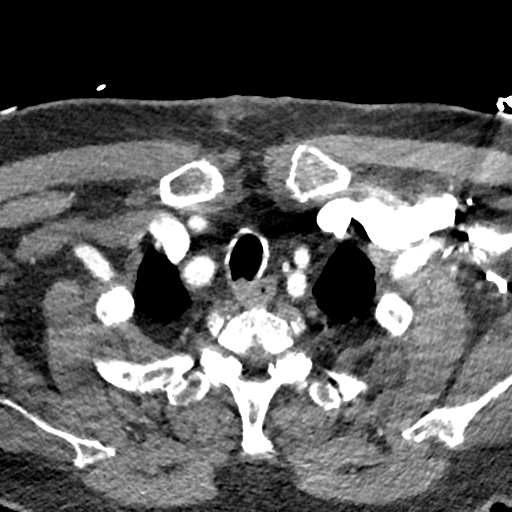
[im 187/715  soft-tissue]
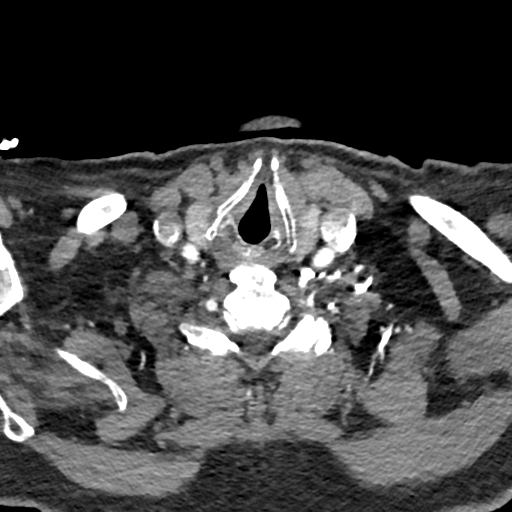
[im 249/715  soft-tissue]
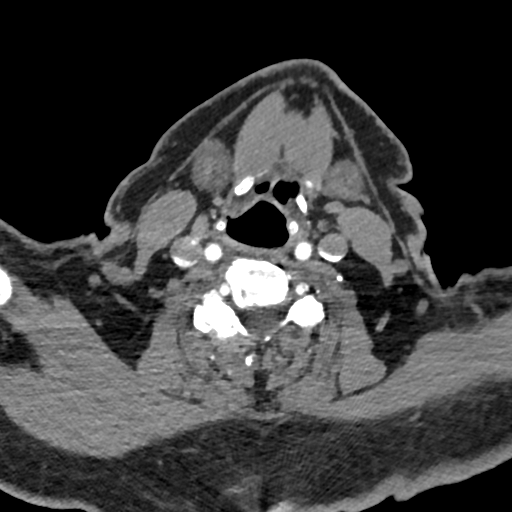
[im 311/715  soft-tissue]
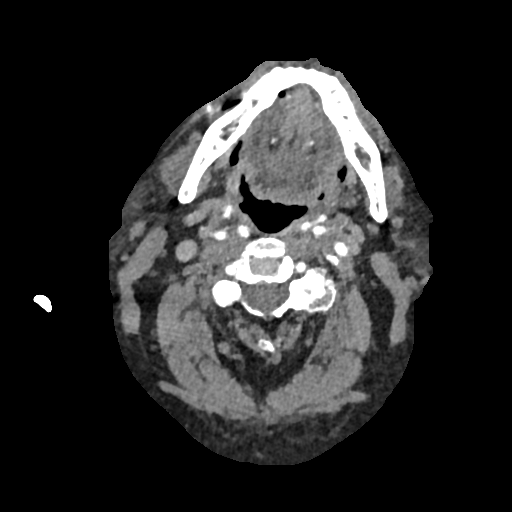
[im 373/715  soft-tissue]
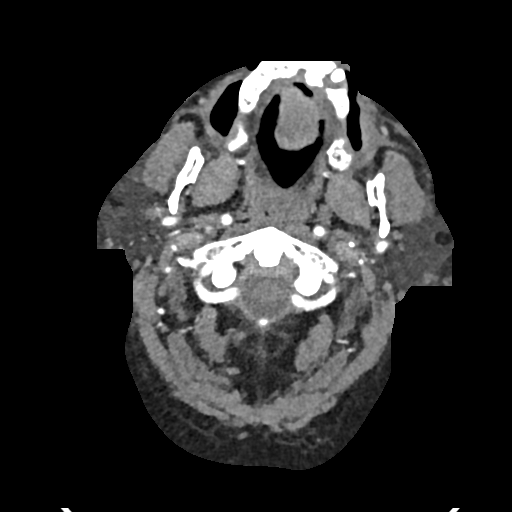
[im 435/715  soft-tissue]
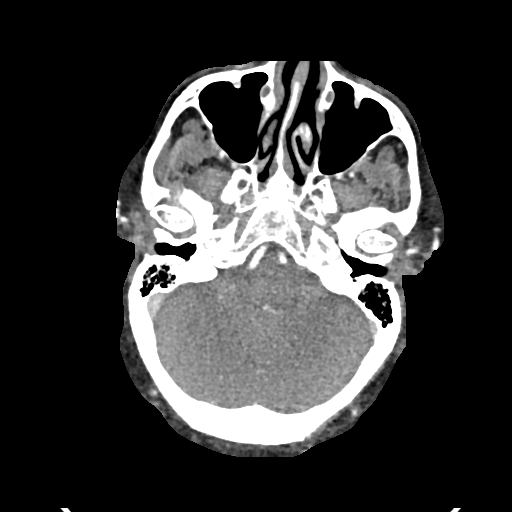
[im 497/715  soft-tissue]
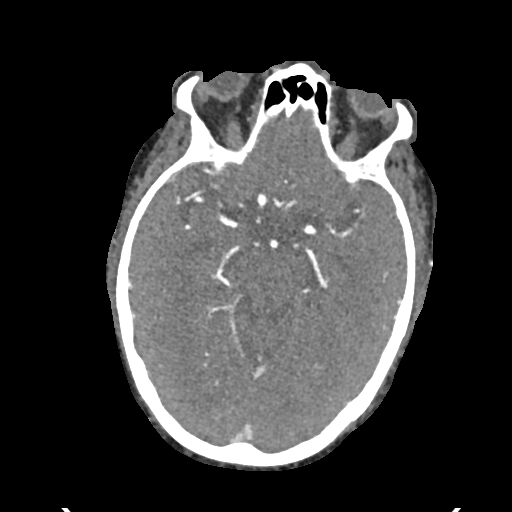
[im 559/715  soft-tissue]
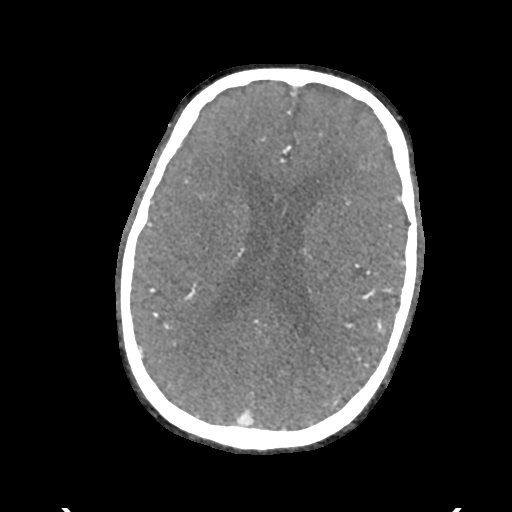
[im 559/715  bone]
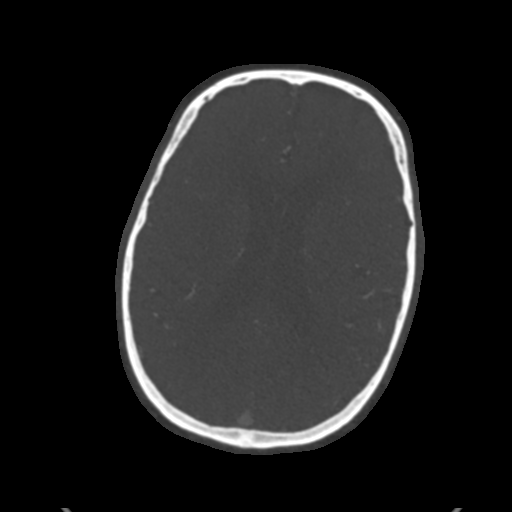
[im 590/715  lung]
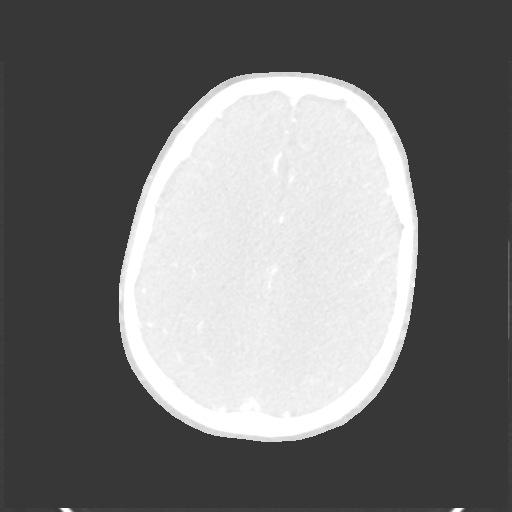
[im 621/715  soft-tissue]
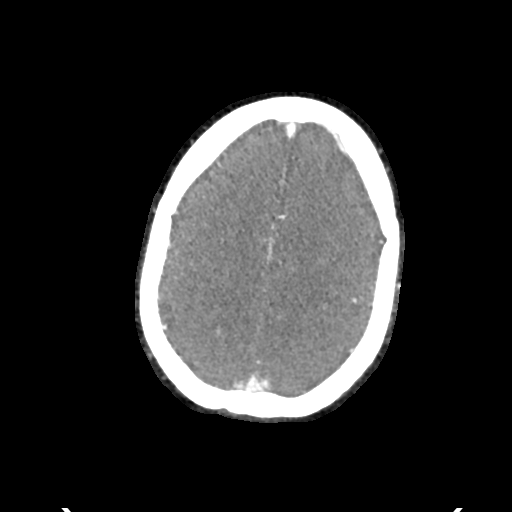
[im 621/715  lung]
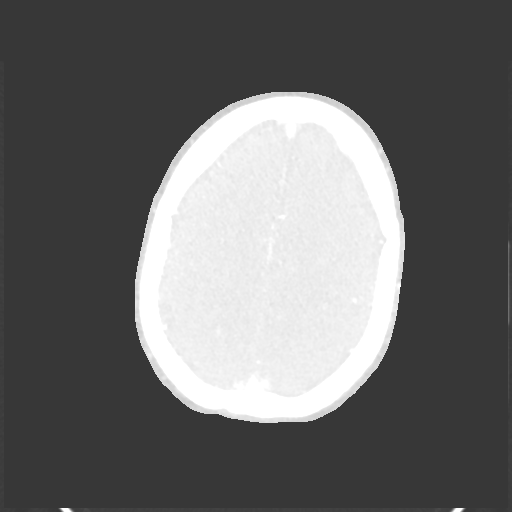
[im 652/715  lung]
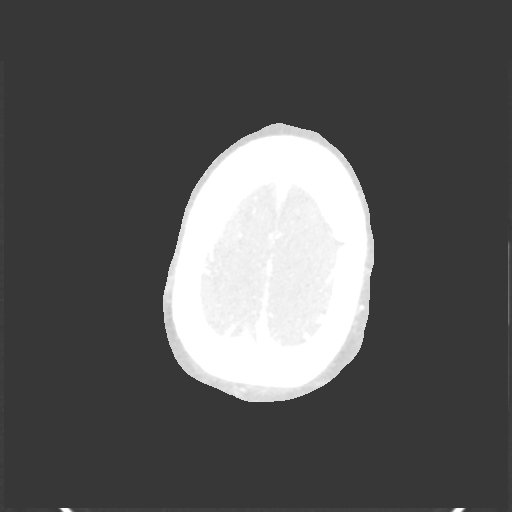
[im 683/715  soft-tissue]
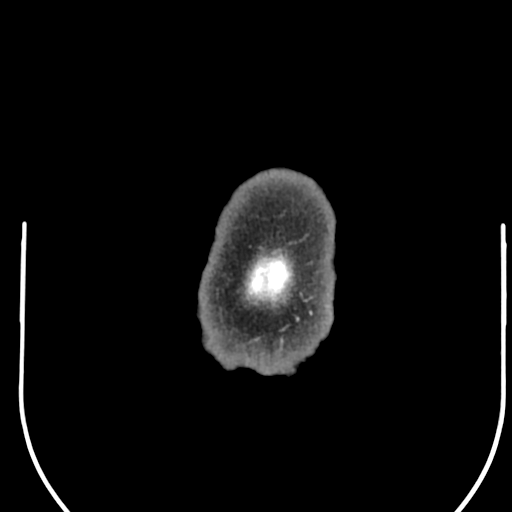
[im 683/715  lung]
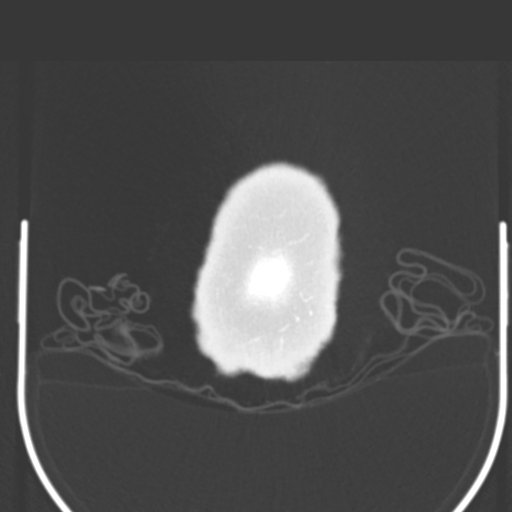

[13 of 46 positions shown; findings below may reference images not displayed]

FINDINGS: CTA NECK FINDINGS

Aortic arch: Visualized aortic arch of normal caliber with normal
branch pattern. Mild-to-moderate atherosclerotic change within the
arch itself. No hemodynamically significant stenosis about the
origin of the great vessels. Visualized subclavian arteries widely
patent.

Right carotid system: Right common and internal carotid arteries
mildly tortuous but are widely patent to the skull base without
stenosis, dissection or occlusion. No significant atheromatous
narrowing seen about the right carotid bifurcation.

Left carotid system: Left common and internal carotid arteries are
mildly tortuous but are widely patent to the skull base without
stenosis, dissection, or occlusion. No significant atheromatous
narrowing about the left carotid bifurcation.

Vertebral arteries: Left vertebral artery arises directly from the
aortic arch. Right vertebral artery rises from the right subclavian
artery. Mild plaque at the origin of the right vertebral artery
without significant stenosis. Vertebral arteries otherwise widely
patent within the neck without stenosis, dissection, or occlusion.

Skeleton: No acute osseous finding. No discrete lytic or blastic
osseous lesions. Bulky anterior osteophytic spurring noted at
C[DATE]-C6-7. Moderate to advanced multilevel facet arthrosis seen
throughout the cervical spine.

Other neck: No other acute soft tissue abnormality within the neck.
No mass lesion or adenopathy.

Upper chest: Trace layering right pleural effusion. Visualized upper
chest demonstrates no other acute finding.

Review of the MIP images confirms the above findings

CTA HEAD FINDINGS

Anterior circulation: Petrous segments widely patent bilaterally.
Scattered atheromatous plaque within the cavernous/supraclinoid ICAs
without hemodynamically significant stenosis. A1 segments widely
patent. Normal anterior communicating artery complex. Anterior
cerebral arteries widely patent to their distal aspects. M1 segments
widely patent without stenosis or occlusion. Normal MCA
bifurcations. Distal MCA branches well perfused and symmetric.

Posterior circulation: Mild focal non stenotic plaque noted within
the proximal right V4 segment. Vertebral arteries otherwise
unremarkable widely patent to the vertebrobasilar junction. Both
picas patent bilaterally. Basilar widely patent to its distal aspect
without stenosis. Superior cerebral arteries patent bilaterally.
Both posterior cerebral arteries primarily supplied via the basilar
well perfused to their distal aspects.

Venous sinuses: Patent.

Anatomic variants: None significant.

Review of the MIP images confirms the above findings
IMPRESSION: 1. Negative CTA with no evidence for large vessel occlusion.
2. Mild atheromatous change involving the major arterial vasculature
of the head and neck as above without hemodynamically significant or
correctable stenosis.
3. Small layering right pleural effusion.

## 2020-10-05 IMAGING — DX DG CHEST 1V PORT
1 series · 1 of 1 positions shown · non-contrast
Comparison: January 14, 2019

CLINICAL DATA: Hypertension

EXAM:
PORTABLE CHEST 1 VIEW

[chest ap]
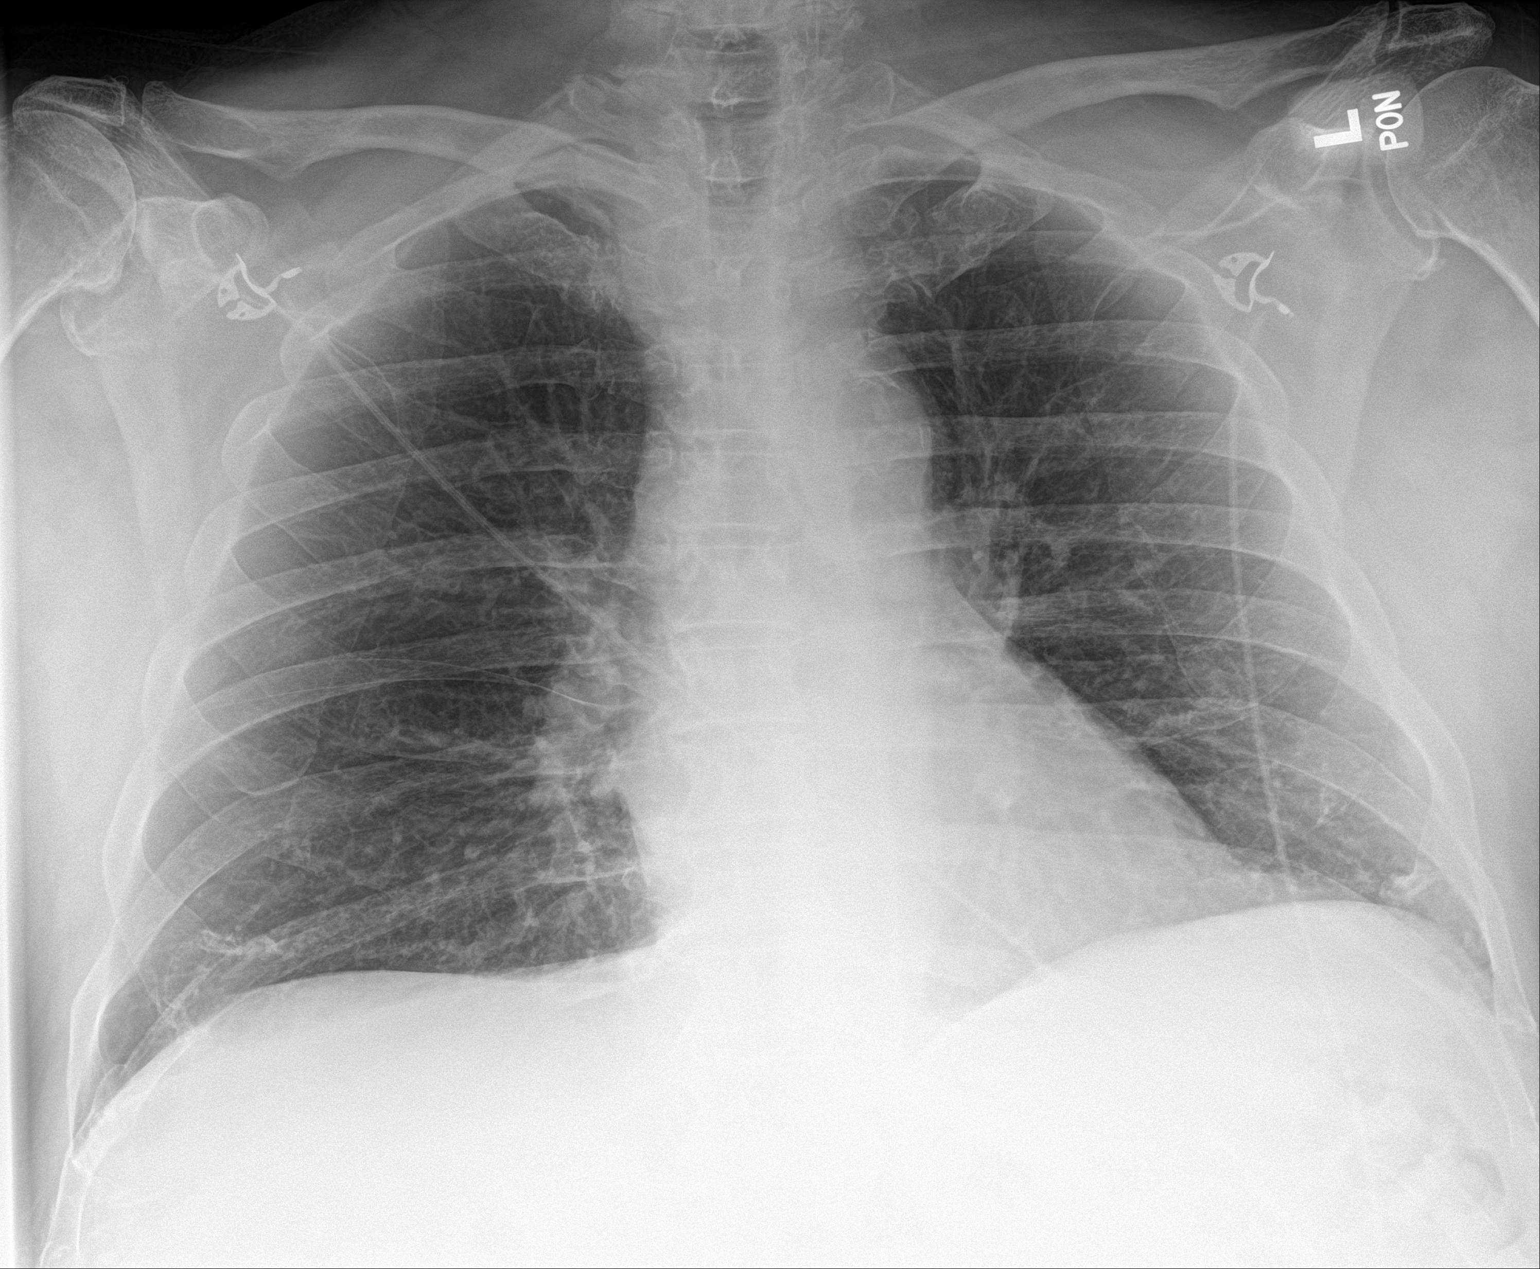

[1 of 1 positions shown; findings below may reference images not displayed]

FINDINGS: No edema or consolidation. No pleural effusion evident. Heart size
and pulmonary vascularity are normal. No adenopathy. There is aortic
atherosclerosis. There are foci of degenerative change in the lower
thoracic region.
IMPRESSION: No edema or consolidation. Stable cardiac silhouette. No adenopathy.
Aortic Atherosclerosis (R1SJU-J40.0).

## 2020-11-21 DIAGNOSIS — N401 Enlarged prostate with lower urinary tract symptoms: Secondary | ICD-10-CM | POA: Diagnosis not present

## 2020-11-21 DIAGNOSIS — I10 Essential (primary) hypertension: Secondary | ICD-10-CM | POA: Diagnosis not present

## 2020-11-21 DIAGNOSIS — F439 Reaction to severe stress, unspecified: Secondary | ICD-10-CM | POA: Diagnosis not present

## 2020-11-21 DIAGNOSIS — F32A Depression, unspecified: Secondary | ICD-10-CM | POA: Diagnosis not present

## 2020-11-21 DIAGNOSIS — R200 Anesthesia of skin: Secondary | ICD-10-CM | POA: Diagnosis not present

## 2020-11-21 DIAGNOSIS — E785 Hyperlipidemia, unspecified: Secondary | ICD-10-CM | POA: Diagnosis not present

## 2020-11-21 DIAGNOSIS — Z79899 Other long term (current) drug therapy: Secondary | ICD-10-CM | POA: Diagnosis not present

## 2020-11-21 DIAGNOSIS — F418 Other specified anxiety disorders: Secondary | ICD-10-CM | POA: Diagnosis not present

## 2020-11-21 DIAGNOSIS — E7800 Pure hypercholesterolemia, unspecified: Secondary | ICD-10-CM | POA: Diagnosis not present

## 2020-11-21 DIAGNOSIS — F321 Major depressive disorder, single episode, moderate: Secondary | ICD-10-CM | POA: Diagnosis not present

## 2020-11-21 DIAGNOSIS — N138 Other obstructive and reflux uropathy: Secondary | ICD-10-CM | POA: Diagnosis not present

## 2020-11-21 DIAGNOSIS — R5383 Other fatigue: Secondary | ICD-10-CM | POA: Diagnosis not present

## 2020-11-21 DIAGNOSIS — Z0000 Encounter for general adult medical examination without abnormal findings: Secondary | ICD-10-CM | POA: Diagnosis not present

## 2020-11-21 DIAGNOSIS — Z87828 Personal history of other (healed) physical injury and trauma: Secondary | ICD-10-CM | POA: Diagnosis not present

## 2020-11-21 DIAGNOSIS — Z0001 Encounter for general adult medical examination with abnormal findings: Secondary | ICD-10-CM | POA: Diagnosis not present

## 2021-01-02 DIAGNOSIS — Z23 Encounter for immunization: Secondary | ICD-10-CM | POA: Diagnosis not present

## 2021-01-02 DIAGNOSIS — I10 Essential (primary) hypertension: Secondary | ICD-10-CM | POA: Diagnosis not present

## 2021-01-09 DIAGNOSIS — Z23 Encounter for immunization: Secondary | ICD-10-CM | POA: Diagnosis not present

## 2021-01-16 DIAGNOSIS — R739 Hyperglycemia, unspecified: Secondary | ICD-10-CM | POA: Diagnosis not present

## 2021-01-16 DIAGNOSIS — M47815 Spondylosis without myelopathy or radiculopathy, thoracolumbar region: Secondary | ICD-10-CM | POA: Diagnosis not present

## 2021-01-16 DIAGNOSIS — G629 Polyneuropathy, unspecified: Secondary | ICD-10-CM | POA: Diagnosis not present

## 2021-01-16 DIAGNOSIS — B028 Zoster with other complications: Secondary | ICD-10-CM | POA: Diagnosis not present

## 2021-01-28 DIAGNOSIS — M5416 Radiculopathy, lumbar region: Secondary | ICD-10-CM | POA: Diagnosis not present

## 2021-01-29 ENCOUNTER — Other Ambulatory Visit: Payer: Self-pay | Admitting: Neurosurgery

## 2021-01-29 ENCOUNTER — Other Ambulatory Visit (HOSPITAL_COMMUNITY): Payer: Self-pay | Admitting: Neurosurgery

## 2021-01-29 DIAGNOSIS — M5416 Radiculopathy, lumbar region: Secondary | ICD-10-CM

## 2021-02-11 ENCOUNTER — Other Ambulatory Visit: Payer: Self-pay

## 2021-02-11 ENCOUNTER — Ambulatory Visit (HOSPITAL_COMMUNITY)
Admission: RE | Admit: 2021-02-11 | Discharge: 2021-02-11 | Disposition: A | Discharge status: Home / Self Care | Payer: PPO | Source: Ambulatory Visit | Attending: Neurosurgery | Admitting: Neurosurgery

## 2021-02-11 DIAGNOSIS — I69331 Monoplegia of upper limb following cerebral infarction affecting right dominant side: Secondary | ICD-10-CM | POA: Diagnosis not present

## 2021-02-11 DIAGNOSIS — M5416 Radiculopathy, lumbar region: Secondary | ICD-10-CM | POA: Diagnosis present

## 2021-02-11 DIAGNOSIS — R9431 Abnormal electrocardiogram [ECG] [EKG]: Secondary | ICD-10-CM | POA: Diagnosis not present

## 2021-02-11 DIAGNOSIS — N401 Enlarged prostate with lower urinary tract symptoms: Secondary | ICD-10-CM | POA: Diagnosis present

## 2021-02-11 DIAGNOSIS — I472 Ventricular tachycardia, unspecified: Secondary | ICD-10-CM | POA: Diagnosis present

## 2021-02-11 DIAGNOSIS — E7800 Pure hypercholesterolemia, unspecified: Secondary | ICD-10-CM | POA: Diagnosis not present

## 2021-02-11 DIAGNOSIS — G8929 Other chronic pain: Secondary | ICD-10-CM | POA: Diagnosis present

## 2021-02-11 DIAGNOSIS — E871 Hypo-osmolality and hyponatremia: Secondary | ICD-10-CM | POA: Diagnosis present

## 2021-02-11 DIAGNOSIS — E860 Dehydration: Secondary | ICD-10-CM | POA: Diagnosis present

## 2021-02-11 DIAGNOSIS — M545 Low back pain, unspecified: Secondary | ICD-10-CM | POA: Diagnosis not present

## 2021-02-11 DIAGNOSIS — R109 Unspecified abdominal pain: Secondary | ICD-10-CM | POA: Diagnosis not present

## 2021-02-11 DIAGNOSIS — R008 Other abnormalities of heart beat: Secondary | ICD-10-CM | POA: Diagnosis not present

## 2021-02-11 DIAGNOSIS — I10 Essential (primary) hypertension: Secondary | ICD-10-CM | POA: Diagnosis present

## 2021-02-11 DIAGNOSIS — R5381 Other malaise: Secondary | ICD-10-CM | POA: Diagnosis present

## 2021-02-11 DIAGNOSIS — R0602 Shortness of breath: Secondary | ICD-10-CM | POA: Diagnosis not present

## 2021-02-11 DIAGNOSIS — E785 Hyperlipidemia, unspecified: Secondary | ICD-10-CM | POA: Diagnosis present

## 2021-02-11 DIAGNOSIS — Z5309 Procedure and treatment not carried out because of other contraindication: Secondary | ICD-10-CM | POA: Diagnosis not present

## 2021-02-11 DIAGNOSIS — Z85828 Personal history of other malignant neoplasm of skin: Secondary | ICD-10-CM | POA: Diagnosis not present

## 2021-02-11 DIAGNOSIS — R338 Other retention of urine: Secondary | ICD-10-CM | POA: Diagnosis not present

## 2021-02-11 DIAGNOSIS — K810 Acute cholecystitis: Secondary | ICD-10-CM | POA: Diagnosis present

## 2021-02-11 DIAGNOSIS — I639 Cerebral infarction, unspecified: Secondary | ICD-10-CM | POA: Diagnosis not present

## 2021-02-11 DIAGNOSIS — Z79899 Other long term (current) drug therapy: Secondary | ICD-10-CM | POA: Diagnosis not present

## 2021-02-11 DIAGNOSIS — K8000 Calculus of gallbladder with acute cholecystitis without obstruction: Secondary | ICD-10-CM | POA: Diagnosis present

## 2021-02-11 DIAGNOSIS — Z823 Family history of stroke: Secondary | ICD-10-CM | POA: Diagnosis not present

## 2021-02-11 DIAGNOSIS — T486X5A Adverse effect of antiasthmatics, initial encounter: Secondary | ICD-10-CM | POA: Diagnosis not present

## 2021-02-11 DIAGNOSIS — G834 Cauda equina syndrome: Secondary | ICD-10-CM | POA: Diagnosis not present

## 2021-02-11 DIAGNOSIS — N179 Acute kidney failure, unspecified: Secondary | ICD-10-CM | POA: Diagnosis present

## 2021-02-11 DIAGNOSIS — E876 Hypokalemia: Secondary | ICD-10-CM | POA: Diagnosis not present

## 2021-02-11 DIAGNOSIS — R059 Cough, unspecified: Secondary | ICD-10-CM | POA: Diagnosis not present

## 2021-02-11 DIAGNOSIS — Z7982 Long term (current) use of aspirin: Secondary | ICD-10-CM | POA: Diagnosis not present

## 2021-02-13 ENCOUNTER — Emergency Department (HOSPITAL_COMMUNITY): Payer: PPO

## 2021-02-13 ENCOUNTER — Encounter (HOSPITAL_COMMUNITY): Payer: Self-pay

## 2021-02-13 ENCOUNTER — Other Ambulatory Visit: Payer: Self-pay

## 2021-02-13 ENCOUNTER — Inpatient Hospital Stay (HOSPITAL_COMMUNITY)
Admission: EM | Admit: 2021-02-13 | Discharge: 2021-02-17 | DRG: 445 | Disposition: A | Discharge status: Home / Self Care | Payer: PPO | Attending: Internal Medicine | Admitting: Internal Medicine

## 2021-02-13 DIAGNOSIS — E785 Hyperlipidemia, unspecified: Secondary | ICD-10-CM | POA: Diagnosis present

## 2021-02-13 DIAGNOSIS — Z79899 Other long term (current) drug therapy: Secondary | ICD-10-CM

## 2021-02-13 DIAGNOSIS — T486X5A Adverse effect of antiasthmatics, initial encounter: Secondary | ICD-10-CM | POA: Diagnosis not present

## 2021-02-13 DIAGNOSIS — I639 Cerebral infarction, unspecified: Secondary | ICD-10-CM | POA: Diagnosis not present

## 2021-02-13 DIAGNOSIS — M5416 Radiculopathy, lumbar region: Secondary | ICD-10-CM | POA: Diagnosis present

## 2021-02-13 DIAGNOSIS — Z5309 Procedure and treatment not carried out because of other contraindication: Secondary | ICD-10-CM | POA: Diagnosis not present

## 2021-02-13 DIAGNOSIS — I10 Essential (primary) hypertension: Secondary | ICD-10-CM | POA: Diagnosis present

## 2021-02-13 DIAGNOSIS — I69331 Monoplegia of upper limb following cerebral infarction affecting right dominant side: Secondary | ICD-10-CM

## 2021-02-13 DIAGNOSIS — G8929 Other chronic pain: Secondary | ICD-10-CM | POA: Diagnosis present

## 2021-02-13 DIAGNOSIS — N179 Acute kidney failure, unspecified: Secondary | ICD-10-CM | POA: Diagnosis present

## 2021-02-13 DIAGNOSIS — N401 Enlarged prostate with lower urinary tract symptoms: Secondary | ICD-10-CM | POA: Diagnosis present

## 2021-02-13 DIAGNOSIS — E860 Dehydration: Secondary | ICD-10-CM | POA: Diagnosis present

## 2021-02-13 DIAGNOSIS — K810 Acute cholecystitis: Secondary | ICD-10-CM

## 2021-02-13 DIAGNOSIS — R5381 Other malaise: Secondary | ICD-10-CM | POA: Diagnosis present

## 2021-02-13 DIAGNOSIS — G834 Cauda equina syndrome: Secondary | ICD-10-CM

## 2021-02-13 DIAGNOSIS — E871 Hypo-osmolality and hyponatremia: Secondary | ICD-10-CM | POA: Diagnosis present

## 2021-02-13 DIAGNOSIS — Z7982 Long term (current) use of aspirin: Secondary | ICD-10-CM | POA: Diagnosis not present

## 2021-02-13 DIAGNOSIS — I472 Ventricular tachycardia, unspecified: Secondary | ICD-10-CM | POA: Diagnosis present

## 2021-02-13 DIAGNOSIS — Z85828 Personal history of other malignant neoplasm of skin: Secondary | ICD-10-CM | POA: Diagnosis not present

## 2021-02-13 DIAGNOSIS — R008 Other abnormalities of heart beat: Secondary | ICD-10-CM | POA: Diagnosis not present

## 2021-02-13 DIAGNOSIS — E7800 Pure hypercholesterolemia, unspecified: Secondary | ICD-10-CM | POA: Diagnosis not present

## 2021-02-13 DIAGNOSIS — K8000 Calculus of gallbladder with acute cholecystitis without obstruction: Secondary | ICD-10-CM | POA: Diagnosis present

## 2021-02-13 DIAGNOSIS — R9431 Abnormal electrocardiogram [ECG] [EKG]: Secondary | ICD-10-CM | POA: Diagnosis not present

## 2021-02-13 DIAGNOSIS — Z823 Family history of stroke: Secondary | ICD-10-CM | POA: Diagnosis not present

## 2021-02-13 DIAGNOSIS — R338 Other retention of urine: Secondary | ICD-10-CM | POA: Diagnosis not present

## 2021-02-13 DIAGNOSIS — E876 Hypokalemia: Secondary | ICD-10-CM | POA: Diagnosis not present

## 2021-02-13 DIAGNOSIS — R0602 Shortness of breath: Secondary | ICD-10-CM

## 2021-02-13 LAB — COMPREHENSIVE METABOLIC PANEL
ALT: 15 U/L (ref 0–44)
AST: 18 U/L (ref 15–41)
Albumin: 3.4 g/dL — ABNORMAL LOW (ref 3.5–5.0)
Alkaline Phosphatase: 72 U/L (ref 38–126)
Anion gap: 12 (ref 5–15)
BUN: 27 mg/dL — ABNORMAL HIGH (ref 8–23)
CO2: 25 mmol/L (ref 22–32)
Calcium: 8.5 mg/dL — ABNORMAL LOW (ref 8.9–10.3)
Chloride: 92 mmol/L — ABNORMAL LOW (ref 98–111)
Creatinine, Ser: 1.75 mg/dL — ABNORMAL HIGH (ref 0.61–1.24)
GFR, Estimated: 39 mL/min — ABNORMAL LOW (ref >60)
Glucose, Bld: 117 mg/dL — ABNORMAL HIGH (ref 70–99)
Potassium: 3.5 mmol/L (ref 3.5–5.1)
Sodium: 129 mmol/L — ABNORMAL LOW (ref 135–145)
Total Bilirubin: 1.3 mg/dL — ABNORMAL HIGH (ref 0.3–1.2)
Total Protein: 6.5 g/dL (ref 6.5–8.1)

## 2021-02-13 LAB — CBC WITH DIFFERENTIAL/PLATELET
Abs Immature Granulocytes: 0.25 10*3/uL — ABNORMAL HIGH (ref 0.00–0.07)
Basophils Absolute: 0.1 10*3/uL (ref 0.0–0.1)
Basophils Relative: 0 %
Eosinophils Absolute: 0 10*3/uL (ref 0.0–0.5)
Eosinophils Relative: 0 %
HCT: 43.1 % (ref 39.0–52.0)
Hemoglobin: 14.6 g/dL (ref 13.0–17.0)
Immature Granulocytes: 1 %
Lymphocytes Relative: 6 %
Lymphs Abs: 1.2 10*3/uL (ref 0.7–4.0)
MCH: 30.7 pg (ref 26.0–34.0)
MCHC: 33.9 g/dL (ref 30.0–36.0)
MCV: 90.7 fL (ref 80.0–100.0)
Monocytes Absolute: 1.2 10*3/uL — ABNORMAL HIGH (ref 0.1–1.0)
Monocytes Relative: 6 %
Neutro Abs: 18.5 10*3/uL — ABNORMAL HIGH (ref 1.7–7.7)
Neutrophils Relative %: 87 %
Platelets: 144 10*3/uL — ABNORMAL LOW (ref 150–400)
RBC: 4.75 MIL/uL (ref 4.22–5.81)
RDW: 13.4 % (ref 11.5–15.5)
WBC: 21.3 10*3/uL — ABNORMAL HIGH (ref 4.0–10.5)
nRBC: 0 % (ref 0.0–0.2)

## 2021-02-13 LAB — URINALYSIS, ROUTINE W REFLEX MICROSCOPIC
Bilirubin Urine: NEGATIVE
Glucose, UA: NEGATIVE mg/dL
Hgb urine dipstick: NEGATIVE
Ketones, ur: NEGATIVE mg/dL
Leukocytes,Ua: NEGATIVE
Nitrite: NEGATIVE
Protein, ur: NEGATIVE mg/dL
Specific Gravity, Urine: 1.015 (ref 1.005–1.030)
pH: 6 (ref 5.0–8.0)

## 2021-02-13 LAB — LACTIC ACID, PLASMA: Lactic Acid, Venous: 1.9 mmol/L (ref 0.5–1.9)

## 2021-02-13 LAB — LIPASE, BLOOD: Lipase: 28 U/L (ref 11–51)

## 2021-02-13 MED ORDER — ONDANSETRON HCL 4 MG/2ML IJ SOLN
4.0000 mg | Freq: Once | INTRAMUSCULAR | Status: AC
  Administered 2021-02-13: 4 mg via INTRAVENOUS
  Filled 2021-02-13: qty 2

## 2021-02-13 MED ORDER — SODIUM CHLORIDE 0.9 % IV BOLUS
500.0000 mL | Freq: Once | INTRAVENOUS | Status: AC
  Administered 2021-02-13: 500 mL via INTRAVENOUS

## 2021-02-13 MED ORDER — FENTANYL CITRATE PF 50 MCG/ML IJ SOSY
50.0000 ug | PREFILLED_SYRINGE | Freq: Once | INTRAMUSCULAR | Status: AC
  Administered 2021-02-13: 50 ug via INTRAVENOUS
  Filled 2021-02-13: qty 1

## 2021-02-13 MED ORDER — MORPHINE SULFATE (PF) 2 MG/ML IV SOLN
2.0000 mg | INTRAVENOUS | Status: DC | PRN
  Administered 2021-02-13 – 2021-02-14 (×4): 2 mg via INTRAVENOUS
  Filled 2021-02-13 (×4): qty 1

## 2021-02-13 MED ORDER — POTASSIUM CHLORIDE IN NACL 20-0.9 MEQ/L-% IV SOLN: INTRAVENOUS | Status: DC

## 2021-02-13 MED ORDER — IOHEXOL 300 MG/ML  SOLN
100.0000 mL | Freq: Once | INTRAMUSCULAR | Status: AC | PRN
  Administered 2021-02-13: 80 mL via INTRAVENOUS

## 2021-02-13 MED ORDER — PIPERACILLIN-TAZOBACTAM 3.375 G IVPB
3.3750 g | Freq: Three times a day (TID) | INTRAVENOUS | Status: DC
  Administered 2021-02-13 – 2021-02-17 (×12): 3.375 g via INTRAVENOUS
  Filled 2021-02-13 (×12): qty 50

## 2021-02-13 MED ORDER — HEPARIN SODIUM (PORCINE) 5000 UNIT/ML IJ SOLN
5000.0000 [IU] | Freq: Three times a day (TID) | INTRAMUSCULAR | Status: DC
  Administered 2021-02-13 – 2021-02-17 (×10): 5000 [IU] via SUBCUTANEOUS
  Filled 2021-02-13 (×11): qty 1

## 2021-02-13 NOTE — Plan of Care (Signed)

## 2021-02-13 NOTE — Progress Notes (Signed)
Pharmacy Antibiotic Note  Derrick Lawrence a 80 y.o. male admitted on 02/13/2021 with  unknown source of infection .  Pharmacy has been consulted for zosyn dosing.  Plan: Zosyn 3.375g IV q8h (4 hour infusion).  Medical History: Past Medical History:  Diagnosis Date   ED (erectile dysfunction)    H/O nonmelanoma skin cancer    Hyperlipidemia    Hypertension     Allergies:  No Known Allergies  Filed Weights   02/13/21 1106  Weight: 102.5 kg (226 lb)    CBC Latest Ref Rng & Units 02/13/2021 01/16/2019 01/14/2019  WBC 4.0 - 10.5 K/uL 21.3(H) 7.3 8.5  Hemoglobin 13.0 - 17.0 g/dL 14.6 14.9 14.3  Hematocrit 39.0 - 52.0 % 43.1 41.2 39.1  Platelets 150 - 400 K/uL 144(L) 219 197     Estimated Creatinine Clearance: 40.3 mL/min (A) (by C-G formula based on SCr of 1.75 mg/dL (H)).  Antibiotics Given (last 72 hours)     None       Antimicrobials this admission:  zosyn 02/13/2021  >>   Thank you for allowing pharmacy to be a part of this patient's care.  Thomasenia Sales, PharmD Clinical Pharmacist

## 2021-02-13 NOTE — ED Triage Notes (Addendum)
Pt presents for R side pain x3 day, RUQ, worse with deep breath. Pain caused him to fall while trying to get in bed, denies injury. Endorses nausea, vomiting x1, decreased intake, dizziness. Has taken percocet this AM.  Denies CP, urine changes, SOB (though appears dyspneic with exertion), blood in stool.  H/o CVA 2020 (R face deficits) Brought to exam room, PA at bedside

## 2021-02-13 NOTE — ED Provider Notes (Signed)
[facility] EMERGENCY DEPARTMENT Provider Note   CSN: [id number] Arrival date & time: 02/13/21  1053     History Chief Complaint  Patient presents with   Abdominal Pain    Derrick Lawrence is a 80 y.o. 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  He reports constant pain which has worsened over the past 48 hours.  He has had nausea with one episode of vomiting 2 nights ago.  He has had no recognized fevers or chills, denies dysuria, hematuria or changes in bowel habits.,  Last BM was yesterday.  He does note that he had an MRI of his lumbar spine secondary to his low back pain symptoms 2 days ago, his abdominal pain started after having this procedure.  The history is provided by the patient and a relative.      Past Medical History:  Diagnosis Date   ED (erectile dysfunction)    H/O nonmelanoma skin cancer    Hyperlipidemia    Hypertension     Patient Active Problem List   Diagnosis Date Noted   Acute CVA (cerebrovascular accident) ([place]) 01/14/2019   CVA (cerebral vascular accident) ([place]) 01/14/2019   Hypokalemia    Hyponatremia    HTN (hypertension) 07/03/2010   ED (erectile dysfunction) 07/03/2010   Hyperlipidemia 07/03/2010   Allergic rhinitis 07/03/2010   Diverticula of colon 07/03/2010   Hemorrhoids, internal, without mention of complications 70/62/3762    Past Surgical History:  Procedure Laterality Date   CATARACT EXTRACTION Right    HERNIA REPAIR     Abdominal   SKIN CANCER EXCISION         Family History  Problem Relation Age of Onset   Cancer - Lung Mother    Stroke Son     Social History   Tobacco Use   Smoking status: Never   Smokeless tobacco: Never  Substance Use Topics   Alcohol use: No   Drug use: No    Home Medications Prior to  Admission medications   Medication Sig Start Date End Date Taking? Authorizing Provider  amLODipine (NORVASC) 5 MG tablet Take 1 tablet by mouth once daily for blood pressure control 05/06/19   [provider]  aspirin 325 MG tablet Take 1 tablet (325 mg total) by mouth daily. 01/16/19   Danford, Suann Larry, MD  atorvastatin (LIPITOR) 40 MG tablet Take 1 tablet (40 mg total) by mouth daily. 01/16/19 01/16/20  Danford, Suann Larry, MD  Cyanocobalamin (B-12) 2500 MCG TABS Take by mouth.    [provider]  doxazosin (CARDURA) 8 MG tablet Take 1 tablet (8 mg total) by mouth at bedtime. Patient taking differently: Take 8 mg by mouth every morning.  10/26/12   Lysbeth Penner, FNP  fluticasone (FLONASE) 50 MCG/ACT nasal spray Place 2 sprays into the nose daily. Patient taking differently: Place 2 sprays into both nostrils daily.  10/12/12   Lysbeth Penner, FNP  hydrochlorothiazide (HYDRODIURIL) 25 MG tablet Take 1 tablet (25 mg total) by mouth daily. Patient taking differently: Take 25 mg by mouth every morning.  10/12/12   Lysbeth Penner, FNP  metoprolol succinate (TOPROL-XL) 100 MG 24 hr tablet Take 100 mg by mouth daily. 10/19/18   [provider]  quinapril (ACCUPRIL) 40 MG tablet Take 1 tablet (40 mg total) by mouth at bedtime. Patient taking  differently: Take 40 mg by mouth every morning.  10/12/12   Lysbeth Penner, FNP  triamcinolone lotion (KENALOG) 0.1 % 1 application topical Select Frequency 03/01/19   [provider]  valACYclovir (VALTREX) 1000 MG tablet Take 1,000 mg by mouth See admin instructions. Take two tablets (2000 mg) by mouth at onset of cold sore, may take two tablets (2000 mg) 12 hours later if still needed.    [provider]    Allergies    Patient has no known allergies.  Review of Systems   Review of Systems  Constitutional:  Negative for chills and fever.  HENT:  Negative for congestion and sore throat.   Eyes:  Negative.   Respiratory:  Negative for chest tightness and shortness of breath.   Cardiovascular:  Negative for chest pain.  Gastrointestinal:  Positive for abdominal pain, nausea and vomiting.  Genitourinary: Negative.   Musculoskeletal:  Positive for back pain. Negative for arthralgias, joint swelling and neck pain.  Skin: Negative.  Negative for rash and wound.  Neurological:  Negative for dizziness, weakness, light-headedness, numbness and headaches.  Psychiatric/Behavioral: Negative.     Physical Exam Updated Vital Signs BP 117/67   Pulse 67   Temp 98.1 F (36.7 C) (Oral)   Resp 17   Ht 5\' 8"  (1.727 m)   Wt 102.5 kg   SpO2 93%   BMI 34.36 kg/m   Physical Exam Vitals and nursing note reviewed.  Constitutional:      Appearance: He is well-developed.  HENT:     Head: Normocephalic and atraumatic.  Eyes:     Conjunctiva/sclera: Conjunctivae normal.  Cardiovascular:     Rate and Rhythm: Normal rate and regular rhythm.     Heart sounds: Normal heart sounds.  Pulmonary:     Effort: Pulmonary effort is normal.     Breath sounds: Normal breath sounds. No wheezing.  Abdominal:     General: Bowel sounds are normal.     Palpations: Abdomen is soft.     Tenderness: There is no abdominal tenderness.  Musculoskeletal:        General: Normal range of motion.     Cervical back: Normal range of motion.  Skin:    General: Skin is warm and dry.  Neurological:     Mental Status: He is alert.    ED Results / Procedures / Treatments   Labs (all labs ordered are listed, but only abnormal results are displayed) Labs Reviewed  CBC WITH DIFFERENTIAL/PLATELET - Abnormal; Notable for the following components:      Result Value   WBC 21.3 (*)    Platelets 144 (*)    Neutro Abs 18.5 (*)    Monocytes Absolute 1.2 (*)    Abs Immature Granulocytes 0.25 (*)    All other components within normal limits  COMPREHENSIVE METABOLIC PANEL - Abnormal; Notable for the following components:    Sodium 129 (*)    Chloride 92 (*)    Glucose, Bld 117 (*)    BUN 27 (*)    Creatinine, Ser 1.75 (*)    Calcium 8.5 (*)    Albumin 3.4 (*)    Total Bilirubin 1.3 (*)    GFR, Estimated 39 (*)    All other components within normal limits  LIPASE, BLOOD  LACTIC ACID, PLASMA  URINALYSIS, ROUTINE W REFLEX MICROSCOPIC    EKG None  Radiology CT Abdomen Pelvis W Contrast  Result Date: 02/13/2021 CLINICAL DATA:  Abdominal pain EXAM: CT ABDOMEN AND  PELVIS WITH CONTRAST TECHNIQUE: Multidetector CT imaging of the abdomen and pelvis was performed using the standard protocol following bolus administration of intravenous contrast. CONTRAST:  98mL OMNIPAQUE IOHEXOL 300 MG/ML  SOLN COMPARISON:  None. FINDINGS: Lower chest: No acute abnormality. Hepatobiliary: No suspicious liver lesions. Dilated gallbladder with surrounding inflammatory change and gallstones. No biliary ductal dilation. Pancreas: Unremarkable. No pancreatic ductal dilatation or surrounding inflammatory changes. Spleen: Normal in size without focal abnormality. Adrenals/Urinary Tract: Adrenal glands are unremarkable. No hydronephrosis or nephrolithiasis. Minimally complex cyst of the lower pole of the right kidney (Bosniak II). Simple cyst of the mid region of the left kidney. Stomach/Bowel: Stomach is within normal limits. Appendix is normal. No evidence of bowel wall thickening, distention, or inflammatory changes. CT Vascular/Lymphatic: Aortic atherosclerosis. No enlarged abdominal or pelvic lymph nodes. Reproductive: Mild prostatomegaly. Other: No abdominal wall hernia or abnormality. No abdominopelvic ascites. Musculoskeletal: No acute or significant osseous findings. IMPRESSION: 1. Dilated gallbladder with surrounding inflammatory change and gallstones, findings are compatible with acute cholecystitis. 2.  Aortic Atherosclerosis (ICD10-I70.0). Electronically Signed   By: Yetta Glassman M.D.   On: 02/13/2021 13:19     Procedures Procedures   Medications Ordered in ED Medications  morphine 2 MG/ML injection 2 mg (has no administration in time range)  fentaNYL (SUBLIMAZE) injection 50 mcg (50 mcg Intravenous Given 02/13/21 1141)  ondansetron (ZOFRAN) injection 4 mg (4 mg Intravenous Given 02/13/21 1141)  sodium chloride 0.9 % bolus 500 mL (0 mLs Intravenous Stopped 02/13/21 1319)  iohexol (OMNIPAQUE) 300 MG/ML solution 100 mL (80 mLs Intravenous Contrast Given 02/13/21 1308)  fentaNYL (SUBLIMAZE) injection 50 mcg (50 mcg Intravenous Given 02/13/21 1510)    ED Course  I have reviewed the triage vital signs and the nursing notes.  Pertinent labs & imaging results that were available during my care of the patient were reviewed by me and considered in my medical decision making (see chart for details).    MDM Rules/Calculators/A&P                         Under go cholecystectomy.  Labs and imaging reviewed and discussed with patient and family member at bedside.  He does have acute cholecystitis, he has normal LFTs and lipase suggesting he does not have acute cholangitis.  He does have a significant elevated WBC count however at 21,000.  He was given IV fluids, fentanyl and Zofran and had some pain relief with this treatment.  Call placed to Dr. Arnoldo Morale of general surgery.  Discussed patient with Dr. Arnoldo Morale who agrees patient should be admitted for cholecystectomy, however we also discussed his MRI findings of his lumbar spine which was completed 2 days ago and concerning for possible cauda equina I syndrome.  Review of the chart indicating patient was seen by Dr. Rayann Heman in his primary care office on October 26 at which time patient had been having some weakness and numbness radiating into his right leg after about 30 minutes of standing and ambulation, hence the lumbar MRI.  Patient states these symptoms have been stable but not improved despite steroid injections.  He has no urinary or fecal incontinence  or retention.   However, upon questioning again, he does state he has had a small amount of stool "leakage" for the past week.   Dr. Arnoldo Morale would like the hospitalist to admit this patient who may need to be considered for transfer down to [facility] as we have no neurosurgical service available here.  Patient's presentation, MRI and current symptoms discussed with Dr. Glenford Peers of neurosurgery who also reviewed the chart and his MRI and feels patient can follow-up for these MRI findings as a close outpatient follow-up assuming his symptoms remain stable.  That being said, patient can remain here for the gallbladder findings.  Discussed this with Dr. Waldron Labs who will admit the patient.  Dr. Arnoldo Morale was advised that patient is going to remain here, he will consult the patient in the morning, we will plan for surgery probably on Friday would like patient to have clear liquid diet until tomorrow.    Final Clinical Impression(s) / ED Diagnoses Final diagnoses:  Acute cholecystitis  Cauda equina compression [facility])    Rx / DC Orders ED Discharge Orders     None        Landis Martins 02/13/21 [place], [place], DO 02/14/21 [phone]

## 2021-02-13 NOTE — H&P (Signed)
TRH H&P   Patient Demographics:    Derrick Lawrence, is a 80 y.o. male  MRN: [id number]   DOB - Aug 26, 1940  Admit Date - 02/13/2021  Outpatient Primary MD for the patient is Christain Sacramento, MD  Referring MD/NP/PA: PA idol  Outpatient Specialists: neurosurgery Dr Marcello Moores    Patient coming from: home  Chief Complaint  Patient presents with   Abdominal Pain      HPI:    Derrick Lawrence  is a 80 y.o. male, medical history of hypertension, hyperlipidemia, CVA with residual right hand weakness, diverticulitis, chronic lower back pain with radiculopathy, with recent MRI as an outpatient showing nerve root compression. -Patient presents to ED secondary to complaints of aminal pain, nausea, vomiting, he denies any fever, chills, cough, dysuria or change in body habitus.  Chronic back pain at baseline, he denies any new focal deficits, tingling, numbness, saddle paresthesia or urinary retention. - in ED patient was found to have acute cholecystitis on CT abdomen and pelvis, sodium of 129, creatinine of 1.7, white blood cell of 21, no evidence of gallstone and bile duct, ED discussed with general surgery, who recommended admission, possible surgery on Friday.    Review of systems:    In addition to the HPI above,  No Fever-chills, No Headache, No changes with Vision or hearing, No problems swallowing food or Liquids, No Chest pain, Cough or Shortness of Breath, Plaints of abdominal pain, nausea and vomiting No Blood in stool or Urine, No dysuria, No new skin rashes or bruises, No new joints pains-aches,  No new weakness, tingling, numbness in any extremity, No recent weight gain or loss, No polyuria, polydypsia or polyphagia, No significant Mental Stressors.  A full 10 point Review of Systems was done, except as stated above, all other Review of Systems were negative.   With  Past History of the following :    Past Medical History:  Diagnosis Date   ED (erectile dysfunction)    H/O nonmelanoma skin cancer    Hyperlipidemia    Hypertension       Past Surgical History:  Procedure Laterality Date   CATARACT EXTRACTION Right    HERNIA REPAIR     Abdominal   SKIN CANCER EXCISION        Social History:     Social History   Tobacco Use   Smoking status: Never   Smokeless tobacco: Never  Substance Use Topics   Alcohol use: No       Family History :     Family History  Problem Relation Age of Onset   Cancer - Lung Mother    Stroke Son       Home Medications:   Prior to Admission medications   Medication Sig Start Date End Date Taking? Authorizing Provider  amLODipine (NORVASC) 5 MG tablet Take 1 tablet by mouth once daily for blood pressure  control 05/06/19   [provider]  aspirin 325 MG tablet Take 1 tablet (325 mg total) by mouth daily. 01/16/19   Danford, Suann Larry, MD  atorvastatin (LIPITOR) 40 MG tablet Take 1 tablet (40 mg total) by mouth daily. 01/16/19 01/16/20  Danford, Suann Larry, MD  Cyanocobalamin (B-12) 2500 MCG TABS Take by mouth.    [provider]  doxazosin (CARDURA) 8 MG tablet Take 1 tablet (8 mg total) by mouth at bedtime. Patient taking differently: Take 8 mg by mouth every morning.  10/26/12   Lysbeth Penner, FNP  fluticasone (FLONASE) 50 MCG/ACT nasal spray Place 2 sprays into the nose daily. Patient taking differently: Place 2 sprays into both nostrils daily.  10/12/12   Lysbeth Penner, FNP  hydrochlorothiazide (HYDRODIURIL) 25 MG tablet Take 1 tablet (25 mg total) by mouth daily. Patient taking differently: Take 25 mg by mouth every morning.  10/12/12   Lysbeth Penner, FNP  metoprolol succinate (TOPROL-XL) 100 MG 24 hr tablet Take 100 mg by mouth daily. 10/19/18   [provider]  quinapril (ACCUPRIL) 40 MG tablet Take 1 tablet (40 mg total) by mouth at bedtime. Patient  taking differently: Take 40 mg by mouth every morning.  10/12/12   Lysbeth Penner, FNP  triamcinolone lotion (KENALOG) 0.1 % 1 application topical Select Frequency 03/01/19   [provider]  valACYclovir (VALTREX) 1000 MG tablet Take 1,000 mg by mouth See admin instructions. Take two tablets (2000 mg) by mouth at onset of cold sore, may take two tablets (2000 mg) 12 hours later if still needed.    [provider]     Allergies:    No Known Allergies   Physical Exam:   Vitals  Blood pressure 117/67, pulse 67, temperature 98.1 F (36.7 C), temperature source Oral, resp. rate 17, height 5\' 8"  (1.727 m), weight 102.5 kg, SpO2 93 %.   1. General frail elderly male, laying in bed in no apparent distress.  2. Normal affect and insight, Not Suicidal or Homicidal, Awake Alert, Oriented X 3.  3. No F.N deficits, ALL C.Nerves Intact, Strength 5/5 all 4 extremities, Sensation intact all 4 extremities, Plantars down going.  4. Ears and Eyes appear Normal, Conjunctivae clear, PERRLA. Moist Oral Mucosa.  5. Supple Neck, No JVD, No cervical lymphadenopathy appriciated, No Carotid Bruits.  6. Symmetrical Chest wall movement, Good air movement bilaterally, CTAB.  7. RRR, No Gallops, Rubs or Murmurs, No Parasternal Heave.  8. Positive Bowel Sounds, Abdomen Soft, right upper quadrant tenderness to palpation , No organomegaly appriciated,No rebound -guarding or rigidity.  9.  No Cyanosis, Normal Skin Turgor, No Skin Rash or Bruise.  10. Good muscle tone,  joints appear normal , no effusions, Normal ROM.  11. No Palpable Lymph Nodes in Neck or Axillae     Data Review:    CBC Recent Labs  Lab 02/13/21 1120  WBC 21.3*  HGB 14.6  HCT 43.1  PLT 144*  MCV 90.7  MCH 30.7  MCHC 33.9  RDW 13.4  LYMPHSABS 1.2  MONOABS 1.2*  EOSABS 0.0  BASOSABS 0.1    ------------------------------------------------------------------------------------------------------------------  Chemistries  Recent Labs  Lab 02/13/21 1120  NA 129*  K 3.5  CL 92*  CO2 25  GLUCOSE 117*  BUN 27*  CREATININE 1.75*  CALCIUM 8.5*  AST 18  ALT 15  ALKPHOS 72  BILITOT 1.3*   ------------------------------------------------------------------------------------------------------------------ estimated creatinine clearance is 40.3 mL/min (A) (by C-G formula based on SCr  of 1.75 mg/dL (H)). ------------------------------------------------------------------------------------------------------------------ No results for input(s): TSH, T4TOTAL, T3FREE, THYROIDAB in the last 72 hours.  Invalid input(s): FREET3  Coagulation profile No results for input(s): INR, PROTIME in the last 168 hours. ------------------------------------------------------------------------------------------------------------------- No results for input(s): DDIMER in the last 72 hours. -------------------------------------------------------------------------------------------------------------------  Cardiac Enzymes No results for input(s): CKMB, TROPONINI, MYOGLOBIN in the last 168 hours.  Invalid input(s): CK ------------------------------------------------------------------------------------------------------------------ No results found for: BNP   ---------------------------------------------------------------------------------------------------------------  Urinalysis No results found for: COLORURINE, APPEARANCEUR, LABSPEC, PHURINE, GLUCOSEU, HGBUR, BILIRUBINUR, KETONESUR, PROTEINUR, UROBILINOGEN, NITRITE, LEUKOCYTESUR  ----------------------------------------------------------------------------------------------------------------   Imaging Results:    CT Abdomen Pelvis W Contrast  Result Date: 02/13/2021 CLINICAL DATA:  Abdominal pain EXAM: CT ABDOMEN AND PELVIS WITH CONTRAST  TECHNIQUE: Multidetector CT imaging of the abdomen and pelvis was performed using the standard protocol following bolus administration of intravenous contrast. CONTRAST:  38mL OMNIPAQUE IOHEXOL 300 MG/ML  SOLN COMPARISON:  None. FINDINGS: Lower chest: No acute abnormality. Hepatobiliary: No suspicious liver lesions. Dilated gallbladder with surrounding inflammatory change and gallstones. No biliary ductal dilation. Pancreas: Unremarkable. No pancreatic ductal dilatation or surrounding inflammatory changes. Spleen: Normal in size without focal abnormality. Adrenals/Urinary Tract: Adrenal glands are unremarkable. No hydronephrosis or nephrolithiasis. Minimally complex cyst of the lower pole of the right kidney (Bosniak II). Simple cyst of the mid region of the left kidney. Stomach/Bowel: Stomach is within normal limits. Appendix is normal. No evidence of bowel wall thickening, distention, or inflammatory changes. CT Vascular/Lymphatic: Aortic atherosclerosis. No enlarged abdominal or pelvic lymph nodes. Reproductive: Mild prostatomegaly. Other: No abdominal wall hernia or abnormality. No abdominopelvic ascites. Musculoskeletal: No acute or significant osseous findings. IMPRESSION: 1. Dilated gallbladder with surrounding inflammatory change and gallstones, findings are compatible with acute cholecystitis. 2.  Aortic Atherosclerosis (ICD10-I70.0). Electronically Signed   By: Yetta Glassman M.D.   On: 02/13/2021 13:19      Assessment & Plan:    Active Problems:   HTN (hypertension)   Hyperlipidemia   Hyponatremia   CVA (cerebral vascular accident) ([place])  acute cholecystitis -Patient presents with quadrant abdominal pain, nausea, vomiting, leukocytosis lactic acid within normal limits -CT abdomen and pelvis significant for acute cholecystitis, evidence of bile duct dilation, total bilirubin borderline at 1.3. -We will start on IV Zosyn -Follow on blood cultures cultures. -General surgery consulted by ED  and patient well  need cholecystectomy, likely on Friday, meanwhile keep on clear liquid diet.   Hyponatremia -Depletion and dehydration, continue with IV normal saline.  Evidence of nerve compression on MRI done as an outpatient 11/21 -Given lack of any symptoms, recommendation for outpatient follow-up I discussed with Dr. Reatha Armour, patient already following with Dr. Marcello Moores with neurosurgery as an outpatient.  -AKI  -Prerenal, continue with IV fluids and avoid nephrotoxic medications  Attention -Due to acute infection and soft blood pressure, hold home medications for now  History of CVA -Continue with statin, aspirin for now for need of surgery  Hyperlipidemia -Resume statin once stable   DVT Prophylaxis Heparin  AM Labs Ordered, also please review Full Orders  Family Communication: Admission, patients condition and plan of care including tests being ordered have been discussed with the patient and son in law at [place] indicate understanding and agree with the plan and Code Status.  Code Status Full  Likely DC to  home  Condition GUARDED    Consults called: general surgery    Admission status: inpatient    Time spent in minutes : 60 minutes   Phillips Climes M.D on  02/13/2021 at 4:22 PM   [facility] - Office  [phone]

## 2021-02-14 ENCOUNTER — Inpatient Hospital Stay (HOSPITAL_COMMUNITY): Payer: PPO

## 2021-02-14 LAB — COMPREHENSIVE METABOLIC PANEL
ALT: 15 U/L (ref 0–44)
AST: 17 U/L (ref 15–41)
Albumin: 2.8 g/dL — ABNORMAL LOW (ref 3.5–5.0)
Alkaline Phosphatase: 62 U/L (ref 38–126)
Anion gap: 8 (ref 5–15)
BUN: 42 mg/dL — ABNORMAL HIGH (ref 8–23)
CO2: 25 mmol/L (ref 22–32)
Calcium: 7.7 mg/dL — ABNORMAL LOW (ref 8.9–10.3)
Chloride: 94 mmol/L — ABNORMAL LOW (ref 98–111)
Creatinine, Ser: 2.08 mg/dL — ABNORMAL HIGH (ref 0.61–1.24)
GFR, Estimated: 32 mL/min — ABNORMAL LOW (ref >60)
Glucose, Bld: 96 mg/dL (ref 70–99)
Potassium: 3.7 mmol/L (ref 3.5–5.1)
Sodium: 127 mmol/L — ABNORMAL LOW (ref 135–145)
Total Bilirubin: 1.4 mg/dL — ABNORMAL HIGH (ref 0.3–1.2)
Total Protein: 5.7 g/dL — ABNORMAL LOW (ref 6.5–8.1)

## 2021-02-14 LAB — CBC
HCT: 38.4 % — ABNORMAL LOW (ref 39.0–52.0)
Hemoglobin: 13.1 g/dL (ref 13.0–17.0)
MCH: 30.9 pg (ref 26.0–34.0)
MCHC: 34.1 g/dL (ref 30.0–36.0)
MCV: 90.6 fL (ref 80.0–100.0)
Platelets: 135 10*3/uL — ABNORMAL LOW (ref 150–400)
RBC: 4.24 MIL/uL (ref 4.22–5.81)
RDW: 13.7 % (ref 11.5–15.5)
WBC: 18.1 10*3/uL — ABNORMAL HIGH (ref 4.0–10.5)
nRBC: 0 % (ref 0.0–0.2)

## 2021-02-14 MED ORDER — ACETAMINOPHEN 325 MG PO TABS
650.0000 mg | ORAL_TABLET | Freq: Four times a day (QID) | ORAL | Status: DC | PRN
  Administered 2021-02-14 – 2021-02-16 (×3): 650 mg via ORAL
  Filled 2021-02-14 (×3): qty 2

## 2021-02-14 MED ORDER — ALBUTEROL SULFATE (2.5 MG/3ML) 0.083% IN NEBU
2.5000 mg | INHALATION_SOLUTION | RESPIRATORY_TRACT | Status: DC | PRN
  Administered 2021-02-14 – 2021-02-16 (×4): 2.5 mg via RESPIRATORY_TRACT
  Filled 2021-02-14 (×4): qty 3

## 2021-02-14 MED ORDER — MORPHINE SULFATE (PF) 4 MG/ML IV SOLN
4.0000 mg | INTRAVENOUS | Status: DC | PRN
  Administered 2021-02-14 – 2021-02-16 (×8): 4 mg via INTRAVENOUS
  Filled 2021-02-14 (×8): qty 1

## 2021-02-14 MED ORDER — SODIUM CHLORIDE 0.9 % IV BOLUS
2000.0000 mL | Freq: Once | INTRAVENOUS | Status: AC
  Administered 2021-02-14: 1000 mL via INTRAVENOUS

## 2021-02-14 NOTE — Consult Note (Signed)
Reason for Consult: Acute cholecystitis, cholelithiasis Referring Physician: Dr. Gillermina Hu is an 80 y.o. male.  HPI: Patient is a 80 year old white male who presented to the emergency room with worsening right upper quadrant abdominal pain, nausea, and discomfort.  He states that his came on suddenly.  He has never had an episode like this before.  A CT scan of the abdomen revealed acute cholecystitis with cholelithiasis.  Patient's history is significant for hypertension and a CVA with residual right hand weakness.  He recently also had an MRI for back pain.  He is followed by neurosurgery.  This was reviewed and neurosurgery says that this is a chronic condition that will be managed as an outpatient.  In the emergency room, his creatinine was 1.7, white blood cell count 21,000, LFTs remarkable for a slightly elevated total bilirubin 1.3.  He was admitted overnight and started on IV hydration and IV antibiotics.  This morning, he states he is still having right upper quadrant abdominal pain.  Past Medical History:  Diagnosis Date   ED (erectile dysfunction)    H/O nonmelanoma skin cancer    Hyperlipidemia    Hypertension     Past Surgical History:  Procedure Laterality Date   CATARACT EXTRACTION Right    HERNIA REPAIR     Abdominal   SKIN CANCER EXCISION      Family History  Problem Relation Age of Onset   Cancer - Lung Mother    Stroke Son     Social History:  reports that he has never smoked. He has never used smokeless tobacco. He reports that he does not drink alcohol and does not use drugs.  Allergies: No Known Allergies  Medications: I have reviewed the patient's current medications. Prior to Admission:  Medications Prior to Admission  Medication Sig Dispense Refill Last Dose   amLODipine (NORVASC) 5 MG tablet Take 1 tablet by mouth once daily for blood pressure control      aspirin 325 MG tablet Take 1 tablet (325 mg total) by mouth daily.       atorvastatin (LIPITOR) 40 MG tablet Take 1 tablet (40 mg total) by mouth daily. 30 tablet 11    Cyanocobalamin (B-12) 2500 MCG TABS Take by mouth.      doxazosin (CARDURA) 8 MG tablet Take 1 tablet (8 mg total) by mouth at bedtime. (Patient taking differently: Take 8 mg by mouth every morning. ) 90 tablet 3    fluticasone (FLONASE) 50 MCG/ACT nasal spray Place 2 sprays into the nose daily. (Patient taking differently: Place 2 sprays into both nostrils daily. ) 1 g 11    hydrochlorothiazide (HYDRODIURIL) 25 MG tablet Take 1 tablet (25 mg total) by mouth daily. (Patient taking differently: Take 25 mg by mouth every morning. ) 90 tablet 3    metoprolol succinate (TOPROL-XL) 100 MG 24 hr tablet Take 100 mg by mouth daily.      quinapril (ACCUPRIL) 40 MG tablet Take 1 tablet (40 mg total) by mouth at bedtime. (Patient taking differently: Take 40 mg by mouth every morning. ) 90 tablet 3    triamcinolone lotion (KENALOG) 0.1 % 1 application topical Select Frequency      valACYclovir (VALTREX) 1000 MG tablet Take 1,000 mg by mouth See admin instructions. Take two tablets (2000 mg) by mouth at onset of cold sore, may take two tablets (2000 mg) 12 hours later if still needed.       Results for orders placed or  performed during the hospital encounter of 02/13/21 (from the past 48 hour(s))  CBC with Differential     Status: Abnormal   Collection Time: 02/13/21 11:20 AM  Result Value Ref Range   WBC 21.3 (H) 4.0 - 10.5 K/uL   RBC 4.75 4.22 - 5.81 MIL/uL   Hemoglobin 14.6 13.0 - 17.0 g/dL   HCT 43.1 39.0 - 52.0 %   MCV 90.7 80.0 - 100.0 fL   MCH 30.7 26.0 - 34.0 pg   MCHC 33.9 30.0 - 36.0 g/dL   RDW 13.4 11.5 - 15.5 %   Platelets 144 (L) 150 - 400 K/uL   nRBC 0.0 0.0 - 0.2 %   Neutrophils Relative % 87 %   Neutro Abs 18.5 (H) 1.7 - 7.7 K/uL   Lymphocytes Relative 6 %   Lymphs Abs 1.2 0.7 - 4.0 K/uL   Monocytes Relative 6 %   Monocytes Absolute 1.2 (H) 0.1 - 1.0 K/uL   Eosinophils Relative 0 %    Eosinophils Absolute 0.0 0.0 - 0.5 K/uL   Basophils Relative 0 %   Basophils Absolute 0.1 0.0 - 0.1 K/uL   Immature Granulocytes 1 %   Abs Immature Granulocytes 0.25 (H) 0.00 - 0.07 K/uL    Comment: Performed at [facility], [address], [place], [place] [address]  Comprehensive metabolic panel     Status: Abnormal   Collection Time: 02/13/21 11:20 AM  Result Value Ref Range   Sodium 129 (L) 135 - 145 mmol/L   Potassium 3.5 3.5 - 5.1 mmol/L   Chloride 92 (L) 98 - 111 mmol/L   CO2 25 22 - 32 mmol/L   Glucose, Bld 117 (H) 70 - 99 mg/dL    Comment: Glucose reference range applies only to samples taken after fasting for at least 8 hours.   BUN 27 (H) 8 - 23 mg/dL   Creatinine, Ser 1.75 (H) 0.61 - 1.24 mg/dL   Calcium 8.5 (L) 8.9 - 10.3 mg/dL   Total Protein 6.5 6.5 - 8.1 g/dL   Albumin 3.4 (L) 3.5 - 5.0 g/dL   AST 18 15 - 41 U/L   ALT 15 0 - 44 U/L   Alkaline Phosphatase 72 38 - 126 U/L   Total Bilirubin 1.3 (H) 0.3 - 1.2 mg/dL   GFR, Estimated 39 (L) >60 mL/min    Comment: (NOTE) Calculated using the CKD-EPI Creatinine Equation (2021)    Anion gap 12 5 - 15    Comment: Performed at [facility], [address], [place], [place] [address]  Lipase, blood     Status: None   Collection Time: 02/13/21 11:20 AM  Result Value Ref Range   Lipase 28 11 - 51 U/L    Comment: Performed at [facility], [address] [place], [place] [address]  Lactic acid, plasma     Status: None   Collection Time: 02/13/21 12:23 PM  Result Value Ref Range   Lactic Acid, Venous 1.9 0.5 - 1.9 mmol/L    Comment: Performed at [facility], [address] [place], [place] [address]  Urinalysis, Routine w reflex microscopic     Status: None   Collection Time: 02/13/21  5:25 PM  Result Value Ref Range   Color, Urine YELLOW YELLOW   APPearance CLEAR CLEAR   Specific Gravity, Urine 1.015 1.005 - 1.030   pH 6.0 5.0 - 8.0   Glucose, UA NEGATIVE NEGATIVE mg/dL   Hgb urine dipstick NEGATIVE NEGATIVE    Bilirubin Urine NEGATIVE  NEGATIVE   Ketones, ur NEGATIVE NEGATIVE mg/dL   Protein, ur NEGATIVE NEGATIVE mg/dL   Nitrite NEGATIVE NEGATIVE   Leukocytes,Ua NEGATIVE NEGATIVE    Comment: Microscopic not done on urines with negative protein, blood, leukocytes, nitrite, or glucose < 500 mg/dL. Performed at [facility], [address], [place], [place] [address]   Comprehensive metabolic panel     Status: Abnormal   Collection Time: 02/14/21  6:20 AM  Result Value Ref Range   Sodium 127 (L) 135 - 145 mmol/L   Potassium 3.7 3.5 - 5.1 mmol/L   Chloride 94 (L) 98 - 111 mmol/L   CO2 25 22 - 32 mmol/L   Glucose, Bld 96 70 - 99 mg/dL    Comment: Glucose reference range applies only to samples taken after fasting for at least 8 hours.   BUN 42 (H) 8 - 23 mg/dL   Creatinine, Ser 2.08 (H) 0.61 - 1.24 mg/dL   Calcium 7.7 (L) 8.9 - 10.3 mg/dL   Total Protein 5.7 (L) 6.5 - 8.1 g/dL   Albumin 2.8 (L) 3.5 - 5.0 g/dL   AST 17 15 - 41 U/L   ALT 15 0 - 44 U/L   Alkaline Phosphatase 62 38 - 126 U/L   Total Bilirubin 1.4 (H) 0.3 - 1.2 mg/dL   GFR, Estimated 32 (L) >60 mL/min    Comment: (NOTE) Calculated using the CKD-EPI Creatinine Equation (2021)    Anion gap 8 5 - 15    Comment: Performed at [facility], [address] [place], [place] [address]  CBC     Status: Abnormal   Collection Time: 02/14/21  6:20 AM  Result Value Ref Range   WBC 18.1 (H) 4.0 - 10.5 K/uL   RBC 4.24 4.22 - 5.81 MIL/uL   Hemoglobin 13.1 13.0 - 17.0 g/dL   HCT 38.4 (L) 39.0 - 52.0 %   MCV 90.6 80.0 - 100.0 fL   MCH 30.9 26.0 - 34.0 pg   MCHC 34.1 30.0 - 36.0 g/dL   RDW 13.7 11.5 - 15.5 %   Platelets 135 (L) 150 - 400 K/uL   nRBC 0.0 0.0 - 0.2 %    Comment: Performed at [facility], [address], [place], [place] [address]    CT Abdomen Pelvis W Contrast  Result Date: 02/13/2021 CLINICAL DATA:  Abdominal pain EXAM: CT ABDOMEN AND PELVIS WITH CONTRAST TECHNIQUE: Multidetector CT imaging of the abdomen and pelvis was  performed using the standard protocol following bolus administration of intravenous contrast. CONTRAST:  35mL OMNIPAQUE IOHEXOL 300 MG/ML  SOLN COMPARISON:  None. FINDINGS: Lower chest: No acute abnormality. Hepatobiliary: No suspicious liver lesions. Dilated gallbladder with surrounding inflammatory change and gallstones. No biliary ductal dilation. Pancreas: Unremarkable. No pancreatic ductal dilatation or surrounding inflammatory changes. Spleen: Normal in size without focal abnormality. Adrenals/Urinary Tract: Adrenal glands are unremarkable. No hydronephrosis or nephrolithiasis. Minimally complex cyst of the lower pole of the right kidney (Bosniak II). Simple cyst of the mid region of the left kidney. Stomach/Bowel: Stomach is within normal limits. Appendix is normal. No evidence of bowel wall thickening, distention, or inflammatory changes. CT Vascular/Lymphatic: Aortic atherosclerosis. No enlarged abdominal or pelvic lymph nodes. Reproductive: Mild prostatomegaly. Other: No abdominal wall hernia or abnormality. No abdominopelvic ascites. Musculoskeletal: No acute or significant osseous findings. IMPRESSION: 1. Dilated gallbladder with surrounding inflammatory change and gallstones, findings are compatible with acute cholecystitis. 2.  Aortic Atherosclerosis (ICD10-I70.0). Electronically Signed   By: Hosie Poisson.D.  On: 02/13/2021 13:19    ROS:  Pertinent items are noted in HPI.  Blood pressure 134/70, pulse 88, temperature 98.5 F (36.9 C), temperature source Oral, resp. rate 19, height 5\' 8"  (1.727 m), weight 101.2 kg, SpO2 92 %. Physical Exam: Pleasant white male no acute distress Head is normocephalic, atraumatic Lungs clear to auscultation with mild upper respiratory wheezing noted. Heart examination reveals a regular rate and rhythm without S3, S4, murmurs Abdomen soft with tenderness on the right upper quadrant to palpation.  Patient has a rotund abdomen.  No rigidity is noted.  CT  scan and labs reviewed  Assessment/Plan: Impression: Acute cholecystitis with cholelithiasis.  Leukocytosis mildly improved.  He does have elevated BUN and creatinine.  I suspect that he has been under resuscitated.  This was discussed with Dr. Sloan Leiter.  This needs to be addressed prior to any surgery.  He will need a laparoscopic cholecystectomy prior to discharge.  The risks and benefits of the procedure including bleeding, infection, hepatobiliary injury, and the possibility of an open procedure were fully explained to the patient, who gave informed consent.  Aviva Signs 02/14/2021, 10:04 AM

## 2021-02-14 NOTE — Progress Notes (Signed)
PROGRESS NOTE    Derrick Lawrence Derrick Lawrence  KGU:[id number] DOB: 07-21-1940 DOA: 02/13/2021 PCP: Christain Sacramento, MD    Brief Narrative:  80 year old with history of hypertension, hyperlipidemia, stroke with right upper extremity hemiparesis and chronic low back pain with radiculopathy presented to the ER with 2 days of abdominal pain, nausea and vomiting.  He was found to have acute cholecystitis on CT scan of the abdomen pelvis along with acute kidney injury.  Admit to the hospital with surgical consultation.   Assessment & Plan:   Principal Problem:   Acute cholecystitis Active Problems:   HTN (hypertension)   Hyperlipidemia   Hyponatremia   CVA (cerebral vascular accident) ([place])  Acute calculus cholecystitis: IV fluids.  Clears.  Adequate pain medications with IV and oral opiates.  IV antibiotic with Zosyn.  Anticipate lap chole in next 24 to 48 hours as per surgery.  Cultures are pending.  Acute kidney injury/dehydration with hyponatremia: Hold all antihypertensive medications.  Continue isotonic fluid resuscitation.  Recheck kidney functions tomorrow morning.  Additional boluses today.  Lumbar radiculopathy, nerve root compression on MRI done as outpatient on 11/21. Discussed with neurosurgery.  Patient is planning to follow-up with Dr. Marcello Moores as outpatient.  History of stroke with right upper extremity hemiparesis: Stable.  No new neurological deficits.  Aspirin on hold.  Statin on hold until surgery.  Essential hypertension: Blood pressures normal to low normal.  Hold all antihypertensives.   DVT prophylaxis: heparin injection 5,000 Units Start: 02/13/21 2200 SCDs Start: 02/13/21 2036   Code Status: Full code Family Communication: Grandson at bedside Disposition Plan: Status is: Inpatient  Remains inpatient appropriate because: Ongoing need for IV antibiotics, IV fluids and inpatient surgical intervention.         Consultants:  General surgery  Procedures:   None  Antimicrobials:  Zosyn 11/23----   Subjective: Patient was seen and examined.  Grandson was at the bedside.  Overnight he had ongoing pain.  Increased dose of morphine and increased frequency helped control the pain.  Denies any nausea vomiting.  No appetite.  He was prescribed clear liquid diet but does not want to eat.  Objective: Vitals:   02/13/21 1725 02/13/21 1823 02/13/21 2010 02/14/21 0434  BP: (!) 103/59 108/61 113/70 134/70  Pulse: 76 73 77 88  Resp: 20 20 20 19   Temp:  98.5 F (36.9 C) 99.1 F (37.3 C) 98.5 F (36.9 C)  TempSrc:  Oral  Oral  SpO2: 95% 94% 92% 92%  Weight:  101.2 kg    Height:  5\' 8"  (1.727 m)      Intake/Output Summary (Last 24 hours) at 02/14/2021 1055 Last data filed at 02/13/2021 1319 Gross per 24 hour  Intake 500 ml  Output --  Net 500 ml   Filed Weights   02/13/21 1106 02/13/21 1823  Weight: 102.5 kg 101.2 kg    Examination:  General exam: Appears calm and comfortable  Patient is on room air.  Is slightly anxious. Respiratory system: Clear to auscultation. Respiratory effort normal. Cardiovascular system: S1 & S2 heard, RRR. No JVD, murmurs, rubs, gallops or clicks. No pedal edema. Gastrointestinal system: Soft.  Moderate tenderness right upper quadrant.  No rigidity or guarding.  Bowel sounds present. Central nervous system: Alert and oriented.  Anxious.    Data Reviewed: I have personally reviewed following labs and imaging studies  CBC: Recent Labs  Lab 02/13/21 1120 02/14/21 0620  WBC 21.3* 18.1*  NEUTROABS 18.5*  --   HGB 14.6  13.1  HCT 43.1 38.4*  MCV 90.7 90.6  PLT 144* 119*   Basic Metabolic Panel: Recent Labs  Lab 02/13/21 1120 02/14/21 0620  NA 129* 127*  K 3.5 3.7  CL 92* 94*  CO2 25 25  GLUCOSE 117* 96  BUN 27* 42*  CREATININE 1.75* 2.08*  CALCIUM 8.5* 7.7*   GFR: Estimated Creatinine Clearance: 33.7 mL/min (A) (by C-G formula based on SCr of 2.08 mg/dL (H)). Liver Function  Tests: Recent Labs  Lab 02/13/21 1120 02/14/21 0620  AST 18 17  ALT 15 15  ALKPHOS 72 62  BILITOT 1.3* 1.4*  PROT 6.5 5.7*  ALBUMIN 3.4* 2.8*   Recent Labs  Lab 02/13/21 1120  LIPASE 28   No results for input(s): AMMONIA in the last 168 hours. Coagulation Profile: No results for input(s): INR, PROTIME in the last 168 hours. Cardiac Enzymes: No results for input(s): CKTOTAL, CKMB, CKMBINDEX, TROPONINI in the last 168 hours. BNP (last 3 results) No results for input(s): PROBNP in the last 8760 hours. HbA1C: No results for input(s): HGBA1C in the last 72 hours. CBG: No results for input(s): GLUCAP in the last 168 hours. Lipid Profile: No results for input(s): CHOL, HDL, LDLCALC, TRIG, CHOLHDL, LDLDIRECT in the last 72 hours. Thyroid Function Tests: No results for input(s): TSH, T4TOTAL, FREET4, T3FREE, THYROIDAB in the last 72 hours. Anemia Panel: No results for input(s): VITAMINB12, FOLATE, FERRITIN, TIBC, IRON, RETICCTPCT in the last 72 hours. Sepsis Labs: Recent Labs  Lab 02/13/21 1223  LATICACIDVEN 1.9    No results found for this or any previous visit (from the past 240 hour(s)).       Radiology Studies: CT Abdomen Pelvis W Contrast  Result Date: 02/13/2021 CLINICAL DATA:  Abdominal pain EXAM: CT ABDOMEN AND PELVIS WITH CONTRAST TECHNIQUE: Multidetector CT imaging of the abdomen and pelvis was performed using the standard protocol following bolus administration of intravenous contrast. CONTRAST:  29mL OMNIPAQUE IOHEXOL 300 MG/ML  SOLN COMPARISON:  None. FINDINGS: Lower chest: No acute abnormality. Hepatobiliary: No suspicious liver lesions. Dilated gallbladder with surrounding inflammatory change and gallstones. No biliary ductal dilation. Pancreas: Unremarkable. No pancreatic ductal dilatation or surrounding inflammatory changes. Spleen: Normal in size without focal abnormality. Adrenals/Urinary Tract: Adrenal glands are unremarkable. No hydronephrosis or  nephrolithiasis. Minimally complex cyst of the lower pole of the right kidney (Bosniak II). Simple cyst of the mid region of the left kidney. Stomach/Bowel: Stomach is within normal limits. Appendix is normal. No evidence of bowel wall thickening, distention, or inflammatory changes. CT Vascular/Lymphatic: Aortic atherosclerosis. No enlarged abdominal or pelvic lymph nodes. Reproductive: Mild prostatomegaly. Other: No abdominal wall hernia or abnormality. No abdominopelvic ascites. Musculoskeletal: No acute or significant osseous findings. IMPRESSION: 1. Dilated gallbladder with surrounding inflammatory change and gallstones, findings are compatible with acute cholecystitis. 2.  Aortic Atherosclerosis (ICD10-I70.0). Electronically Signed   By: Yetta Glassman M.D.   On: 02/13/2021 13:19        Scheduled Meds:  heparin  5,000 Units Subcutaneous Q8H   Continuous Infusions:  0.9 % NaCl with KCl 20 mEq / L 50 mL/hr at 02/13/21 1844   piperacillin-tazobactam (ZOSYN)  IV 3.375 g (02/14/21 0515)     LOS: 1 day    Time spent: 32 minutes    Barb Merino, MD [facility] Pager [phone]

## 2021-02-14 NOTE — Progress Notes (Signed)
Patient requested to go the restroom. Patient was very weak, seemed to be struggling. Asked if he would like to use a walker. Stated his wife had one at home, but he'd never used one. Assisted patient with walker to the bathroom. Patient took quite a bit of time to make it that way. Once in the restroom, he was coughing and very wheezing, leaning over fussing at himself. Body was making a "c" shape and could not stand up. Stood behind him and called for assist. Placed patient in roller chair and returned him to bed with assistance of [place], NT. Patient was educated on use of condom cath. Notified Dr. Sloan Leiter of patient's increased work of breathing and decompensation, came to assess patient. New orders placed.

## 2021-02-14 NOTE — Progress Notes (Signed)
Patient bolus adjusted to 136ml/hr due to condition per Dr. Sloan Leiter.

## 2021-02-14 NOTE — Plan of Care (Signed)

## 2021-02-15 LAB — CBC WITH DIFFERENTIAL/PLATELET
Abs Immature Granulocytes: 0.11 10*3/uL — ABNORMAL HIGH (ref 0.00–0.07)
Basophils Absolute: 0 10*3/uL (ref 0.0–0.1)
Basophils Relative: 0 %
Eosinophils Absolute: 0.2 10*3/uL (ref 0.0–0.5)
Eosinophils Relative: 1 %
HCT: 38.4 % — ABNORMAL LOW (ref 39.0–52.0)
Hemoglobin: 12.8 g/dL — ABNORMAL LOW (ref 13.0–17.0)
Immature Granulocytes: 1 %
Lymphocytes Relative: 11 %
Lymphs Abs: 1.4 10*3/uL (ref 0.7–4.0)
MCH: 30 pg (ref 26.0–34.0)
MCHC: 33.3 g/dL (ref 30.0–36.0)
MCV: 90.1 fL (ref 80.0–100.0)
Monocytes Absolute: 0.6 10*3/uL (ref 0.1–1.0)
Monocytes Relative: 5 %
Neutro Abs: 10.5 10*3/uL — ABNORMAL HIGH (ref 1.7–7.7)
Neutrophils Relative %: 82 %
Platelets: 143 10*3/uL — ABNORMAL LOW (ref 150–400)
RBC: 4.26 MIL/uL (ref 4.22–5.81)
RDW: 13.6 % (ref 11.5–15.5)
WBC: 12.9 10*3/uL — ABNORMAL HIGH (ref 4.0–10.5)
nRBC: 0 % (ref 0.0–0.2)

## 2021-02-15 LAB — COMPREHENSIVE METABOLIC PANEL
ALT: 17 U/L (ref 0–44)
AST: 21 U/L (ref 15–41)
Albumin: 2.7 g/dL — ABNORMAL LOW (ref 3.5–5.0)
Alkaline Phosphatase: 67 U/L (ref 38–126)
Anion gap: 12 (ref 5–15)
BUN: 45 mg/dL — ABNORMAL HIGH (ref 8–23)
CO2: 21 mmol/L — ABNORMAL LOW (ref 22–32)
Calcium: 7.6 mg/dL — ABNORMAL LOW (ref 8.9–10.3)
Chloride: 97 mmol/L — ABNORMAL LOW (ref 98–111)
Creatinine, Ser: 1.73 mg/dL — ABNORMAL HIGH (ref 0.61–1.24)
GFR, Estimated: 40 mL/min — ABNORMAL LOW (ref >60)
Glucose, Bld: 110 mg/dL — ABNORMAL HIGH (ref 70–99)
Potassium: 3.2 mmol/L — ABNORMAL LOW (ref 3.5–5.1)
Sodium: 130 mmol/L — ABNORMAL LOW (ref 135–145)
Total Bilirubin: 1.3 mg/dL — ABNORMAL HIGH (ref 0.3–1.2)
Total Protein: 5.8 g/dL — ABNORMAL LOW (ref 6.5–8.1)

## 2021-02-15 LAB — SURGICAL PCR SCREEN
MRSA, PCR: NEGATIVE
Staphylococcus aureus: NEGATIVE

## 2021-02-15 LAB — MAGNESIUM: Magnesium: 1.7 mg/dL (ref 1.7–2.4)

## 2021-02-15 MED ORDER — POTASSIUM CHLORIDE 10 MEQ/100ML IV SOLN
10.0000 meq | INTRAVENOUS | Status: AC
  Administered 2021-02-15 (×3): 10 meq via INTRAVENOUS
  Filled 2021-02-15 (×3): qty 100

## 2021-02-15 MED ORDER — CHLORHEXIDINE GLUCONATE CLOTH 2 % EX PADS
6.0000 | MEDICATED_PAD | Freq: Once | CUTANEOUS | Status: AC
  Administered 2021-02-15: 6 via TOPICAL

## 2021-02-15 MED ORDER — MUPIROCIN 2 % EX OINT
1.0000 "application " | TOPICAL_OINTMENT | Freq: Two times a day (BID) | CUTANEOUS | Status: DC
  Administered 2021-02-15: 1 via NASAL
  Filled 2021-02-15: qty 22

## 2021-02-15 MED ORDER — HYDROCORTISONE 1 % EX CREA
TOPICAL_CREAM | Freq: Two times a day (BID) | CUTANEOUS | Status: DC
  Filled 2021-02-15: qty 28

## 2021-02-15 NOTE — Care Management Important Message (Signed)
Important Message  Patient Details  Name: Derrick Lawrence MRN: [id number] Date of Birth: 12/15/1940   Medicare Important Message Given:  Other (see comment)  Attempted to reach patient via room phone to review Medicare IM.  Unable to reach at this time.     Dannette Barbara 02/15/2021, 3:20 PM

## 2021-02-15 NOTE — Evaluation (Signed)
Occupational Therapy Evaluation Patient Details Name: Derrick Lawrence MRN: [id number] DOB: Jul 23, 1940 Today's Date: 02/15/2021   History of Present Illness in ED patient was found to have acute cholecystitis on CT abdomen and pelvis, sodium of 129, creatinine of 1.7, white blood cell of 21, no evidence of gallstone and bile duct, ED discussed with general surgery, who recommended admission, surgery planned for Saturday.   Clinical Impression    Pt agreeable to OT/PT co-evaluation this am. Nsg reports he just had morphine prior to OT arrival. Pt demonstrating mod independence in ADLs and supervision/mod independence for mobility while using RW. Pt typically completes ADLs and functional mobility independently, however pain and SOB increasing difficulty at this time. Pt is close to baseline, however is schedule for laparoscopic cholecystectomy tomorrow and OT will continue to see pt and check back to reassess function post sx.      Recommendations for follow up therapy are one component of a multi-disciplinary discharge planning process, led by the attending physician.  Recommendations may be updated based on patient status, additional functional criteria and insurance authorization.   Follow Up Recommendations  Other (comment) (TBD pending function post sx)    Assistance Recommended at Discharge Intermittent Supervision/Assistance  Functional Status Assessment  Patient has had a recent decline in their functional status and demonstrates the ability to make significant improvements in function in a reasonable and predictable amount of time.  Equipment Recommendations  None recommended by OT       Precautions / Restrictions Precautions Precautions: Fall Restrictions Weight Bearing Restrictions: No      Mobility Bed Mobility Overal bed mobility: Modified Independent                  Transfers Overall transfer level: Needs assistance Equipment used: Rolling walker (2  wheels) Transfers: Sit to/from Stand;Bed to chair/wheelchair/BSC Sit to Stand: Supervision     Step pivot transfers: Supervision                ADL either performed or assessed with clinical judgement      Vision Baseline Vision/History: 0 No visual deficits Ability to See in Adequate Light: 0 Adequate Patient Visual Report: No change from baseline Vision Assessment?: No apparent visual deficits            Pertinent Vitals/Pain Pain Assessment: Faces Faces Pain Scale: Hurts a little bit Pain Location: abdomen Pain Descriptors / Indicators: Discomfort;Grimacing Pain Intervention(s): Limited activity within patient's tolerance;Monitored during session;Premedicated before session;Repositioned     Hand Dominance Right   Extremity/Trunk Assessment Upper Extremity Assessment Upper Extremity Assessment: Overall WFL for tasks assessed   Lower Extremity Assessment Lower Extremity Assessment: Defer to PT evaluation   Cervical / Trunk Assessment Cervical / Trunk Assessment: Normal   Communication Communication Communication: No difficulties   Cognition Arousal/Alertness: Awake/alert Behavior During Therapy: WFL for tasks assessed/performed Overall Cognitive Status: Within Functional Limits for tasks assessed                                                  Home Living Family/patient expects to be discharged to:: Private residence Living Arrangements: Spouse/significant other Available Help at Discharge: Family;Available PRN/intermittently Type of Home: House Home Access: Ramped entrance     Home Layout: One level         Bathroom Toilet: Handicapped height  Home Equipment: Conservation officer, nature (2 wheels);BSC/3in1;Shower seat;Grab bars - tub/shower   Additional Comments: 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      Prior Functioning/Environment Prior Level of Function : Independent/Modified Independent                         OT Problem List: Decreased strength;Decreased activity tolerance;Decreased safety awareness      OT Treatment/Interventions: Self-care/ADL training;Therapeutic exercise;DME and/or AE instruction;Therapeutic activities;Patient/family education    OT Goals(Current goals can be found in the care plan section) Acute Rehab OT Goals Patient Stated Goal: To have surgery and go home OT Goal Formulation: With patient Time For Goal Achievement: 03/01/21 Potential to Achieve Goals: Good  OT Frequency: Min 1X/week   Barriers to D/C: Decreased caregiver support                End of Session Equipment Utilized During Treatment: Rolling walker (2 wheels)  Activity Tolerance: Patient tolerated treatment well Patient left: in chair;with call bell/phone within reach;with chair alarm set  OT Visit Diagnosis: Muscle weakness (generalized) (M62.81)                Time: 3785-8850 OT Time Calculation (min): 21 min Charges:  OT General Charges $OT Visit: 1 Visit OT Evaluation $OT Eval Low Complexity: 1 Low    Derrick Lawrence, OTR/L  [phone] 02/15/2021, 11:52 AM

## 2021-02-15 NOTE — Evaluation (Signed)
Physical Therapy Evaluation Patient Details Name: Derrick Lawrence MRN: [id number] DOB: [birth year] Today's Date: 02/15/2021  History of Present Illness  in ED patient was found to have acute cholecystitis on CT abdomen and pelvis, sodium of 129, creatinine of 1.7, white blood cell of 21, no evidence of gallstone and bile duct, ED discussed with general surgery, who recommended admission, surgery planned for Saturday.   Clinical Impression  Patient limited for functional mobility as stated below secondary to BLE weakness, fatigue, pain, and impaired standing balance. Patient does not require physical assist for bed mobility/transfers. Patient transfers and ambulates with use of RW with slightly unsteady cadence and gradually increasing SOB. Patient ends session seated in chair. Patient will be having surgery tomorrow and will need physical therapy reassessment following to determine further follow up recommendations. Patient will benefit from continued physical therapy in hospital and recommended venue below to increase strength, balance, endurance for safe ADLs and gait.        Recommendations for follow up therapy are one component of a multi-disciplinary discharge planning process, led by the attending physician.  Recommendations may be updated based on patient status, additional functional criteria and insurance authorization.  Follow Up Recommendations Other (comment) (TBD pending post surgery)    Assistance Recommended at Discharge    Functional Status Assessment Patient has had a recent decline in their functional status and demonstrates the ability to make significant improvements in function in a reasonable and predictable amount of time.  Equipment Recommendations       Recommendations for Other Services       Precautions / Restrictions Precautions Precautions: Fall Restrictions Weight Bearing Restrictions: No      Mobility  Bed Mobility Overal bed mobility: Modified  Independent                  Transfers Overall transfer level: Modified independent Equipment used: Rolling walker (2 wheels) Transfers: Sit to/from Stand;Bed to chair/wheelchair/BSC Sit to Stand: Supervision;Modified independent (Device/Increase time)   Step pivot transfers: Supervision            Ambulation/Gait Ambulation/Gait assistance: Min guard;Min assist Gait Distance (Feet): 25 Feet Assistive device: Rolling walker (2 wheels)         General Gait Details: slow, labored cadence with gradual increase in SOB; cueing for proper RW use with fair carry over  Stairs            Wheelchair Mobility    Modified Rankin (Stroke Patients Only)       Balance Overall balance assessment: Mild deficits observed, not formally tested                                           Pertinent Vitals/Pain Pain Assessment: No/denies pain Faces Pain Scale: Hurts a little bit Pain Location: abdomen Pain Descriptors / Indicators: Discomfort;Grimacing Pain Intervention(s): Limited activity within patient's tolerance;Monitored during session;Premedicated before session;Repositioned    Home Living Family/patient expects to be discharged to:: Private residence Living Arrangements: Spouse/significant other Available Help at Discharge: Family;Available PRN/intermittently Type of Home: House Home Access: Ramped entrance       Home Layout: One level Home Equipment: BSC/3in1;Shower seat;Grab bars - tub/shower Additional Comments: 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    Prior Function Prior Level of Function : Independent/Modified Independent  Mobility Comments: Patient states mostly household ambulator without AD ADLs Comments: Independent with ADL     Hand Dominance   Dominant Hand: Right    Extremity/Trunk Assessment   Upper Extremity Assessment Upper Extremity Assessment: Defer to OT evaluation    Lower Extremity  Assessment Lower Extremity Assessment: Overall WFL for tasks assessed;Generalized weakness    Cervical / Trunk Assessment Cervical / Trunk Assessment: Normal  Communication   Communication: No difficulties  Cognition Arousal/Alertness: Awake/alert Behavior During Therapy: WFL for tasks assessed/performed Overall Cognitive Status: Within Functional Limits for tasks assessed                                          General Comments      Exercises     Assessment/Plan    PT Assessment Patient needs continued PT services  PT Problem List Decreased strength;Decreased mobility;Decreased activity tolerance;Decreased balance;Pain       PT Treatment Interventions DME instruction;Therapeutic exercise;Gait training;Balance training;Stair training;Neuromuscular re-education;Functional mobility training;Therapeutic activities;Patient/family education    PT Goals (Current goals can be found in the Care Plan section)  Acute Rehab PT Goals Patient Stated Goal: Return home PT Goal Formulation: With patient Time For Goal Achievement: 03/01/21 Potential to Achieve Goals: Good    Frequency Min 3X/week   Barriers to discharge        Co-evaluation PT/OT/SLP Co-Evaluation/Treatment: Yes Reason for Co-Treatment: To address functional/ADL transfers PT goals addressed during session: Mobility/safety with mobility;Balance;Proper use of DME;Strengthening/ROM         AM-PAC PT "6 Clicks" Mobility  Outcome Measure Help needed turning from your back to your side while in a flat bed without using bedrails?: None Help needed moving from lying on your back to sitting on the side of a flat bed without using bedrails?: None Help needed moving to and from a bed to a chair (including a wheelchair)?: A Little Help needed standing up from a chair using your arms (e.g., wheelchair or bedside chair)?: A Little Help needed to walk in hospital room?: A Little Help needed climbing 3-5  steps with a railing? : A Lot 6 Click Score: 19    End of Session   Activity Tolerance: Patient tolerated treatment well;Patient limited by pain;Patient limited by fatigue Patient left: in chair;with call bell/phone within reach;with chair alarm set Nurse Communication: Mobility status PT Visit Diagnosis: Unsteadiness on feet (R26.81);Other abnormalities of gait and mobility (R26.89);Muscle weakness (generalized) (M62.81)    Time: 0814-4818 PT Time Calculation (min) (ACUTE ONLY): 16 min   Charges:   PT Evaluation $PT Eval Low Complexity: 1 Low          12:38 PM, 02/15/21 Mearl Latin PT, DPT Physical Therapist at [facility]

## 2021-02-15 NOTE — Plan of Care (Signed)
  Problem: Acute Rehab OT Goals (only OT should resolve) Goal: Pt. Will Perform Eating Flowsheets (Taken 02/15/2021 1156) Pt Will Perform Eating:  with modified independence  sitting Goal: Pt. Will Perform Grooming Flowsheets (Taken 02/15/2021 1156) Pt Will Perform Grooming:  with supervision  standing Goal: Pt. Will Perform Upper Body Dressing Flowsheets (Taken 02/15/2021 1156) Pt Will Perform Upper Body Dressing:  with modified independence  sitting Goal: Pt. Will Perform Lower Body Dressing Flowsheets (Taken 02/15/2021 1156) Pt Will Perform Lower Body Dressing:  with supervision  sitting/lateral leans  sit to/from stand Goal: Pt. Will Transfer To Toilet Flowsheets (Taken 02/15/2021 1156) Pt Will Transfer to Toilet:  with supervision  ambulating  regular height toilet Goal: Pt. Will Perform Toileting-Clothing Manipulation Flowsheets (Taken 02/15/2021 1156) Pt Will Perform Toileting - Clothing Manipulation and hygiene:  sitting/lateral leans  sit to/from stand  with supervision

## 2021-02-15 NOTE — Progress Notes (Signed)
Patient surgical flowsheet began, mrsa pcr swab sent to lab. Patient consent formed signed. Patient has had output via his condom cath, and we have been monitoring his post residuals. He complains of no pain to the pubic area. Has been medicated throughout the shift with morphine per order for intermittent pain to the right abd.

## 2021-02-15 NOTE — Progress Notes (Signed)
PROGRESS NOTE    Derrick Lawrence  XBL:[id number] DOB: 12-06-1940 DOA: 02/13/2021 PCP: Christain Sacramento, MD    Brief Narrative:  80 year old with history of hypertension, hyperlipidemia, stroke with right upper extremity hemiparesis and chronic low back pain with radiculopathy presented to the ER with 2 days of abdominal pain, nausea and vomiting.  He was found to have acute cholecystitis on CT scan of the abdomen pelvis along with acute kidney injury.  Admit to the hospital with surgical consultation.   Assessment & Plan:   Principal Problem:   Acute cholecystitis Active Problems:   HTN (hypertension)   Hyperlipidemia   Hyponatremia   CVA (cerebral vascular accident) ([place])  Acute calculus cholecystitis: IV fluids.  Clears.  Adequate pain medications with IV and oral opiates.  IV antibiotic with Zosyn.  Anticipated lap chole tomorrow.  Acute kidney injury/dehydration with hyponatremia: Hold all antihypertensive medications.  Continue isotonic fluid resuscitation.  Recheck kidney functions tomorrow morning.   Kidney functions are trending towards normal.  Urine output is adequate.  Lumbar radiculopathy, nerve root compression on MRI done as outpatient on 11/21. Discussed with neurosurgery.  Patient is planning to follow-up with Dr. Marcello Moores as outpatient.  History of stroke with right upper extremity hemiparesis: Stable.  No new neurological deficits.  Aspirin on hold.  Statin on hold until surgery.  Essential hypertension: Blood pressures normal to low normal.  Hold all antihypertensives.  Hypokalemia: Replaced.  Monitor level.  Magnesium is adequate.  Acute urinary retention: Secondary to debility, bedbound status and opiate use.  More than 900 mL straight cath overnight.  Continue to monitor with bladder scan, if retaining more than 500 mL, will insert Foley catheter until recovered from surgery.   DVT prophylaxis: SCD's Start: 02/15/21 0952 heparin injection 5,000 Units  Start: 02/13/21 2200 SCDs Start: 02/13/21 2036   Code Status: Full code Family Communication: Grandson at bedside Disposition Plan: Status is: Inpatient  Remains inpatient appropriate because: Ongoing need for IV antibiotics, IV fluids and inpatient surgical intervention.         Consultants:  General surgery  Procedures:  None  Antimicrobials:  Zosyn 11/23----   Subjective: Patient seen and examined.  Overnight he was distressed because of need for catheterization.  He had more than 900 mL in the bladder.  Wants to avoid catheter and he thinks he can urinate today.  Denies any abdomen pain nausea vomiting.  Breathing is slightly better today.  Looking forward for surgery.   Objective: Vitals:   02/14/21 1407 02/14/21 1559 02/14/21 2000 02/15/21 0418  BP: 132/72  118/61 128/67  Pulse: 86  (!) 41 93  Resp: 19  20 (!) 21  Temp: 98.6 F (37 C)  99 F (37.2 C) 97.9 F (36.6 C)  TempSrc: Oral  Oral Oral  SpO2: 93% 94% 96% 93%  Weight:      Height:        Intake/Output Summary (Last 24 hours) at 02/15/2021 1105 Last data filed at 02/15/2021 0848 Gross per 24 hour  Intake 2127.2 ml  Output 1650 ml  Net 477.2 ml   Filed Weights   02/13/21 1106 02/13/21 1823  Weight: 102.5 kg 101.2 kg    Examination:  General exam: Appears calm and comfortable at this time.  On room air.  Slightly anxious. Respiratory system: Clear to auscultation. Respiratory effort normal. Cardiovascular system: S1 & S2 heard, RRR. No JVD, murmurs, rubs, gallops or clicks. No pedal edema. Gastrointestinal system: Soft.  Nontender.  No rigidity  or guarding.  Bowel sounds present. Central nervous system: Alert and oriented.  Anxious.    Data Reviewed: I have personally reviewed following labs and imaging studies  CBC: Recent Labs  Lab 02/13/21 1120 02/14/21 0620 02/15/21 0605  WBC 21.3* 18.1* 12.9*  NEUTROABS 18.5*  --  10.5*  HGB 14.6 13.1 12.8*  HCT 43.1 38.4* 38.4*  MCV 90.7  90.6 90.1  PLT 144* 135* 563*   Basic Metabolic Panel: Recent Labs  Lab 02/13/21 1120 02/14/21 0620 02/15/21 0605  NA 129* 127* 130*  K 3.5 3.7 3.2*  CL 92* 94* 97*  CO2 25 25 21*  GLUCOSE 117* 96 110*  BUN 27* 42* 45*  CREATININE 1.75* 2.08* 1.73*  CALCIUM 8.5* 7.7* 7.6*  MG  --   --  1.7   GFR: Estimated Creatinine Clearance: 40.6 mL/min (A) (by C-G formula based on SCr of 1.73 mg/dL (H)). Liver Function Tests: Recent Labs  Lab 02/13/21 1120 02/14/21 0620 02/15/21 0605  AST 18 17 21   ALT 15 15 17   ALKPHOS 72 62 67  BILITOT 1.3* 1.4* 1.3*  PROT 6.5 5.7* 5.8*  ALBUMIN 3.4* 2.8* 2.7*   Recent Labs  Lab 02/13/21 1120  LIPASE 28   No results for input(s): AMMONIA in the last 168 hours. Coagulation Profile: No results for input(s): INR, PROTIME in the last 168 hours. Cardiac Enzymes: No results for input(s): CKTOTAL, CKMB, CKMBINDEX, TROPONINI in the last 168 hours. BNP (last 3 results) No results for input(s): PROBNP in the last 8760 hours. HbA1C: No results for input(s): HGBA1C in the last 72 hours. CBG: No results for input(s): GLUCAP in the last 168 hours. Lipid Profile: No results for input(s): CHOL, HDL, LDLCALC, TRIG, CHOLHDL, LDLDIRECT in the last 72 hours. Thyroid Function Tests: No results for input(s): TSH, T4TOTAL, FREET4, T3FREE, THYROIDAB in the last 72 hours. Anemia Panel: No results for input(s): VITAMINB12, FOLATE, FERRITIN, TIBC, IRON, RETICCTPCT in the last 72 hours. Sepsis Labs: Recent Labs  Lab 02/13/21 1223  LATICACIDVEN 1.9    No results found for this or any previous visit (from the past 240 hour(s)).       Radiology Studies: CT Abdomen Pelvis W Contrast  Result Date: 02/13/2021 CLINICAL DATA:  Abdominal pain EXAM: CT ABDOMEN AND PELVIS WITH CONTRAST TECHNIQUE: Multidetector CT imaging of the abdomen and pelvis was performed using the standard protocol following bolus administration of intravenous contrast. CONTRAST:  38mL  OMNIPAQUE IOHEXOL 300 MG/ML  SOLN COMPARISON:  None. FINDINGS: Lower chest: No acute abnormality. Hepatobiliary: No suspicious liver lesions. Dilated gallbladder with surrounding inflammatory change and gallstones. No biliary ductal dilation. Pancreas: Unremarkable. No pancreatic ductal dilatation or surrounding inflammatory changes. Spleen: Normal in size without focal abnormality. Adrenals/Urinary Tract: Adrenal glands are unremarkable. No hydronephrosis or nephrolithiasis. Minimally complex cyst of the lower pole of the right kidney (Bosniak II). Simple cyst of the mid region of the left kidney. Stomach/Bowel: Stomach is within normal limits. Appendix is normal. No evidence of bowel wall thickening, distention, or inflammatory changes. CT Vascular/Lymphatic: Aortic atherosclerosis. No enlarged abdominal or pelvic lymph nodes. Reproductive: Mild prostatomegaly. Other: No abdominal wall hernia or abnormality. No abdominopelvic ascites. Musculoskeletal: No acute or significant osseous findings. IMPRESSION: 1. Dilated gallbladder with surrounding inflammatory change and gallstones, findings are compatible with acute cholecystitis. 2.  Aortic Atherosclerosis (ICD10-I70.0). Electronically Signed   By: Yetta Glassman M.D.   On: 02/13/2021 13:19   DG CHEST PORT 1 VIEW  Result Date: 02/14/2021 CLINICAL DATA:  Shortness of breath and cough. EXAM: PORTABLE CHEST 1 VIEW COMPARISON:  01/16/2019 FINDINGS: The cardiomediastinal silhouette is unremarkable. Mild peribronchial thickening is noted. There is no evidence of focal airspace disease, pulmonary edema, suspicious pulmonary nodule/mass, pleural effusion, or pneumothorax. No acute bony abnormalities are identified. IMPRESSION: Mild peribronchial thickening without focal pneumonia. Electronically Signed   By: Margarette Canada M.D.   On: 02/14/2021 16:26        Scheduled Meds:  Chlorhexidine Gluconate Cloth  6 each Topical Once   And   Chlorhexidine Gluconate Cloth   6 each Topical Once   heparin  5,000 Units Subcutaneous Q8H   hydrocortisone cream   Topical BID   Continuous Infusions:  0.9 % NaCl with KCl 20 mEq / L 50 mL/hr at 02/15/21 0923   piperacillin-tazobactam (ZOSYN)  IV 3.375 g (02/15/21 0520)   potassium chloride 10 mEq (02/15/21 1100)     LOS: 2 days    Time spent: 32 minutes    Barb Merino, MD [facility] Pager [phone]

## 2021-02-15 NOTE — Progress Notes (Signed)
Bladder scan 17ml. Reported to Dr. Sloan Leiter.

## 2021-02-15 NOTE — Progress Notes (Signed)
Patient PCR mrsa screen negative. D/C ointment. Placed SCDs in room, patient is declining them, states he will wear them tomorrow but has had them before and "can't stand 'em."

## 2021-02-15 NOTE — Progress Notes (Signed)
Patient received a prn breathing treatment earlier due to increased wheezing. Has been using the incentive spirometry, with his goal of 1562m being met.

## 2021-02-15 NOTE — Progress Notes (Signed)
Subjective: Patient feels much better today.  He has minimal right upper quadrant abdominal pain.  He is shortness of breath seems to have resolved.  Objective: Vital signs in last 24 hours: Temp:  [97.9 F (36.6 C)-99 F (37.2 C)] 97.9 F (36.6 C) (11/25 0418) Pulse Rate:  [41-93] 93 (11/25 0418) Resp:  [19-21] 21 (11/25 0418) BP: (118-132)/(61-72) 128/67 (11/25 0418) SpO2:  [93 %-96 %] 93 % (11/25 0418) Last BM Date: 02/12/21  Intake/Output from previous day: 11/24 0701 - 11/25 0700 In: 2011.2 [P.O.:960; I.V.:921; IV Piggyback:130.2] Out: 1850 [Urine:1850] Intake/Output this shift: Total I/O In: 356 [P.O.:356] Out: 50 [Urine:50]  General appearance: alert, cooperative, and no distress Resp: clear to auscultation bilaterally Cardio: regular rate and rhythm, S1, S2 normal, no murmur, click, rub or gallop GI: soft, non-tender; bowel sounds normal; no masses,  no organomegaly  Lab Results:  Recent Labs    02/14/21 0620 02/15/21 0605  WBC 18.1* 12.9*  HGB 13.1 12.8*  HCT 38.4* 38.4*  PLT 135* 143*   BMET Recent Labs    02/14/21 0620 02/15/21 0605  NA 127* 130*  K 3.7 3.2*  CL 94* 97*  CO2 25 21*  GLUCOSE 96 110*  BUN 42* 45*  CREATININE 2.08* 1.73*  CALCIUM 7.7* 7.6*   PT/INR No results for input(s): LABPROT, INR in the last 72 hours.  Studies/Results: CT Abdomen Pelvis W Contrast  Result Date: 02/13/2021 CLINICAL DATA:  Abdominal pain EXAM: CT ABDOMEN AND PELVIS WITH CONTRAST TECHNIQUE: Multidetector CT imaging of the abdomen and pelvis was performed using the standard protocol following bolus administration of intravenous contrast. CONTRAST:  15mL OMNIPAQUE IOHEXOL 300 MG/ML  SOLN COMPARISON:  None. FINDINGS: Lower chest: No acute abnormality. Hepatobiliary: No suspicious liver lesions. Dilated gallbladder with surrounding inflammatory change and gallstones. No biliary ductal dilation. Pancreas: Unremarkable. No pancreatic ductal dilatation or  surrounding inflammatory changes. Spleen: Normal in size without focal abnormality. Adrenals/Urinary Tract: Adrenal glands are unremarkable. No hydronephrosis or nephrolithiasis. Minimally complex cyst of the lower pole of the right kidney (Bosniak II). Simple cyst of the mid region of the left kidney. Stomach/Bowel: Stomach is within normal limits. Appendix is normal. No evidence of bowel wall thickening, distention, or inflammatory changes. CT Vascular/Lymphatic: Aortic atherosclerosis. No enlarged abdominal or pelvic lymph nodes. Reproductive: Mild prostatomegaly. Other: No abdominal wall hernia or abnormality. No abdominopelvic ascites. Musculoskeletal: No acute or significant osseous findings. IMPRESSION: 1. Dilated gallbladder with surrounding inflammatory change and gallstones, findings are compatible with acute cholecystitis. 2.  Aortic Atherosclerosis (ICD10-I70.0). Electronically Signed   By: Yetta Glassman M.D.   On: 02/13/2021 13:19   DG CHEST PORT 1 VIEW  Result Date: 02/14/2021 CLINICAL DATA:  Shortness of breath and cough. EXAM: PORTABLE CHEST 1 VIEW COMPARISON:  01/16/2019 FINDINGS: The cardiomediastinal silhouette is unremarkable. Mild peribronchial thickening is noted. There is no evidence of focal airspace disease, pulmonary edema, suspicious pulmonary nodule/mass, pleural effusion, or pneumothorax. No acute bony abnormalities are identified. IMPRESSION: Mild peribronchial thickening without focal pneumonia. Electronically Signed   By: Margarette Canada M.D.   On: 02/14/2021 16:26    Anti-infectives: Anti-infectives (From admission, onward)    Start     Dose/Rate Route Frequency Ordered Stop   02/13/21 1615  piperacillin-tazobactam (ZOSYN) IVPB 3.375 g        3.375 g 12.5 mL/hr over 240 Minutes Intravenous Every 8 hours 02/13/21 1600         Assessment/Plan: Impression: Leukocytosis almost normal.  LFTs within normal limits.  His shortness of breath seems to have resolved. Plan: We  will proceed with laparoscopic cholecystectomy tomorrow.  The risks and benefits of the procedure including bleeding, infection, hepatobiliary injury, and the possibility of an open procedure were fully explained to the patient, who gave informed consent.  LOS: 2 days    Aviva Signs 02/15/2021

## 2021-02-15 NOTE — Progress Notes (Signed)
Patient stated he had someone coming to bring him some food. Explained he was on liquid diet. He said "doc said I could eat til midnight". Explained that was liquids until midnight. He understands.

## 2021-02-15 NOTE — Plan of Care (Signed)
  Problem: Acute Rehab PT Goals(only PT should resolve) Goal: Patient Will Transfer Sit To/From Stand Outcome: Progressing Flowsheets (Taken 02/15/2021 1239) Patient will transfer sit to/from stand: with modified independence Goal: Pt Will Transfer Bed To Chair/Chair To Bed Outcome: Progressing Flowsheets (Taken 02/15/2021 1239) Pt will Transfer Bed to Chair/Chair to Bed: with modified independence Goal: Pt Will Ambulate Outcome: Progressing Flowsheets (Taken 02/15/2021 1239) Pt will Ambulate:  50 feet  with modified independence  with least restrictive assistive device Goal: Pt/caregiver will Perform Home Exercise Program Outcome: Progressing Flowsheets (Taken 02/15/2021 1239) Pt/caregiver will Perform Home Exercise Program:  For increased strengthening  For improved balance  Independently  12:39 PM, 02/15/21 Mearl Latin PT, DPT Physical Therapist at [facility]

## 2021-02-16 ENCOUNTER — Inpatient Hospital Stay (HOSPITAL_COMMUNITY): Payer: PPO | Admitting: Anesthesiology

## 2021-02-16 ENCOUNTER — Encounter (HOSPITAL_COMMUNITY)
Admission: EM | Disposition: A | Discharge status: Home / Self Care | Payer: Self-pay | Source: Home / Self Care | Attending: Internal Medicine

## 2021-02-16 LAB — CBC WITH DIFFERENTIAL/PLATELET
Abs Immature Granulocytes: 0.08 10*3/uL — ABNORMAL HIGH (ref 0.00–0.07)
Basophils Absolute: 0.1 10*3/uL (ref 0.0–0.1)
Basophils Relative: 1 %
Eosinophils Absolute: 0.3 10*3/uL (ref 0.0–0.5)
Eosinophils Relative: 3 %
HCT: 34.3 % — ABNORMAL LOW (ref 39.0–52.0)
Hemoglobin: 11.6 g/dL — ABNORMAL LOW (ref 13.0–17.0)
Immature Granulocytes: 1 %
Lymphocytes Relative: 7 %
Lymphs Abs: 0.8 10*3/uL (ref 0.7–4.0)
MCH: 30.7 pg (ref 26.0–34.0)
MCHC: 33.8 g/dL (ref 30.0–36.0)
MCV: 90.7 fL (ref 80.0–100.0)
Monocytes Absolute: 0.7 10*3/uL (ref 0.1–1.0)
Monocytes Relative: 7 %
Neutro Abs: 8.4 10*3/uL — ABNORMAL HIGH (ref 1.7–7.7)
Neutrophils Relative %: 81 %
Platelets: 154 10*3/uL (ref 150–400)
RBC: 3.78 MIL/uL — ABNORMAL LOW (ref 4.22–5.81)
RDW: 13.3 % (ref 11.5–15.5)
WBC: 10.3 10*3/uL (ref 4.0–10.5)
nRBC: 0 % (ref 0.0–0.2)

## 2021-02-16 LAB — COMPREHENSIVE METABOLIC PANEL
ALT: 28 U/L (ref 0–44)
AST: 31 U/L (ref 15–41)
Albumin: 2.5 g/dL — ABNORMAL LOW (ref 3.5–5.0)
Alkaline Phosphatase: 64 U/L (ref 38–126)
Anion gap: 7 (ref 5–15)
BUN: 27 mg/dL — ABNORMAL HIGH (ref 8–23)
CO2: 23 mmol/L (ref 22–32)
Calcium: 8 mg/dL — ABNORMAL LOW (ref 8.9–10.3)
Chloride: 103 mmol/L (ref 98–111)
Creatinine, Ser: 1.22 mg/dL (ref 0.61–1.24)
GFR, Estimated: >60 mL/min (ref >60)
Glucose, Bld: 116 mg/dL — ABNORMAL HIGH (ref 70–99)
Potassium: 3.7 mmol/L (ref 3.5–5.1)
Sodium: 133 mmol/L — ABNORMAL LOW (ref 135–145)
Total Bilirubin: 0.9 mg/dL (ref 0.3–1.2)
Total Protein: 5.5 g/dL — ABNORMAL LOW (ref 6.5–8.1)

## 2021-02-16 LAB — PHOSPHORUS: Phosphorus: 2.1 mg/dL — ABNORMAL LOW (ref 2.5–4.6)

## 2021-02-16 LAB — TROPONIN I (HIGH SENSITIVITY): Troponin I (High Sensitivity): 9 ng/L (ref <18)

## 2021-02-16 LAB — MAGNESIUM: Magnesium: 1.9 mg/dL (ref 1.7–2.4)

## 2021-02-16 SURGERY — CANCELLED PROCEDURE
Anesthesia: General

## 2021-02-16 MED ORDER — HEMOSTATIC AGENTS (NO CHARGE) OPTIME
TOPICAL | Status: DC | PRN
  Administered 2021-02-16: 1 via TOPICAL

## 2021-02-16 MED ORDER — METOPROLOL TARTRATE 5 MG/5ML IV SOLN
5.0000 mg | Freq: Once | INTRAVENOUS | Status: AC
  Administered 2021-02-16: 5 mg via INTRAVENOUS
  Filled 2021-02-16: qty 5

## 2021-02-16 MED ORDER — SUCCINYLCHOLINE CHLORIDE 200 MG/10ML IV SOSY
PREFILLED_SYRINGE | INTRAVENOUS | Status: AC
  Filled 2021-02-16: qty 10

## 2021-02-16 MED ORDER — IPRATROPIUM-ALBUTEROL 0.5-2.5 (3) MG/3ML IN SOLN
3.0000 mL | Freq: Once | RESPIRATORY_TRACT | Status: AC
  Administered 2021-02-16: 3 mL via RESPIRATORY_TRACT

## 2021-02-16 MED ORDER — POLYVINYL ALCOHOL 1.4 % OP SOLN
1.0000 [drp] | OPHTHALMIC | Status: DC | PRN
  Administered 2021-02-16: 1 [drp] via OPHTHALMIC
  Filled 2021-02-16: qty 15

## 2021-02-16 MED ORDER — ONDANSETRON HCL 4 MG/2ML IJ SOLN
INTRAMUSCULAR | Status: AC
  Filled 2021-02-16: qty 2

## 2021-02-16 MED ORDER — ROCURONIUM BROMIDE 10 MG/ML (PF) SYRINGE
PREFILLED_SYRINGE | INTRAVENOUS | Status: AC
  Filled 2021-02-16: qty 10

## 2021-02-16 MED ORDER — SODIUM CHLORIDE 0.9 % IR SOLN
Status: DC | PRN
  Administered 2021-02-16: 1000 mL

## 2021-02-16 MED ORDER — FENTANYL CITRATE (PF) 100 MCG/2ML IJ SOLN
INTRAMUSCULAR | Status: DC | PRN
  Administered 2021-02-16: 50 ug via INTRAVENOUS

## 2021-02-16 MED ORDER — DEXMEDETOMIDINE (PRECEDEX) IN NS 20 MCG/5ML (4 MCG/ML) IV SYRINGE
PREFILLED_SYRINGE | INTRAVENOUS | Status: AC
  Filled 2021-02-16: qty 5

## 2021-02-16 MED ORDER — ONDANSETRON HCL 4 MG/2ML IJ SOLN
INTRAMUSCULAR | Status: DC | PRN
  Administered 2021-02-16: 4 mg via INTRAVENOUS

## 2021-02-16 MED ORDER — BUPIVACAINE LIPOSOME 1.3 % IJ SUSP
INTRAMUSCULAR | Status: DC | PRN
  Administered 2021-02-16: 20 mL

## 2021-02-16 MED ORDER — DEXAMETHASONE SODIUM PHOSPHATE 10 MG/ML IJ SOLN
INTRAMUSCULAR | Status: AC
  Filled 2021-02-16: qty 1

## 2021-02-16 MED ORDER — IPRATROPIUM-ALBUTEROL 0.5-2.5 (3) MG/3ML IN SOLN
RESPIRATORY_TRACT | Status: AC
  Filled 2021-02-16: qty 3

## 2021-02-16 MED ORDER — PROPOFOL 10 MG/ML IV BOLUS
INTRAVENOUS | Status: AC
  Filled 2021-02-16: qty 20

## 2021-02-16 MED ORDER — BUPIVACAINE LIPOSOME 1.3 % IJ SUSP
INTRAMUSCULAR | Status: AC
  Filled 2021-02-16: qty 20

## 2021-02-16 MED ORDER — FENTANYL CITRATE (PF) 250 MCG/5ML IJ SOLN
INTRAMUSCULAR | Status: AC
  Filled 2021-02-16: qty 5

## 2021-02-16 MED ORDER — LIDOCAINE HCL (PF) 2 % IJ SOLN
INTRAMUSCULAR | Status: AC
  Filled 2021-02-16: qty 5

## 2021-02-16 MED ORDER — LEVALBUTEROL HCL 0.63 MG/3ML IN NEBU: 0.6300 mg | INHALATION_SOLUTION | Freq: Four times a day (QID) | RESPIRATORY_TRACT | Status: DC | PRN

## 2021-02-16 MED ORDER — CHLORHEXIDINE GLUCONATE CLOTH 2 % EX PADS
6.0000 | MEDICATED_PAD | Freq: Every day | CUTANEOUS | Status: DC
  Administered 2021-02-16: 6 via TOPICAL

## 2021-02-16 SURGICAL SUPPLY — 39 items
APPLICATOR ARISTA FLEXITIP XL (MISCELLANEOUS) IMPLANT
APPLIER CLIP ROT 10 11.4 M/L (STAPLE) ×2
BAG RETRIEVAL 10 (BASKET) ×1
CHLORAPREP W/TINT 26 (MISCELLANEOUS) ×2 IMPLANT
CLIP APPLIE ROT 10 11.4 M/L (STAPLE) ×1 IMPLANT
CLOTH BEACON ORANGE TIMEOUT ST (SAFETY) ×2 IMPLANT
COVER LIGHT HANDLE STERIS (MISCELLANEOUS) ×4 IMPLANT
DERMABOND ADVANCED (GAUZE/BANDAGES/DRESSINGS) ×1
DERMABOND ADVANCED .7 DNX12 (GAUZE/BANDAGES/DRESSINGS) ×1 IMPLANT
ELECT REM PT RETURN 9FT ADLT (ELECTROSURGICAL) ×2
ELECTRODE REM PT RTRN 9FT ADLT (ELECTROSURGICAL) ×1 IMPLANT
GAUZE 4X4 16PLY ~~LOC~~+RFID DBL (SPONGE) ×2 IMPLANT
GLOVE SURG POLYISO LF SZ7.5 (GLOVE) ×2 IMPLANT
GLOVE SURG UNDER POLY LF SZ7 (GLOVE) ×4 IMPLANT
GOWN STRL REUS W/TWL LRG LVL3 (GOWN DISPOSABLE) ×6 IMPLANT
HEMOSTAT ARISTA ABSORB 3G PWDR (HEMOSTASIS) IMPLANT
HEMOSTAT SNOW SURGICEL 2X4 (HEMOSTASIS) ×2 IMPLANT
INST SET LAPROSCOPIC AP (KITS) ×2 IMPLANT
KIT TURNOVER KIT A (KITS) ×2 IMPLANT
MANIFOLD NEPTUNE II (INSTRUMENTS) ×2 IMPLANT
NEEDLE HYPO 18GX1.5 BLUNT FILL (NEEDLE) ×2 IMPLANT
NEEDLE HYPO 21X1.5 SAFETY (NEEDLE) ×2 IMPLANT
NEEDLE INSUFFLATION 14GA 120MM (NEEDLE) ×2 IMPLANT
NS IRRIG 1000ML POUR BTL (IV SOLUTION) ×2 IMPLANT
PACK LAP CHOLE LZT030E (CUSTOM PROCEDURE TRAY) ×2 IMPLANT
PAD ARMBOARD 7.5X6 YLW CONV (MISCELLANEOUS) ×2 IMPLANT
SET BASIN LINEN APH (SET/KITS/TRAYS/PACK) ×2 IMPLANT
SET TUBE SMOKE EVAC HIGH FLOW (TUBING) ×2 IMPLANT
SLEEVE ENDOPATH XCEL 5M (ENDOMECHANICALS) ×2 IMPLANT
SUT MNCRL AB 4-0 PS2 18 (SUTURE) ×4 IMPLANT
SUT VICRYL 0 UR6 27IN ABS (SUTURE) ×2 IMPLANT
SYR 20ML LL LF (SYRINGE) ×4 IMPLANT
SYS BAG RETRIEVAL 10MM (BASKET) ×1
SYSTEM BAG RETRIEVAL 10MM (BASKET) ×1 IMPLANT
TROCAR ENDO BLADELESS 11MM (ENDOMECHANICALS) ×2 IMPLANT
TROCAR XCEL NON-BLD 5MMX100MML (ENDOMECHANICALS) ×2 IMPLANT
TROCAR XCEL UNIV SLVE 11M 100M (ENDOMECHANICALS) ×2 IMPLANT
TUBE CONNECTING 12X1/4 (SUCTIONS) ×2 IMPLANT
WARMER LAPAROSCOPE (MISCELLANEOUS) ×2 IMPLANT

## 2021-02-16 NOTE — Progress Notes (Signed)
PROGRESS NOTE    Derrick Lawrence  ACZ:[id number] DOB: [birth year] DOA: 02/13/2021 PCP: Christain Sacramento, MD    Brief Narrative:  80 year old with history of hypertension, hyperlipidemia, stroke with right upper extremity hemiparesis and chronic low back pain with radiculopathy presented to the ER with 2 days of abdominal pain, nausea and vomiting.  He was found to have acute cholecystitis on CT scan of the abdomen pelvis along with acute kidney injury.  Admitted to the hospital with surgical consultation. 10/26, he was taken to lap chole.  Patient was noted to have transient bigeminy and 4 runs of V. tach otherwise sinus rhythm.  Surgery was canceled due to risk of anesthesia.  Patient had received DuoNebs immediately preceding the event.   Assessment & Plan:   Principal Problem:   Acute cholecystitis Active Problems:   HTN (hypertension)   Hyperlipidemia   Hyponatremia   CVA (cerebral vascular accident) ([place])  Acute calculus cholecystitis: Some clinical improvement.  Surgery canceled today as per above.  Continue low-dose IV fluids.  Continue Zosyn.  Pain is well controlled on current medications.  Lap chole after cardiac clearance.  Advancing to regular diet today.  Acute kidney injury/dehydration with hyponatremia: Treated with isotonic fluid resuscitation.  Renal functions are adequately normalized.  Urine output is adequate.  Holding antihypertensives.  Lumbar radiculopathy, nerve root compression on MRI done as outpatient on 11/21. Discussed with neurosurgery.  Patient is planning to follow-up with Dr. Marcello Moores as outpatient.  History of stroke with right upper extremity hemiparesis: Stable.  No new neurological deficits.  Aspirin on hold.  Statin on hold until surgery.  Essential hypertension: Blood pressures normal to low normal.  Hold all antihypertensives.  Hypokalemia: Replaced.  Monitor level.  Magnesium is adequate.  Recheck electrolytes today.  Acute urinary retention:  Secondary to debility, bedbound status and opiate use.  More than 900 mL straight cath overnight.  Continue to monitor with bladder scan, if retaining more than 500 mL, will insert Foley catheter until recovered from surgery.  Nonsustained V. tach on preop evaluation: Patient with known history of cardiac arrhythmias or coronary artery disease.  Currently without any symptoms. Will recheck electrolytes and replace if low. We will keep patient on telemetry to evaluate his rhythm next 24 to 48 hours. Will order in for the echocardiogram, previous echocardiogram showed normal ejection fraction. His nonsustained V. tach is probably benign or may be associated with the use of albuterol.  Will change to Xopenex. Will ask cardiology clearance/evaluation on 11/28 before proceeding to surgery. If becomes asymptomatic for next 24 to 48 hours, may even go home with oral antibiotics, cardiology evaluation and elective lap chole.   DVT prophylaxis: SCD's Start: 02/15/21 0952 heparin injection 5,000 Units Start: 02/13/21 2200 SCDs Start: 02/13/21 2036   Code Status: Full code Family Communication: Grandson at bedside Disposition Plan: Status is: Inpatient  Remains inpatient appropriate because: Ongoing need for IV antibiotics, IV fluids and inpatient surgical intervention.         Consultants:  General surgery  Procedures:  None  Antimicrobials:  Zosyn 11/23----   Subjective:  I saw patient before he was taken to surgery.  Just hungry but denied any complaints. After his surgery was canceled, he has no complaints but just frustrated and wants to go home. Telemetry with sinus rhythm.   Objective: Vitals:   02/16/21 0959 02/16/21 1000 02/16/21 1030 02/16/21 1045  BP: (!) 150/71 (!) 150/71 (!) 144/67 139/81  Pulse: (!) 53 (!) 52 Marland Kitchen)  51 (!) 51  Resp: 20 15 17 15   Temp: 97.6 F (36.4 C)     TempSrc:      SpO2: 96% 97% 96% 97%  Weight:      Height:        Intake/Output Summary  (Last 24 hours) at 02/16/2021 1057 Last data filed at 02/16/2021 1053 Gross per 24 hour  Intake 2768.15 ml  Output 3700 ml  Net -931.85 ml   Filed Weights   02/13/21 1106 02/13/21 1823  Weight: 102.5 kg 101.2 kg    Examination:  General exam: Appears calm and comfortable at this time.  On room air. Respiratory system: Clear to auscultation. Respiratory effort normal.  Occasional expiratory wheezes present. Cardiovascular system: S1 & S2 heard, RRR. No JVD, murmurs, rubs, gallops or clicks. No pedal edema. Gastrointestinal system: Soft.  Nontender.  No rigidity or guarding.  Bowel sounds present. Central nervous system: Alert and oriented.  Anxious.    Data Reviewed: I have personally reviewed following labs and imaging studies  CBC: Recent Labs  Lab 02/13/21 1120 02/14/21 0620 02/15/21 0605 02/16/21 0509  WBC 21.3* 18.1* 12.9* 10.3  NEUTROABS 18.5*  --  10.5* 8.4*  HGB 14.6 13.1 12.8* 11.6*  HCT 43.1 38.4* 38.4* 34.3*  MCV 90.7 90.6 90.1 90.7  PLT 144* 135* 143* 735   Basic Metabolic Panel: Recent Labs  Lab 02/13/21 1120 02/14/21 0620 02/15/21 0605 02/16/21 0509  NA 129* 127* 130* 133*  K 3.5 3.7 3.2* 3.7  CL 92* 94* 97* 103  CO2 25 25 21* 23  GLUCOSE 117* 96 110* 116*  BUN 27* 42* 45* 27*  CREATININE 1.75* 2.08* 1.73* 1.22  CALCIUM 8.5* 7.7* 7.6* 8.0*  MG  --   --  1.7  --    GFR: Estimated Creatinine Clearance: 57.5 mL/min (by C-G formula based on SCr of 1.22 mg/dL). Liver Function Tests: Recent Labs  Lab 02/13/21 1120 02/14/21 0620 02/15/21 0605 02/16/21 0509  AST 18 17 21 31   ALT 15 15 17 28   ALKPHOS 72 62 67 64  BILITOT 1.3* 1.4* 1.3* 0.9  PROT 6.5 5.7* 5.8* 5.5*  ALBUMIN 3.4* 2.8* 2.7* 2.5*   Recent Labs  Lab 02/13/21 1120  LIPASE 28   No results for input(s): AMMONIA in the last 168 hours. Coagulation Profile: No results for input(s): INR, PROTIME in the last 168 hours. Cardiac Enzymes: No results for input(s): CKTOTAL, CKMB,  CKMBINDEX, TROPONINI in the last 168 hours. BNP (last 3 results) No results for input(s): PROBNP in the last 8760 hours. HbA1C: No results for input(s): HGBA1C in the last 72 hours. CBG: No results for input(s): GLUCAP in the last 168 hours. Lipid Profile: No results for input(s): CHOL, HDL, LDLCALC, TRIG, CHOLHDL, LDLDIRECT in the last 72 hours. Thyroid Function Tests: No results for input(s): TSH, T4TOTAL, FREET4, T3FREE, THYROIDAB in the last 72 hours. Anemia Panel: No results for input(s): VITAMINB12, FOLATE, FERRITIN, TIBC, IRON, RETICCTPCT in the last 72 hours. Sepsis Labs: Recent Labs  Lab 02/13/21 1223  LATICACIDVEN 1.9    Recent Results (from the past 240 hour(s))  Surgical PCR screen     Status: None   Collection Time: 02/15/21  2:31 PM   Specimen: Nasal Mucosa; Nasal Swab  Result Value Ref Range Status   MRSA, PCR NEGATIVE NEGATIVE Final   Staphylococcus aureus NEGATIVE NEGATIVE Final    Comment: (NOTE) The Xpert SA Assay (FDA approved for NASAL specimens in patients 21 years of age and older),  is one component of a comprehensive surveillance program. It is not intended to diagnose infection nor to guide or monitor treatment. Performed at [facility], [address] [place], [place] [address]          Radiology Studies: DG CHEST PORT 1 VIEW  Result Date: 02/14/2021 CLINICAL DATA:  Shortness of breath and cough. EXAM: PORTABLE CHEST 1 VIEW COMPARISON:  01/16/2019 FINDINGS: The cardiomediastinal silhouette is unremarkable. Mild peribronchial thickening is noted. There is no evidence of focal airspace disease, pulmonary edema, suspicious pulmonary nodule/mass, pleural effusion, or pneumothorax. No acute bony abnormalities are identified. IMPRESSION: Mild peribronchial thickening without focal pneumonia. Electronically Signed   By: Margarette Canada M.D.   On: 02/14/2021 16:26        Scheduled Meds:  [MAR Hold] heparin  5,000 Units Subcutaneous Q8H   [MAR Hold]  hydrocortisone cream   Topical BID   Continuous Infusions:  0.9 % NaCl with KCl 20 mEq / L 50 mL/hr at 02/16/21 0304   [MAR Hold] piperacillin-tazobactam (ZOSYN)  IV 3.375 g (02/16/21 0514)     LOS: 3 days    Time spent: 32 minutes    Barb Merino, MD [facility] Pager [phone]

## 2021-02-16 NOTE — Transfer of Care (Signed)
Immediate Anesthesia Transfer of Care Note  Patient: Derrick Lawrence  Procedure(s) Performed: LAPAROSCOPIC CHOLECYSTECTOMY  Patient Location: PACU  Anesthesia Type:General  Level of Consciousness: awake, alert  and oriented  Airway & Oxygen Therapy: Patient Spontanous Breathing and Patient connected to nasal cannula oxygen  Post-op Assessment: Report given to RN and Post -op Vital signs reviewed and stable  Post vital signs: Reviewed and stable  Last Vitals:  Vitals Value Taken Time  BP 150/71   Temp 97.6   Pulse 53 02/16/21 0957  Resp 19 02/16/21 0957  SpO2 97 % 02/16/21 0957  Vitals shown include unvalidated device data.  Last Pain:  Vitals:   02/16/21 0842  TempSrc: Oral  PainSc: 5       Patients Stated Pain Goal: 5 (71/16/57 9038)  Complications: No notable events documented.

## 2021-02-16 NOTE — Progress Notes (Signed)
Prior to induction, patient was found to be in bigeminy with 3 and 4 beat runs of V. tach.  He only received Zofran and a breathing treatment in the preop area.  Patient states he could feel a little heaviness in his chest while he was in bigeminy.  Surgery has been canceled.  He will be transferred to the stepdown unit.  A troponin has been ordered.  Dr. Sloan Leiter has been made aware. As the patient has minimal symptoms of cholecystitis, we may have to delay cholecystectomy and do it as an outpatient pending cardiology consultation.  Family has been made aware.

## 2021-02-16 NOTE — Anesthesia Preprocedure Evaluation (Signed)
Anesthesia Evaluation  Patient identified by MRN, date of birth, ID band Patient awake    Reviewed: Allergy & Precautions, NPO status , Patient's Chart, lab work & pertinent test results  History of Anesthesia Complications Negative for: history of anesthetic complications  Airway Mallampati: II  TM Distance: >3 FB Neck ROM: Full    Dental  (+) Dental Advisory Given, Missing   Pulmonary shortness of breath,     + wheezing      Cardiovascular hypertension, Pt. on medications  Rhythm:Regular Rate:Normal     Neuro/Psych CVA, Residual Symptoms negative psych ROS   GI/Hepatic negative GI ROS, Neg liver ROS,   Endo/Other  negative endocrine ROS  Renal/GU Renal InsufficiencyRenal disease ([place])     Musculoskeletal negative musculoskeletal ROS (+)   Abdominal   Peds  Hematology negative hematology ROS (+)   Anesthesia Other Findings Patient is not taking deep breaths because of pain. Wheezing on and off on the floor, as per his doctor he is responding to duoneb treatment.  Reproductive/Obstetrics negative OB ROS                            Anesthesia Physical Anesthesia Plan  ASA: 3  Anesthesia Plan: General   Post-op Pain Management: Dilaudid IV   Induction: Intravenous and Rapid sequence  PONV Risk Score and Plan: Ondansetron and Dexamethasone  Airway Management Planned: Oral ETT  Additional Equipment:   Intra-op Plan:   Post-operative Plan: Extubation in OR  Informed Consent: I have reviewed the patients History and Physical, chart, labs and discussed the procedure including the risks, benefits and alternatives for the proposed anesthesia with the patient or authorized representative who has indicated his/her understanding and acceptance.     Dental advisory given  Plan Discussed with: Surgeon  Anesthesia Plan Comments: (Will give duoneb treatment and proceed to the  procedure.)       Anesthesia Quick Evaluation

## 2021-02-16 NOTE — Anesthesia Postprocedure Evaluation (Signed)
Anesthesia Post Note  Patient: Derrick Lawrence  Procedure(s) Performed: LAPAROSCOPIC CHOLECYSTECTOMY  Patient location during evaluation: PACU Level of consciousness: awake and alert and oriented Pain management: pain level controlled Respiratory status: spontaneous breathing, patient connected to nasal cannula oxygen and respiratory function stable Cardiovascular status: blood pressure returned to baseline (multiple PVCs, bigeminy, procedure cancelled) Postop Assessment: no apparent nausea or vomiting Anesthetic complications: no   No notable events documented.   Last Vitals:  Vitals:   02/16/21 0959 02/16/21 1000  BP: (!) 150/71 (!) 150/71  Pulse: (!) 53 (!) 52  Resp: 20 15  Temp: 36.4 C   SpO2: 96% 97%    Last Pain:  Vitals:   02/16/21 0842  TempSrc: Oral  PainSc: 5                  Alphonsine Minium C Farooq Petrovich

## 2021-02-17 ENCOUNTER — Inpatient Hospital Stay (HOSPITAL_COMMUNITY): Payer: PPO

## 2021-02-17 DIAGNOSIS — R008 Other abnormalities of heart beat: Secondary | ICD-10-CM

## 2021-02-17 DIAGNOSIS — E7800 Pure hypercholesterolemia, unspecified: Secondary | ICD-10-CM

## 2021-02-17 DIAGNOSIS — R9431 Abnormal electrocardiogram [ECG] [EKG]: Secondary | ICD-10-CM

## 2021-02-17 LAB — COMPREHENSIVE METABOLIC PANEL
ALT: 62 U/L — ABNORMAL HIGH (ref 0–44)
AST: 67 U/L — ABNORMAL HIGH (ref 15–41)
Albumin: 2.4 g/dL — ABNORMAL LOW (ref 3.5–5.0)
Alkaline Phosphatase: 67 U/L (ref 38–126)
Anion gap: 8 (ref 5–15)
BUN: 19 mg/dL (ref 8–23)
CO2: 22 mmol/L (ref 22–32)
Calcium: 8.1 mg/dL — ABNORMAL LOW (ref 8.9–10.3)
Chloride: 106 mmol/L (ref 98–111)
Creatinine, Ser: 0.99 mg/dL (ref 0.61–1.24)
GFR, Estimated: >60 mL/min (ref >60)
Glucose, Bld: 111 mg/dL — ABNORMAL HIGH (ref 70–99)
Potassium: 3.6 mmol/L (ref 3.5–5.1)
Sodium: 136 mmol/L (ref 135–145)
Total Bilirubin: 0.6 mg/dL (ref 0.3–1.2)
Total Protein: 5.5 g/dL — ABNORMAL LOW (ref 6.5–8.1)

## 2021-02-17 LAB — ECHOCARDIOGRAM COMPLETE
Area-P 1/2: 3.65 cm2
Height: 68 in
S' Lateral: 2.6 cm
Weight: 3569.69 oz

## 2021-02-17 MED ORDER — OXYCODONE HCL 5 MG PO TABS: 5.0000 mg | ORAL_TABLET | Freq: Three times a day (TID) | ORAL | 0 refills | Status: DC | PRN

## 2021-02-17 MED ORDER — ATORVASTATIN CALCIUM 40 MG PO TABS: 40.0000 mg | ORAL_TABLET | Freq: Every day | ORAL | 11 refills | Status: DC

## 2021-02-17 MED ORDER — AMOXICILLIN-POT CLAVULANATE 875-125 MG PO TABS: 1.0000 | ORAL_TABLET | Freq: Two times a day (BID) | ORAL | 0 refills | Status: AC

## 2021-02-17 MED ORDER — LABETALOL HCL 5 MG/ML IV SOLN
10.0000 mg | Freq: Once | INTRAVENOUS | Status: AC
  Administered 2021-02-17: 07:00:00 10 mg via INTRAVENOUS
  Filled 2021-02-17: qty 4

## 2021-02-17 NOTE — Progress Notes (Signed)
*  PRELIMINARY RESULTS* Echocardiogram 2D Echocardiogram has been performed.  Derrick Lawrence 02/17/2021, 11:24 AM

## 2021-02-17 NOTE — Discharge Summary (Signed)
Physician Discharge Summary  Derrick Lawrence [facility] KLK:[id number] DOB: 1940-04-01 DOA: 02/13/2021  PCP: Christain Sacramento, MD  Admit date: 02/13/2021 Discharge date: 02/17/2021  Admitted From: Home Disposition: Home  Recommendations for Outpatient Follow-up:  Follow up with PCP in 1-2 weeks Please obtain BMP/CBC in one week Patient has been referred to [facility] health heart care cardiology for cardiac clearance He will follow-up with general surgery once cardiac clearance has been performed for cholecystectomy He should follow-up with neurosurgery, Dr. Marcello Moores for lumbar radiculopathy  Home Health: Equipment/Devices:  Discharge Condition: Stable CODE STATUS: Full code Diet recommendation: Heart healthy  Brief/Interim Summary: 80 year old male with a history of hypertension, hyperlipidemia, previous stroke, chronic low back pain, presents to the emergency room with abdominal pain and vomiting.  He was found to have acute cholecystitis on CT imaging along with acute kidney injury.  He was treated with intravenous antibiotics.  Plan was for laparoscopic cholecystectomy.  On 11/26, he was taken to the operating room, was noted to have transient bigeminy with 4 runs of V. tach prior to initiation of surgery.  Surgery was canceled due to risk of anesthesia.  Overall patient did report some improvement of symptoms and wishes to pursue cholecystectomy as an outpatient.  Discharge Diagnoses:  Principal Problem:   Acute cholecystitis Active Problems:   HTN (hypertension)   Hyperlipidemia   Hyponatremia   CVA (cerebral vascular accident) ([place])  Acute calculus cholecystitis: Some clinical improvement.  He was treated with intravenous Zosyn.  Subsequently transition to oral Augmentin.  Patient wishes to have surgery done on an elective basis as an outpatient.  He will pursue cardiology clearance as an outpatient.  Discussed with Dr. Arnoldo Morale and felt it was reasonable since he does not have any leukocytosis  and no symptomatic pain.  He will follow-up with surgery as an outpatient once he has been seen by cardiology.  Referral to outpatient cardiology has been made for cardiac clearance   Acute kidney injury/dehydration with hyponatremia: Treated with isotonic fluid resuscitation.  Renal functions are adequately normalized.  Urine output is adequate.  Continue to hold ACE inhibitor until follow-up with primary care physician  Lumbar radiculopathy, nerve root compression on MRI done as outpatient on 11/21. Discussed with neurosurgery.  Patient is planning to follow-up with Dr. Marcello Moores as outpatient.  History of stroke with right upper extremity hemiparesis: Stable.  No new neurological deficits.  Resume aspirin on discharge.  Continue to hold statin since he has had a minor bump in LFTs   Essential hypertension: Blood pressures were normal to low normal in the antihypertensives initially held.  These will be resumed on discharge with the exception of ACE inhibitor due to recovering renal function   Hypokalemia: Replaced.  Monitor level.  Magnesium is adequate.    Acute urinary retention: Secondary to debility, bedbound status and opiate use.  More than 900 mL straight cath.  Subsequently, patient does report is able to pass urine.   Nonsustained V. tach on preop evaluation: Patient with no known history of cardiac arrhythmias or coronary artery disease.  Currently without any symptoms. Echocardiogram performed showed normal ejection fraction. His nonsustained V. tach is probably benign or may be associated with the use of albuterol.  Will change to Xopenex. He will need to have cardiology clearance prior to any surgery. Outpatient referral to [facility] health heart care has been made.  Discharge Instructions  Discharge Instructions     Diet - low sodium heart healthy   Complete by: As directed  Increase activity slowly   Complete by: As directed       Allergies as of 02/17/2021   No Known  Allergies      Medication List     STOP taking these medications    diazepam 5 MG tablet Commonly known as: VALIUM   fosinopril 40 MG tablet Commonly known as: MONOPRIL   hydrochlorothiazide 25 MG tablet Commonly known as: HYDRODIURIL   meclizine 25 MG tablet Commonly known as: ANTIVERT   meloxicam 15 MG tablet Commonly known as: MOBIC   mupirocin ointment 2 % Commonly known as: BACTROBAN   quinapril 40 MG tablet Commonly known as: ACCUPRIL   triamcinolone lotion 0.1 % Commonly known as: KENALOG       TAKE these medications    amLODipine 5 MG tablet Commonly known as: NORVASC Take 5 mg by mouth daily.   amoxicillin-clavulanate 875-125 MG tablet Commonly known as: Augmentin Take 1 tablet by mouth 2 (two) times daily for 5 days.   aspirin 81 MG EC tablet Take by mouth. What changed: Another medication with the same name was removed. Continue taking this medication, and follow the directions you see here.   atorvastatin 40 MG tablet Commonly known as: Lipitor Take 1 tablet (40 mg total) by mouth daily.   B-12 2500 MCG Tabs Take by mouth.   clonazePAM 0.5 MG tablet Commonly known as: KLONOPIN Take one tablet twice a day as needed for anxiety   doxazosin 8 MG tablet Commonly known as: CARDURA Take 1 tablet (8 mg total) by mouth at bedtime. What changed: when to take this   fluticasone 50 MCG/ACT nasal spray Commonly known as: FLONASE Place 2 sprays into the nose daily. What changed: how to take this   gabapentin 100 MG capsule Commonly known as: NEURONTIN Take 200 mg by mouth 2 (two) times daily.   metoprolol succinate 100 MG 24 hr tablet Commonly known as: TOPROL-XL Take 100 mg by mouth daily.   oxyCODONE 5 MG immediate release tablet Commonly known as: Roxicodone Take 1 tablet (5 mg total) by mouth every 8 (eight) hours as needed.   sertraline 50 MG tablet Commonly known as: ZOLOFT Take 50 mg by mouth daily.   valACYclovir 1000 MG  tablet Commonly known as: VALTREX Take 1,000 mg by mouth See admin instructions. Take two tablets (2000 mg) by mouth at onset of cold sore, may take two tablets (2000 mg) 12 hours later if still needed.        Follow-up Information     Aviva Signs, MD. Call.   Specialty: General Surgery Why: Follow up after clearance by cardiology for gallbladder surgery Contact information: [address] [address] [place] will call you with follow appointment Follow up.          Christain Sacramento, MD. Schedule an appointment as soon as possible for a visit in 1 week(s).   Specialty: Family Medicine Contact information: [place] [address] [phone]                No Known Allergies  Consultations: General surgery   Procedures/Studies: MR LUMBAR SPINE WO CONTRAST  Result Date: 02/12/2021 CLINICAL DATA:  Lower back pain radiating down the right leg for 2 months EXAM: MRI LUMBAR SPINE WITHOUT CONTRAST TECHNIQUE: Multiplanar, multisequence MR imaging of the lumbar spine was performed. No intravenous contrast was administered. COMPARISON:  Lumbar myelogram 02/10/2001 (report only) FINDINGS:  Segmentation: Standard; the lowest formed disc space is designated L5-S1. Alignment: There is straightening of the normal lumbar spine lordosis. There is trace anterolisthesis of L3 on L4. Alignment is otherwise normal. Vertebrae: Vertebral body heights are preserved. Marrow signal is normal. Conus medullaris and cauda equina: Conus extends to the mid L1 level. Conus and cauda equina appear normal. Paraspinal and other soft tissues: Negative. Disc levels: There is marked disc desiccation and narrowing at L5-S1. There is mild desiccation without significant loss of height at the other levels. There is multilevel facet arthropathy, most advanced at L4-L5. There bilateral facet joint effusions at L2-L3 through L5-S1, most  notable at L4-L5 on the right. T12-L1: No significant spinal canal or neural foraminal stenosis. L1-L2: Mild bilateral facet arthropathy without significant spinal canal or neural foraminal stenosis. L2-L3: There is a mild disc bulge, ligamentum flavum thickening, and bilateral facet arthropathy resulting in moderate spinal canal stenosis with crowding of the cauda equina nerve roots and right subarticular zone and possible impingement of the traversing right L3 nerve root, without significant neural foraminal stenosis. L3-L4: There is a diffuse disc bulge with a superiorly migrated left subarticular zone extrusion measuring approximately 1.2 cm, ligamentum flavum thickening, and bilateral facet arthropathy resulting in severe spinal canal stenosis with compression of the cauda equina nerve roots and mild bilateral neural foraminal stenosis. L4-L5: There is a mild disc bulge with a small superiorly migrated right subarticular zone extrusion measuring approximately 6 mm, ligamentum flavum thickening, and advanced bilateral facet arthropathy resulting in mild spinal canal stenosis with crowding of the subarticular zones but no definite evidence of nerve root impingement, and no significant neural foraminal stenosis. L5-S1: There is a mild disc bulge, degenerative endplate change, and bilateral facet arthropathy resulting in mild-to-moderate bilateral neural foraminal stenosis without significant spinal canal stenosis. IMPRESSION: 1. Disc bulge with superiorly migrated left subarticular zone extrusion at L3-L4 resulting in severe spinal canal stenosis with compression of the traversing cauda equina nerve roots. 2. Mild disc bulge with small superiorly migrated right subarticular zone extrusion at L4-L5 resulting in mild spinal canal stenosis and crowding of the subarticular zones but no definite evidence of nerve root impingement. 3. Moderate spinal canal stenosis at L2-L3 with crowding of the cauda equina nerve roots  and right subarticular zone. There is possible impingement of the traversing right L3 nerve root. 4. Multilevel facet arthropathy with effusions, most notable at L4-L5 and worse on the right. 5. Trace anterolisthesis of L3 on L4, likely degenerative in nature. Electronically Signed   By: Valetta Mole M.D.   On: 02/12/2021 13:12   CT Abdomen Pelvis W Contrast  Result Date: 02/13/2021 CLINICAL DATA:  Abdominal pain EXAM: CT ABDOMEN AND PELVIS WITH CONTRAST TECHNIQUE: Multidetector CT imaging of the abdomen and pelvis was performed using the standard protocol following bolus administration of intravenous contrast. CONTRAST:  67mL OMNIPAQUE IOHEXOL 300 MG/ML  SOLN COMPARISON:  None. FINDINGS: Lower chest: No acute abnormality. Hepatobiliary: No suspicious liver lesions. Dilated gallbladder with surrounding inflammatory change and gallstones. No biliary ductal dilation. Pancreas: Unremarkable. No pancreatic ductal dilatation or surrounding inflammatory changes. Spleen: Normal in size without focal abnormality. Adrenals/Urinary Tract: Adrenal glands are unremarkable. No hydronephrosis or nephrolithiasis. Minimally complex cyst of the lower pole of the right kidney (Bosniak II). Simple cyst of the mid region of the left kidney. Stomach/Bowel: Stomach is within normal limits. Appendix is normal. No evidence of bowel wall thickening, distention, or inflammatory changes. CT Vascular/Lymphatic: Aortic atherosclerosis. No enlarged abdominal or  pelvic lymph nodes. Reproductive: Mild prostatomegaly. Other: No abdominal wall hernia or abnormality. No abdominopelvic ascites. Musculoskeletal: No acute or significant osseous findings. IMPRESSION: 1. Dilated gallbladder with surrounding inflammatory change and gallstones, findings are compatible with acute cholecystitis. 2.  Aortic Atherosclerosis (ICD10-I70.0). Electronically Signed   By: Yetta Glassman M.D.   On: 02/13/2021 13:19   DG CHEST PORT 1 VIEW  Result Date:  02/14/2021 CLINICAL DATA:  Shortness of breath and cough. EXAM: PORTABLE CHEST 1 VIEW COMPARISON:  01/16/2019 FINDINGS: The cardiomediastinal silhouette is unremarkable. Mild peribronchial thickening is noted. There is no evidence of focal airspace disease, pulmonary edema, suspicious pulmonary nodule/mass, pleural effusion, or pneumothorax. No acute bony abnormalities are identified. IMPRESSION: Mild peribronchial thickening without focal pneumonia. Electronically Signed   By: Margarette Canada M.D.   On: 02/14/2021 16:26   ECHOCARDIOGRAM COMPLETE  Result Date: 02/17/2021    ECHOCARDIOGRAM REPORT   Patient Name:   Derrick Lawrence [facility] Date of Exam: 02/17/2021 Medical Rec #:  [id number]        Height:       68.0 in Accession #:    [id number]       Weight:       223.1 lb Date of Birth:  October 28, 1940       BSA:          2.141 m Patient Age:    28 years         BP:           160/72 mmHg Patient Gender: M                HR:           73 bpm. Exam Location:  [facility] Procedure: 2D Echo, Cardiac Doppler and Color Doppler Indications:    Other abnormalities of the heart R00.8                 Abnormal ECG R94.31  History:        Patient has prior history of Echocardiogram examinations, most                 recent 01/14/2019. Stroke; Risk Factors:Hypertension and                 Dyslipidemia.  Sonographer:    Alvino Chapel RCS Referring Phys: [id number] [place]  1. Left ventricular ejection fraction, by estimation, is 60 to 65%. The left ventricle has normal function. The left ventricle has no regional wall motion abnormalities. There is mild left ventricular hypertrophy. Left ventricular diastolic parameters are consistent with Grade I diastolic dysfunction (impaired relaxation).  2. Right ventricular systolic function is normal. The right ventricular size is normal. Tricuspid regurgitation signal is inadequate for assessing PA pressure.  3. Left atrial size was mildly dilated.  4. The mitral valve is normal in  structure. Trivial mitral valve regurgitation. No evidence of mitral stenosis.  5. The aortic valve is tricuspid. Aortic valve regurgitation is not visualized. No aortic stenosis is present.  6. The inferior vena cava is dilated in size with >50% respiratory variability, suggesting right atrial pressure of 8 mmHg. FINDINGS  Left Ventricle: Left ventricular ejection fraction, by estimation, is 60 to 65%. The left ventricle has normal function. The left ventricle has no regional wall motion abnormalities. The left ventricular internal cavity size was normal in size. There is  mild left ventricular hypertrophy. Left ventricular diastolic parameters are consistent with Grade I diastolic dysfunction (impaired relaxation). Right Ventricle: The  right ventricular size is normal. No increase in right ventricular wall thickness. Right ventricular systolic function is normal. Tricuspid regurgitation signal is inadequate for assessing PA pressure. Left Atrium: Left atrial size was mildly dilated. Right Atrium: Right atrial size was normal in size. Pericardium: There is no evidence of pericardial effusion. Mitral Valve: The mitral valve is normal in structure. Trivial mitral valve regurgitation. No evidence of mitral valve stenosis. Tricuspid Valve: The tricuspid valve is normal in structure. Tricuspid valve regurgitation is trivial. No evidence of tricuspid stenosis. Aortic Valve: The aortic valve is tricuspid. Aortic valve regurgitation is not visualized. No aortic stenosis is present. Pulmonic Valve: The pulmonic valve was grossly normal. Pulmonic valve regurgitation is trivial. No evidence of pulmonic stenosis. Aorta: The aortic root is normal in size and structure. Venous: The inferior vena cava is dilated in size with greater than 50% respiratory variability, suggesting right atrial pressure of 8 mmHg. IAS/Shunts: No atrial level shunt detected by color flow Doppler.  LEFT VENTRICLE PLAX 2D LVIDd:         4.40 cm    Diastology LVIDs:         2.60 cm   LV e' medial:    7.51 cm/s LV PW:         1.30 cm   LV E/e' medial:  15.0 LV IVS:        1.20 cm   LV e' lateral:   7.83 cm/s LVOT diam:     2.10 cm   LV E/e' lateral: 14.4 LV SV:         76 LV SV Index:   35 LVOT Area:     3.46 cm  RIGHT VENTRICLE RV S prime:     12.20 cm/s TAPSE (M-mode): 2.5 cm LEFT ATRIUM             Index        RIGHT ATRIUM           Index LA diam:        3.80 cm 1.77 cm/m   RA Area:     14.90 cm LA Vol (A2C):   60.8 ml 28.40 ml/m  RA Volume:   35.70 ml  16.67 ml/m LA Vol (A4C):   74.2 ml 34.66 ml/m LA Biplane Vol: 72.3 ml 33.77 ml/m  AORTIC VALVE LVOT Vmax:   102.00 cm/s LVOT Vmean:  67.700 cm/s LVOT VTI:    0.219 m  AORTA Ao Root diam: 3.50 cm MITRAL VALVE MV Area (PHT): 3.65 cm     SHUNTS MV Decel Time: 208 msec     Systemic VTI:  0.22 m MV E velocity: 113.00 cm/s  Systemic Diam: 2.10 cm MV A velocity: 129.00 cm/s MV E/A ratio:  0.88 Cherlynn Kaiser MD Electronically signed by Cherlynn Kaiser MD Signature Date/Time: 02/17/2021/1:28:53 PM    Final       Subjective: Reports that abdominal pain is better.  No vomiting with oral intake.  He is frustrated about being in the hospital and wishes to discharge home today.  Discharge Exam: Vitals:   02/17/21 0732 02/17/21 0800 02/17/21 0900 02/17/21 1000  BP:  (!) 172/79 (!) 167/87 (!) 160/72  Pulse:  74 71 79  Resp:  17 11 16   Temp: 98.2 F (36.8 C)     TempSrc: Oral     SpO2:  92% 100% 93%  Weight:      Height:        General: Pt is alert, awake, not in acute  distress Cardiovascular: RRR, S1/S2 +, no rubs, no gallops Respiratory: CTA bilaterally, no wheezing, no rhonchi Abdominal: Soft, mild tenderness in right upper quadrant, ND, bowel sounds + Extremities: no edema, no cyanosis    The results of significant diagnostics from this hospitalization (including imaging, microbiology, ancillary and laboratory) are listed below for reference.     Microbiology: Recent Results  (from the past 240 hour(s))  Surgical PCR screen     Status: None   Collection Time: 02/15/21  2:31 PM   Specimen: Nasal Mucosa; Nasal Swab  Result Value Ref Range Status   MRSA, PCR NEGATIVE NEGATIVE Final   Staphylococcus aureus NEGATIVE NEGATIVE Final    Comment: (NOTE) The Xpert SA Assay (FDA approved for NASAL specimens in patients 40 years of age and older), is one component of a comprehensive surveillance program. It is not intended to diagnose infection nor to guide or monitor treatment. Performed at [facility], [address], [place], [place] [address]      Labs: BNP (last 3 results) No results for input(s): BNP in the last 8760 hours. Basic Metabolic Panel: Recent Labs  Lab 02/13/21 1120 02/14/21 0620 02/15/21 0605 02/16/21 0509 02/16/21 1201 02/17/21 0302  NA 129* 127* 130* 133*  --  136  K 3.5 3.7 3.2* 3.7  --  3.6  CL 92* 94* 97* 103  --  106  CO2 25 25 21* 23  --  22  GLUCOSE 117* 96 110* 116*  --  111*  BUN 27* 42* 45* 27*  --  19  CREATININE 1.75* 2.08* 1.73* 1.22  --  0.99  CALCIUM 8.5* 7.7* 7.6* 8.0*  --  8.1*  MG  --   --  1.7  --  1.9  --   PHOS  --   --   --   --  2.1*  --    Liver Function Tests: Recent Labs  Lab 02/13/21 1120 02/14/21 0620 02/15/21 0605 02/16/21 0509 02/17/21 0302  AST 18 17 21 31  67*  ALT 15 15 17 28  62*  ALKPHOS 72 62 67 64 67  BILITOT 1.3* 1.4* 1.3* 0.9 0.6  PROT 6.5 5.7* 5.8* 5.5* 5.5*  ALBUMIN 3.4* 2.8* 2.7* 2.5* 2.4*   Recent Labs  Lab 02/13/21 1120  LIPASE 28   No results for input(s): AMMONIA in the last 168 hours. CBC: Recent Labs  Lab 02/13/21 1120 02/14/21 0620 02/15/21 0605 02/16/21 0509  WBC 21.3* 18.1* 12.9* 10.3  NEUTROABS 18.5*  --  10.5* 8.4*  HGB 14.6 13.1 12.8* 11.6*  HCT 43.1 38.4* 38.4* 34.3*  MCV 90.7 90.6 90.1 90.7  PLT 144* 135* 143* 154   Cardiac Enzymes: No results for input(s): CKTOTAL, CKMB, CKMBINDEX, TROPONINI in the last 168 hours. BNP: Invalid input(s):  POCBNP CBG: No results for input(s): GLUCAP in the last 168 hours. D-Dimer No results for input(s): DDIMER in the last 72 hours. Hgb A1c No results for input(s): HGBA1C in the last 72 hours. Lipid Profile No results for input(s): CHOL, HDL, LDLCALC, TRIG, CHOLHDL, LDLDIRECT in the last 72 hours. Thyroid function studies No results for input(s): TSH, T4TOTAL, T3FREE, THYROIDAB in the last 72 hours.  Invalid input(s): FREET3 Anemia work up No results for input(s): VITAMINB12, FOLATE, FERRITIN, TIBC, IRON, RETICCTPCT in the last 72 hours. Urinalysis    Component Value Date/Time   COLORURINE YELLOW 02/13/2021 1725   APPEARANCEUR CLEAR 02/13/2021 1725   LABSPEC 1.015 02/13/2021 1725   PHURINE 6.0 02/13/2021 1725   GLUCOSEU  NEGATIVE 02/13/2021 1725   HGBUR NEGATIVE 02/13/2021 [place] 02/13/2021 [place] 02/13/2021 1725   PROTEINUR NEGATIVE 02/13/2021 1725   NITRITE NEGATIVE 02/13/2021 1725   LEUKOCYTESUR NEGATIVE 02/13/2021 1725   Sepsis Labs Invalid input(s): PROCALCITONIN,  WBC,  LACTICIDVEN Microbiology Recent Results (from the past 240 hour(s))  Surgical PCR screen     Status: None   Collection Time: 02/15/21  2:31 PM   Specimen: Nasal Mucosa; Nasal Swab  Result Value Ref Range Status   MRSA, PCR NEGATIVE NEGATIVE Final   Staphylococcus aureus NEGATIVE NEGATIVE Final    Comment: (NOTE) The Xpert SA Assay (FDA approved for NASAL specimens in patients 98 years of age and older), is one component of a comprehensive surveillance program. It is not intended to diagnose infection nor to guide or monitor treatment. Performed at [facility], [address], [place], [place] [address]      Time coordinating discharge: 47mins  SIGNED:   Kathie Dike, MD  [facility] 02/17/2021, 6:20 PM   If 7PM-7AM, please contact night-coverage [email or web address]

## 2021-02-17 NOTE — Progress Notes (Signed)
Discharged to home. IV access removed and Paperwork given to patient. Derrick Lawrence was also with patient during education. Taken to vehicle to be taken home by [employer].

## 2021-02-17 NOTE — Progress Notes (Signed)
Patient currently in normal sinus rhythm.  He would like to go home.  As he has minimal biliary colic and a normal white count this morning, I feel that this surgery could be done electively as an outpatient once he has had clearance by cardiology.  He has never seen a cardiologist before.  I told him that I need clearance from cardiology prior to any surgical intervention.  Once he has received this, he can call my office and I will schedule a laparoscopic cholecystectomy.  This was discussed with Dr. Roderic Palau.

## 2021-02-17 NOTE — Progress Notes (Signed)
Pharmacy Antibiotic Note  Derrick Lawrence a 80 y.o. male admitted on 02/17/2021 with acute cholecystitis.  Pharmacy has been consulted for zosyn dosing.  Plan: Continue Zosyn 3.375g IV q8h (4 hour infusion).  Medical History: Past Medical History:  Diagnosis Date   ED (erectile dysfunction)    H/O nonmelanoma skin cancer    Hyperlipidemia    Hypertension     Allergies:  No Known Allergies  Filed Weights   02/13/21 1106 02/13/21 1823  Weight: 102.5 kg (226 lb) 101.2 kg (223 lb 1.7 oz)    CBC Latest Ref Rng & Units 02/16/2021 02/15/2021 02/14/2021  WBC 4.0 - 10.5 K/uL 10.3 12.9(H) 18.1(H)  Hemoglobin 13.0 - 17.0 g/dL 11.6(L) 12.8(L) 13.1  Hematocrit 39.0 - 52.0 % 34.3(L) 38.4(L) 38.4(L)  Platelets 150 - 400 K/uL 154 143(L) 135(L)     Estimated Creatinine Clearance: 70.9 mL/min (by C-G formula based on SCr of 0.99 mg/dL).  Antibiotics Given (last 72 hours)     Date/Time Action Medication Dose Rate   02/14/21 1514 New Bag/Given   piperacillin-tazobactam (ZOSYN) IVPB 3.375 g 3.375 g 12.5 mL/hr   02/14/21 2139 New Bag/Given   piperacillin-tazobactam (ZOSYN) IVPB 3.375 g 3.375 g 12.5 mL/hr   02/15/21 0520 New Bag/Given   piperacillin-tazobactam (ZOSYN) IVPB 3.375 g 3.375 g 12.5 mL/hr   02/15/21 1408 New Bag/Given   piperacillin-tazobactam (ZOSYN) IVPB 3.375 g 3.375 g 12.5 mL/hr   02/15/21 2023 New Bag/Given   piperacillin-tazobactam (ZOSYN) IVPB 3.375 g 3.375 g 12.5 mL/hr   02/16/21 0514 New Bag/Given   piperacillin-tazobactam (ZOSYN) IVPB 3.375 g 3.375 g 12.5 mL/hr   02/16/21 1424 New Bag/Given   piperacillin-tazobactam (ZOSYN) IVPB 3.375 g 3.375 g 12.5 mL/hr   02/16/21 2104 New Bag/Given   piperacillin-tazobactam (ZOSYN) IVPB 3.375 g 3.375 g 12.5 mL/hr   02/17/21 0608 New Bag/Given   piperacillin-tazobactam (ZOSYN) IVPB 3.375 g 3.375 g 12.5 mL/hr       Antimicrobials this admission:  zosyn 02/13/2021  >>   Thank you for allowing pharmacy to be a part of this  patient's care.  Ramond Craver, PharmD Clinical Pharmacist

## 2021-02-19 DIAGNOSIS — I4729 Other ventricular tachycardia: Secondary | ICD-10-CM | POA: Insufficient documentation

## 2021-02-19 DIAGNOSIS — Z01810 Encounter for preprocedural cardiovascular examination: Secondary | ICD-10-CM | POA: Insufficient documentation

## 2021-02-19 NOTE — Progress Notes (Signed)
Cardiology Office Note   Date:  02/20/2021   ID:  Derrick, Lawrence 1940-08-31, MRN [id number]  PCP:  Derrick [place], MD  Cardiologist:   None Referring:   Derrick Signs, MD    Chief Complaint  Patient presents with   NSVT      History of Present Illness: Derrick Lawrence is a 80 y.o. male who is presenting for preop evaluation.     Echo this week demonstrated normal EF and no significant abnormalities.  He was in the hospital. I reviewed these records for this visit.    He was in with cholecystitis.  He had dehydration and hyponatremia.  He had hypokalemia as well.  He was noted to have NSVT.  He is sent for preop clearance prior to possible cholecystectomy.      He says that years ago he was told he had premature beats.  He has never been bothered by these other than feeling them occasionally.  He is a very physically active gentleman.  He can climb up a hill in his yard without any chest pain, neck or arm discomfort.  He does yard work without the symptoms.  He does not have any presyncope or syncope.  He denies any PND or orthopnea.  He has no past cardiac history.  Past Medical History:  Diagnosis Date   ED (erectile dysfunction)    H/O nonmelanoma skin cancer    Hyperlipidemia    Hypertension     Past Surgical History:  Procedure Laterality Date   CATARACT EXTRACTION Right    HERNIA REPAIR     Abdominal   SKIN CANCER EXCISION       Current Outpatient Medications  Medication Sig Dispense Refill   amLODipine (NORVASC) 5 MG tablet Take 5 mg by mouth daily.     amoxicillin-clavulanate (AUGMENTIN) 875-125 MG tablet Take 1 tablet by mouth 2 (two) times daily for 5 days. 10 tablet 0   aspirin 81 MG EC tablet Take by mouth.     atorvastatin (LIPITOR) 40 MG tablet Take 1 tablet (40 mg total) by mouth daily. 30 tablet 11   Cyanocobalamin (B-12) 2500 MCG TABS Take by mouth.     doxazosin (CARDURA) 8 MG tablet Take 1 tablet (8 mg total) by mouth at bedtime.  (Patient taking differently: Take 8 mg by mouth every morning.) 90 tablet 3   gabapentin (NEURONTIN) 100 MG capsule Take 200 mg by mouth 2 (two) times daily.     metoprolol succinate (TOPROL-XL) 100 MG 24 hr tablet Take 100 mg by mouth daily.     oxyCODONE (ROXICODONE) 5 MG immediate release tablet Take 1 tablet (5 mg total) by mouth every 8 (eight) hours as needed. 15 tablet 0   sertraline (ZOLOFT) 50 MG tablet Take 50 mg by mouth daily.     clonazePAM (KLONOPIN) 0.5 MG tablet Take one tablet twice a day as needed for anxiety (Patient not taking: Reported on 02/14/2021)     fluticasone (FLONASE) 50 MCG/ACT nasal spray Place 2 sprays into the nose daily. (Patient not taking: Reported on 02/20/2021) 1 g 11   valACYclovir (VALTREX) 1000 MG tablet Take 1,000 mg by mouth See admin instructions. Take two tablets (2000 mg) by mouth at onset of cold sore, may take two tablets (2000 mg) 12 hours later if still needed. (Patient not taking: Reported on 02/20/2021)     No current facility-administered medications for this visit.    Allergies:   Patient has no  known allergies.    Social History:  The patient  reports that he has never smoked. He has never used smokeless tobacco. He reports that he does not drink alcohol and does not use drugs.   Family History:  The patient's family history includes Cancer - Lung in his mother; Stroke in his son.    ROS:  Please see the history of present illness.   Otherwise, review of systems are positive for none.   All other systems are reviewed and negative.    PHYSICAL EXAM: VS:  BP 140/78   Pulse 68   Ht 5\' 8"  (1.727 m)   Wt 227 lb (103 kg)   BMI 34.52 kg/m  , BMI Body mass index is 34.52 kg/m. GENERAL:  Well appearing HEENT:  Pupils equal round and reactive, fundi not visualized, oral mucosa unremarkable NECK:  No jugular venous distention, waveform within normal limits, carotid upstroke brisk and symmetric, no bruits, no thyromegaly LYMPHATICS:  No  cervical, inguinal adenopathy LUNGS:  Clear to auscultation bilaterally BACK:  No CVA tenderness CHEST:  Unremarkable HEART:  PMI not displaced or sustained,S1 and S2 within normal limits, no S3, no S4, no clicks, no rubs, no murmurs ABD:  Flat, positive bowel sounds normal in frequency in pitch, no bruits, no rebound, no guarding,, mild abdominal tenderness no midline pulsatile mass, no hepatomegaly, no splenomegaly EXT:  2 plus pulses throughout, mild  edema, no cyanosis no clubbing SKIN: He has a mild abdominal rash from healing shingles NEURO:  Cranial nerves II through XII grossly intact, motor grossly intact throughout PSYCH:  Cognitively intact, oriented to person place and time    EKG:  EKG is not ordered today. The ekg ordered 02/16/2021 demonstrates sinus rhythm, premature ventricular contractions in a bigeminal pattern, axis within normal limits, intervals within normal limits, no acute ST-T wave changes.   Recent Labs: 02/16/2021: Hemoglobin 11.6; Magnesium 1.9; Platelets 154 02/17/2021: ALT 62; BUN 19; Creatinine, Ser 0.99; Potassium 3.6; Sodium 136    Lipid Panel    Component Value Date/Time   CHOL 144 01/15/2019 0256   TRIG 123 01/15/2019 0256   HDL 33 (L) 01/15/2019 0256   CHOLHDL 4.4 01/15/2019 0256   VLDL 25 01/15/2019 0256   LDLCALC 86 01/15/2019 0256      Wt Readings from Last 3 Encounters:  02/20/21 227 lb (103 kg)  02/13/21 223 lb 1.7 oz (101.2 kg)  09/07/19 233 lb (105.7 kg)      Other studies Reviewed: Additional studies/ records that were reviewed today include: Hospital records. Review of the above records demonstrates:  Please see elsewhere in the note.     ASSESSMENT AND PLAN:  NSVT: He has had premature beats on his description for years.  He is not bothered by these.  He has a structurally normal heart on echo and no symptoms.  I will defer to his primary provider if there is not a recent TSH I do not have a low suspicion for thyroid  disorder.  Electrolytes were recently corrected.  No further work-up is suggested.  PREOP: The patient is going for a low risk procedure.  He has a very high functional level.  He has no unstable symptoms or findings.  Therefore, according to ACC/AHA guidelines no change in therapy is indicated and no further testing is indicated.  He is at acceptable risk for the planned procedure.  Current medicines are reviewed at length with the patient today.  The patient does not have concerns regarding  medicines.  The following changes have been made:  no change  Labs/ tests ordered today include: None No orders of the defined types were placed in this encounter.    Disposition:   FU with me as needed   Signed, Minus Breeding, MD  02/20/2021 10:09 AM    [place]

## 2021-02-20 ENCOUNTER — Ambulatory Visit (INDEPENDENT_AMBULATORY_CARE_PROVIDER_SITE_OTHER): Payer: PPO | Admitting: Cardiology

## 2021-02-20 ENCOUNTER — Other Ambulatory Visit: Payer: Self-pay

## 2021-02-20 ENCOUNTER — Encounter: Payer: Self-pay | Admitting: Cardiology

## 2021-02-20 VITALS — BP 140/78 | HR 68 | Ht 68.0 in | Wt 227.0 lb

## 2021-02-20 DIAGNOSIS — Z01810 Encounter for preprocedural cardiovascular examination: Secondary | ICD-10-CM | POA: Diagnosis not present

## 2021-02-20 DIAGNOSIS — I4729 Other ventricular tachycardia: Secondary | ICD-10-CM | POA: Diagnosis not present

## 2021-02-20 NOTE — Patient Instructions (Signed)
Medication Instructions:  The current medical regimen is effective;  continue present plan and medications.  *If you need a refill on your cardiac medications before your next appointment, please call your pharmacy*  Follow-Up: At [facility], you and your health needs are our priority.  As part of our continuing mission to provide you with exceptional heart care, we have created designated Provider Care Teams.  These Care Teams include your primary Cardiologist (physician) and Advanced Practice Providers (APPs -  Physician Assistants and Nurse Practitioners) who all work together to provide you with the care you need, when you need it.  We recommend signing up for the patient portal called "MyChart".  Sign up information is provided on this After Visit Summary.  MyChart is used to connect with patients for Virtual Visits (Telemedicine).  Patients are able to view lab/test results, encounter notes, upcoming appointments, etc.  Non-urgent messages can be sent to your provider as well.   To learn more about what you can do with MyChart, go to [email or web address].    Your next appointment:   FOLLOW UP AS NEEDED.  Thank you for choosing [facility]!!

## 2021-02-22 NOTE — H&P (Signed)
Derrick Lawrence is an 80 y.o. male.   Chief Complaint: Cholelithiasis, history of cholecystitis HPI: Patient is a 80 year old white male who was recently admitted for acute cholecystitis secondary to cholelithiasis.  He was about to undergo a cholecystectomy but went into bigeminy prior to induction.  The surgery was canceled.  The patient was seen by a cardiologist.  Patient has been cleared by cardiology to proceed with laparoscopic cholecystectomy. Past Medical History:  Diagnosis Date   ED (erectile dysfunction)    H/O nonmelanoma skin cancer    Hyperlipidemia    Hypertension     Past Surgical History:  Procedure Laterality Date   CATARACT EXTRACTION Right    HERNIA REPAIR     Abdominal   SKIN CANCER EXCISION      Family History  Problem Relation Age of Onset   Cancer - Lung Mother    Stroke Son    Social History:  reports that he has never smoked. He has never used smokeless tobacco. He reports that he does not drink alcohol and does not use drugs.  Allergies: No Known Allergies  No medications prior to admission.    No results found for this or any previous visit (from the past 48 hour(s)). No results found.  Review of Systems  Constitutional: Negative.   HENT: Negative.    Eyes: Negative.   Respiratory: Negative.    Cardiovascular: Negative.   Gastrointestinal:  Positive for abdominal pain.  Endocrine: Negative.   Genitourinary: Negative.   Musculoskeletal:  Positive for back pain.  Allergic/Immunologic: Negative.   Neurological: Negative.   Hematological: Negative.   Psychiatric/Behavioral: Negative.     There were no vitals taken for this visit. Physical Exam Vitals reviewed.  Constitutional:      Appearance: Normal appearance. He is not ill-appearing.  HENT:     Head: Normocephalic and atraumatic.  Cardiovascular:     Rate and Rhythm: Normal rate and regular rhythm.     Heart sounds: Normal heart sounds. No murmur heard.   No friction rub. No  gallop.  Pulmonary:     Effort: Pulmonary effort is normal. No respiratory distress.     Breath sounds: Normal breath sounds. No stridor. No wheezing, rhonchi or rales.  Abdominal:     General: Bowel sounds are normal. There is no distension.     Palpations: Abdomen is soft. There is no mass.     Tenderness: There is no abdominal tenderness. There is no guarding or rebound.     Hernia: No hernia is present.  Skin:    General: Skin is warm and dry.  Neurological:     Mental Status: He is alert and oriented to person, place, and time.   2D echo within normal limits. Assessment/Plan Impression: Cholelithiasis, history of cholecystitis.  History of CVA, hypertension.  Episode of bigeminy which has since resolved. Plan: Patient will undergo laparoscopic cholecystectomy on 02/27/2021.  The risks and benefits of the procedure including bleeding, infection, hepatobiliary injury, and the possibility of an open procedure were fully explained to the patient, who gave informed consent. Aviva Signs, MD 02/22/2021, 11:42 AM

## 2021-02-25 ENCOUNTER — Other Ambulatory Visit (INDEPENDENT_AMBULATORY_CARE_PROVIDER_SITE_OTHER): Payer: PPO | Admitting: General Surgery

## 2021-02-25 ENCOUNTER — Encounter (HOSPITAL_COMMUNITY): Payer: Self-pay

## 2021-02-25 ENCOUNTER — Telehealth: Payer: Self-pay | Admitting: *Deleted

## 2021-02-25 ENCOUNTER — Other Ambulatory Visit: Payer: Self-pay

## 2021-02-25 ENCOUNTER — Encounter (HOSPITAL_COMMUNITY)
Admission: RE | Admit: 2021-02-25 | Discharge: 2021-02-25 | Disposition: A | Discharge status: Home / Self Care | Payer: PPO | Source: Ambulatory Visit | Attending: General Surgery | Admitting: General Surgery

## 2021-02-25 DIAGNOSIS — Z09 Encounter for follow-up examination after completed treatment for conditions other than malignant neoplasm: Secondary | ICD-10-CM

## 2021-02-25 HISTORY — DX: Cerebral infarction, unspecified: I63.9

## 2021-02-25 MED ORDER — OXYCODONE HCL 5 MG PO TABS: 5.0000 mg | ORAL_TABLET | Freq: Three times a day (TID) | ORAL | 0 refills | Status: DC | PRN

## 2021-02-25 NOTE — Progress Notes (Signed)
Reordered pain medication due to biliary colic.

## 2021-02-25 NOTE — Telephone Encounter (Signed)
Received call from patient daughter Wannetta Sender.   Reports that patient had surgery re-scheduled due to cardiac function. States that surgery is now scheduled for Wednesday for lap chole.   Requested refill on pain medication, Oxycodone 5mg  to be sent to [facility].   Provider made aware.

## 2021-02-27 ENCOUNTER — Ambulatory Visit (HOSPITAL_COMMUNITY): Payer: PPO | Admitting: Anesthesiology

## 2021-02-27 ENCOUNTER — Encounter (HOSPITAL_COMMUNITY)
Admission: RE | Disposition: A | Discharge status: Home / Self Care | Payer: Self-pay | Source: Home / Self Care | Attending: General Surgery

## 2021-02-27 ENCOUNTER — Ambulatory Visit (HOSPITAL_BASED_OUTPATIENT_CLINIC_OR_DEPARTMENT_OTHER)
Admission: RE | Admit: 2021-02-27 | Discharge: 2021-02-27 | Disposition: A | Discharge status: Home / Self Care | Payer: PPO | Source: Home / Self Care | Attending: General Surgery | Admitting: General Surgery

## 2021-02-27 ENCOUNTER — Encounter (HOSPITAL_COMMUNITY): Payer: Self-pay | Admitting: General Surgery

## 2021-02-27 DIAGNOSIS — K82A1 Gangrene of gallbladder in cholecystitis: Secondary | ICD-10-CM | POA: Diagnosis not present

## 2021-02-27 DIAGNOSIS — D649 Anemia, unspecified: Secondary | ICD-10-CM | POA: Diagnosis not present

## 2021-02-27 DIAGNOSIS — J309 Allergic rhinitis, unspecified: Secondary | ICD-10-CM | POA: Diagnosis not present

## 2021-02-27 DIAGNOSIS — Z8673 Personal history of transient ischemic attack (TIA), and cerebral infarction without residual deficits: Secondary | ICD-10-CM | POA: Insufficient documentation

## 2021-02-27 DIAGNOSIS — E785 Hyperlipidemia, unspecified: Secondary | ICD-10-CM | POA: Diagnosis not present

## 2021-02-27 DIAGNOSIS — R55 Syncope and collapse: Secondary | ICD-10-CM | POA: Diagnosis not present

## 2021-02-27 DIAGNOSIS — R10819 Abdominal tenderness, unspecified site: Secondary | ICD-10-CM | POA: Diagnosis not present

## 2021-02-27 DIAGNOSIS — E871 Hypo-osmolality and hyponatremia: Secondary | ICD-10-CM | POA: Diagnosis not present

## 2021-02-27 DIAGNOSIS — N400 Enlarged prostate without lower urinary tract symptoms: Secondary | ICD-10-CM | POA: Diagnosis not present

## 2021-02-27 DIAGNOSIS — E8809 Other disorders of plasma-protein metabolism, not elsewhere classified: Secondary | ICD-10-CM | POA: Diagnosis not present

## 2021-02-27 DIAGNOSIS — R140 Abdominal distension (gaseous): Secondary | ICD-10-CM | POA: Diagnosis not present

## 2021-02-27 DIAGNOSIS — K8012 Calculus of gallbladder with acute and chronic cholecystitis without obstruction: Secondary | ICD-10-CM | POA: Insufficient documentation

## 2021-02-27 DIAGNOSIS — I10 Essential (primary) hypertension: Secondary | ICD-10-CM | POA: Insufficient documentation

## 2021-02-27 DIAGNOSIS — K810 Acute cholecystitis: Secondary | ICD-10-CM

## 2021-02-27 DIAGNOSIS — I700 Atherosclerosis of aorta: Secondary | ICD-10-CM | POA: Diagnosis not present

## 2021-02-27 DIAGNOSIS — Z20822 Contact with and (suspected) exposure to covid-19: Secondary | ICD-10-CM | POA: Diagnosis not present

## 2021-02-27 DIAGNOSIS — I951 Orthostatic hypotension: Secondary | ICD-10-CM | POA: Diagnosis not present

## 2021-02-27 HISTORY — PX: CHOLECYSTECTOMY: SHX55

## 2021-02-27 SURGERY — LAPAROSCOPIC CHOLECYSTECTOMY
Anesthesia: General | Site: Abdomen

## 2021-02-27 MED ORDER — DEXAMETHASONE SODIUM PHOSPHATE 10 MG/ML IJ SOLN
INTRAMUSCULAR | Status: DC | PRN
  Administered 2021-02-27: 10 mg via INTRAVENOUS

## 2021-02-27 MED ORDER — ONDANSETRON HCL 4 MG/2ML IJ SOLN
INTRAMUSCULAR | Status: AC
  Filled 2021-02-27: qty 2

## 2021-02-27 MED ORDER — HYDROMORPHONE HCL 1 MG/ML IJ SOLN
0.2500 mg | INTRAMUSCULAR | Status: DC | PRN
  Administered 2021-02-27 (×3): 0.5 mg via INTRAVENOUS
  Filled 2021-02-27 (×3): qty 0.5

## 2021-02-27 MED ORDER — DEXAMETHASONE SODIUM PHOSPHATE 10 MG/ML IJ SOLN
INTRAMUSCULAR | Status: AC
  Filled 2021-02-27: qty 1

## 2021-02-27 MED ORDER — PROPOFOL 10 MG/ML IV BOLUS
INTRAVENOUS | Status: AC
  Filled 2021-02-27: qty 20

## 2021-02-27 MED ORDER — SUCCINYLCHOLINE CHLORIDE 200 MG/10ML IV SOSY
PREFILLED_SYRINGE | INTRAVENOUS | Status: DC | PRN
  Administered 2021-02-27: 120 mg via INTRAVENOUS

## 2021-02-27 MED ORDER — CHLORHEXIDINE GLUCONATE CLOTH 2 % EX PADS: 6.0000 | MEDICATED_PAD | Freq: Once | CUTANEOUS | Status: DC

## 2021-02-27 MED ORDER — LEVALBUTEROL HCL 0.63 MG/3ML IN NEBU: 0.6300 mg | INHALATION_SOLUTION | Freq: Once | RESPIRATORY_TRACT | Status: DC | PRN

## 2021-02-27 MED ORDER — ROCURONIUM BROMIDE 10 MG/ML (PF) SYRINGE
PREFILLED_SYRINGE | INTRAVENOUS | Status: AC
  Filled 2021-02-27: qty 10

## 2021-02-27 MED ORDER — LACTATED RINGERS IV SOLN: INTRAVENOUS | Status: DC

## 2021-02-27 MED ORDER — SUGAMMADEX SODIUM 500 MG/5ML IV SOLN
INTRAVENOUS | Status: DC | PRN
  Administered 2021-02-27: 200 mg via INTRAVENOUS

## 2021-02-27 MED ORDER — GLYCOPYRROLATE PF 0.2 MG/ML IJ SOSY
PREFILLED_SYRINGE | INTRAMUSCULAR | Status: AC
  Filled 2021-02-27: qty 1

## 2021-02-27 MED ORDER — EPHEDRINE 5 MG/ML INJ
INTRAVENOUS | Status: AC
  Filled 2021-02-27: qty 5

## 2021-02-27 MED ORDER — CEFAZOLIN SODIUM-DEXTROSE 2-4 GM/100ML-% IV SOLN
2.0000 g | INTRAVENOUS | Status: AC
  Administered 2021-02-27: 2 g via INTRAVENOUS

## 2021-02-27 MED ORDER — PHENYLEPHRINE HCL (PRESSORS) 10 MG/ML IV SOLN
INTRAVENOUS | Status: DC | PRN
  Administered 2021-02-27 (×3): 80 ug via INTRAVENOUS

## 2021-02-27 MED ORDER — BUPIVACAINE LIPOSOME 1.3 % IJ SUSP
INTRAMUSCULAR | Status: AC
  Filled 2021-02-27: qty 20

## 2021-02-27 MED ORDER — HEMOSTATIC AGENTS (NO CHARGE) OPTIME
TOPICAL | Status: DC | PRN
  Administered 2021-02-27 (×2): 1

## 2021-02-27 MED ORDER — OXYCODONE HCL 5 MG PO TABS: 5.0000 mg | ORAL_TABLET | Freq: Four times a day (QID) | ORAL | 0 refills | Status: DC | PRN

## 2021-02-27 MED ORDER — LIDOCAINE HCL (PF) 2 % IJ SOLN
INTRAMUSCULAR | Status: AC
  Filled 2021-02-27: qty 10

## 2021-02-27 MED ORDER — GLYCOPYRROLATE 0.2 MG/ML IJ SOLN
INTRAMUSCULAR | Status: DC | PRN
  Administered 2021-02-27: .1 mg via INTRAVENOUS

## 2021-02-27 MED ORDER — ORAL CARE MOUTH RINSE: 15.0000 mL | Freq: Once | OROMUCOSAL | Status: AC

## 2021-02-27 MED ORDER — CHLORHEXIDINE GLUCONATE 0.12 % MT SOLN
15.0000 mL | Freq: Once | OROMUCOSAL | Status: AC
  Administered 2021-02-27: 15 mL via OROMUCOSAL

## 2021-02-27 MED ORDER — KETOROLAC TROMETHAMINE 30 MG/ML IJ SOLN
15.0000 mg | Freq: Once | INTRAMUSCULAR | Status: AC
  Administered 2021-02-27: 15 mg via INTRAVENOUS
  Filled 2021-02-27: qty 1

## 2021-02-27 MED ORDER — LIDOCAINE HCL (CARDIAC) PF 100 MG/5ML IV SOSY
PREFILLED_SYRINGE | INTRAVENOUS | Status: DC | PRN
  Administered 2021-02-27: 60 mg via INTRATRACHEAL

## 2021-02-27 MED ORDER — BUPIVACAINE LIPOSOME 1.3 % IJ SUSP
INTRAMUSCULAR | Status: DC | PRN
  Administered 2021-02-27: 20 mL

## 2021-02-27 MED ORDER — SUCCINYLCHOLINE CHLORIDE 200 MG/10ML IV SOSY
PREFILLED_SYRINGE | INTRAVENOUS | Status: AC
  Filled 2021-02-27: qty 10

## 2021-02-27 MED ORDER — PHENYLEPHRINE 40 MCG/ML (10ML) SYRINGE FOR IV PUSH (FOR BLOOD PRESSURE SUPPORT)
PREFILLED_SYRINGE | INTRAVENOUS | Status: AC
  Filled 2021-02-27: qty 10

## 2021-02-27 MED ORDER — PROPOFOL 10 MG/ML IV BOLUS
INTRAVENOUS | Status: DC | PRN
  Administered 2021-02-27: 150 mg via INTRAVENOUS

## 2021-02-27 MED ORDER — ONDANSETRON HCL 4 MG/2ML IJ SOLN
INTRAMUSCULAR | Status: DC | PRN
  Administered 2021-02-27: 4 mg via INTRAVENOUS

## 2021-02-27 MED ORDER — MIDAZOLAM HCL 2 MG/2ML IJ SOLN
INTRAMUSCULAR | Status: AC
  Filled 2021-02-27: qty 2

## 2021-02-27 MED ORDER — MIDAZOLAM HCL 2 MG/2ML IJ SOLN
2.0000 mg | Freq: Once | INTRAMUSCULAR | Status: AC
  Administered 2021-02-27: 2 mg via INTRAVENOUS

## 2021-02-27 MED ORDER — CEFAZOLIN SODIUM-DEXTROSE 2-4 GM/100ML-% IV SOLN
INTRAVENOUS | Status: AC
  Filled 2021-02-27: qty 100

## 2021-02-27 MED ORDER — FENTANYL CITRATE (PF) 250 MCG/5ML IJ SOLN
INTRAMUSCULAR | Status: AC
  Filled 2021-02-27: qty 5

## 2021-02-27 MED ORDER — FENTANYL CITRATE (PF) 100 MCG/2ML IJ SOLN
INTRAMUSCULAR | Status: DC | PRN
  Administered 2021-02-27: 50 ug via INTRAVENOUS
  Administered 2021-02-27: 100 ug via INTRAVENOUS

## 2021-02-27 MED ORDER — ONDANSETRON HCL 4 MG/2ML IJ SOLN: 4.0000 mg | Freq: Once | INTRAMUSCULAR | Status: DC | PRN

## 2021-02-27 MED ORDER — ROCURONIUM BROMIDE 10 MG/ML (PF) SYRINGE
PREFILLED_SYRINGE | INTRAVENOUS | Status: DC | PRN
  Administered 2021-02-27: 10 mg via INTRAVENOUS
  Administered 2021-02-27: 40 mg via INTRAVENOUS

## 2021-02-27 MED ORDER — SODIUM CHLORIDE 0.9 % IR SOLN
Status: DC | PRN
  Administered 2021-02-27: 3000 mL
  Administered 2021-02-27: 1000 mL

## 2021-02-27 MED ORDER — EPHEDRINE SULFATE 50 MG/ML IJ SOLN
INTRAMUSCULAR | Status: DC | PRN
  Administered 2021-02-27 (×2): 5 mg via INTRAVENOUS
  Administered 2021-02-27: 10 mg via INTRAVENOUS

## 2021-02-27 SURGICAL SUPPLY — 49 items
ADH SKN CLS APL DERMABOND .7 (GAUZE/BANDAGES/DRESSINGS) ×1
APL ESCP 73.6OZ SRGCL (TIP) ×1
APL PRP STRL LF DISP 70% ISPRP (MISCELLANEOUS) ×1
APPLIER CLIP ROT 10 11.4 M/L (STAPLE) ×2
APR CLP MED LRG 11.4X10 (STAPLE) ×1
BAG RETRIEVAL 10 (BASKET) ×1
CHLORAPREP W/TINT 26 (MISCELLANEOUS) ×2 IMPLANT
CLIP APPLIE ROT 10 11.4 M/L (STAPLE) ×1 IMPLANT
CLOTH BEACON ORANGE TIMEOUT ST (SAFETY) ×2 IMPLANT
COVER LIGHT HANDLE STERIS (MISCELLANEOUS) ×4 IMPLANT
CUTTER FLEX LINEAR 45M (STAPLE) ×2 IMPLANT
DERMABOND ADVANCED (GAUZE/BANDAGES/DRESSINGS) ×1
DERMABOND ADVANCED .7 DNX12 (GAUZE/BANDAGES/DRESSINGS) ×1 IMPLANT
DISSECTOR BLUNT TIP ENDO 5MM (MISCELLANEOUS) ×2 IMPLANT
ELECT REM PT RETURN 9FT ADLT (ELECTROSURGICAL) ×2
ELECTRODE REM PT RTRN 9FT ADLT (ELECTROSURGICAL) ×1 IMPLANT
GAUZE 4X4 16PLY ~~LOC~~+RFID DBL (SPONGE) ×2 IMPLANT
GLOVE SURG LTX SZ6.5 (GLOVE) ×2 IMPLANT
GLOVE SURG POLYISO LF SZ7.5 (GLOVE) ×2 IMPLANT
GLOVE SURG UNDER POLY LF SZ6.5 (GLOVE) ×2 IMPLANT
GLOVE SURG UNDER POLY LF SZ7 (GLOVE) ×4 IMPLANT
GOWN STRL REUS W/TWL LRG LVL3 (GOWN DISPOSABLE) ×6 IMPLANT
HEMOSTAT SNOW SURGICEL 2X4 (HEMOSTASIS) ×2 IMPLANT
INST SET LAPROSCOPIC AP (KITS) ×2 IMPLANT
IV NS IRRIG 3000ML ARTHROMATIC (IV SOLUTION) ×2 IMPLANT
KIT TURNOVER KIT A (KITS) ×2 IMPLANT
MANIFOLD NEPTUNE II (INSTRUMENTS) ×2 IMPLANT
NEEDLE HYPO 18GX1.5 BLUNT FILL (NEEDLE) ×2 IMPLANT
NEEDLE HYPO 21X1.5 SAFETY (NEEDLE) ×2 IMPLANT
NEEDLE INSUFFLATION 14GA 120MM (NEEDLE) ×2 IMPLANT
NS IRRIG 1000ML POUR BTL (IV SOLUTION) ×2 IMPLANT
PACK LAP CHOLE LZT030E (CUSTOM PROCEDURE TRAY) ×2 IMPLANT
PAD ARMBOARD 7.5X6 YLW CONV (MISCELLANEOUS) ×2 IMPLANT
RELOAD 45 VASCULAR/THIN (ENDOMECHANICALS) ×2 IMPLANT
SET BASIN LINEN APH (SET/KITS/TRAYS/PACK) ×2 IMPLANT
SET TUBE IRRIG SUCTION NO TIP (IRRIGATION / IRRIGATOR) ×2 IMPLANT
SET TUBE SMOKE EVAC HIGH FLOW (TUBING) ×2 IMPLANT
SLEEVE ENDOPATH XCEL 5M (ENDOMECHANICALS) ×2 IMPLANT
SUT MNCRL AB 4-0 PS2 18 (SUTURE) ×4 IMPLANT
SUT VICRYL 0 UR6 27IN ABS (SUTURE) ×2 IMPLANT
SYR 20ML LL LF (SYRINGE) ×4 IMPLANT
SYS BAG RETRIEVAL 10MM (BASKET) ×1
SYSTEM BAG RETRIEVAL 10MM (BASKET) ×1 IMPLANT
TIP ENDOSCOPIC SURGICEL (TIP) ×2 IMPLANT
TROCAR ENDO BLADELESS 11MM (ENDOMECHANICALS) ×2 IMPLANT
TROCAR XCEL NON-BLD 5MMX100MML (ENDOMECHANICALS) ×2 IMPLANT
TROCAR XCEL UNIV SLVE 11M 100M (ENDOMECHANICALS) ×2 IMPLANT
TUBE CONNECTING 12X1/4 (SUCTIONS) ×2 IMPLANT
WARMER LAPAROSCOPE (MISCELLANEOUS) ×2 IMPLANT

## 2021-02-27 NOTE — Anesthesia Preprocedure Evaluation (Addendum)
Anesthesia Evaluation  Patient identified by MRN, date of birth, ID band Patient awake    Reviewed: Allergy & Precautions, NPO status , Patient's Chart, lab work & pertinent test results  Airway Mallampati: II  TM Distance: >3 FB Neck ROM: Full    Dental  (+) Dental Advisory Given, Missing   Pulmonary neg pulmonary ROS,    Pulmonary exam normal breath sounds clear to auscultation       Cardiovascular Exercise Tolerance: Good hypertension, Pt. on medications Normal cardiovascular exam Rhythm:Regular Rate:Normal  1. Left ventricular ejection fraction, by estimation, is 60 to 65%. The left ventricle has normal function. The left ventricle has no regional wall motion abnormalities. There is mild left ventricular hypertrophy.  Left ventricular diastolic parameters  are consistent with Grade I diastolic dysfunction (impaired relaxation).  2. Right ventricular systolic function is normal. The right ventricular size is normal. Tricuspid regurgitation signal is inadequate for assessing PA pressure.  3. Left atrial size was mildly dilated.  4. The mitral valve is normal in structure. Trivial mitral valve regurgitation. No evidence of mitral stenosis.  5. The aortic valve is tricuspid. Aortic valve regurgitation is not visualized. No aortic stenosis is present.  6. The inferior vena cava is dilated in size with >50% respiratory variability, suggesting right atrial pressure of 8 mmHg.    Neuro/Psych CVA, Residual Symptoms    GI/Hepatic negative GI ROS, Neg liver ROS,   Endo/Other  negative endocrine ROS  Renal/GU negative Renal ROS     Musculoskeletal negative musculoskeletal ROS (+)   Abdominal   Peds  Hematology negative hematology ROS (+)   Anesthesia Other Findings   Reproductive/Obstetrics negative OB ROS                             Anesthesia Physical Anesthesia Plan  ASA: 3  Anesthesia  Plan: General   Post-op Pain Management: Dilaudid IV   Induction: Intravenous  PONV Risk Score and Plan: 4 or greater and Ondansetron and Dexamethasone  Airway Management Planned: Oral ETT  Additional Equipment:   Intra-op Plan:   Post-operative Plan: Extubation in OR  Informed Consent: I have reviewed the patients History and Physical, chart, labs and discussed the procedure including the risks, benefits and alternatives for the proposed anesthesia with the patient or authorized representative who has indicated his/her understanding and acceptance.     Dental advisory given  Plan Discussed with: Surgeon  Anesthesia Plan Comments:         Anesthesia Quick Evaluation

## 2021-02-27 NOTE — Interval H&P Note (Signed)
History and Physical Interval Note:  02/27/2021 7:57 AM  Derrick Lawrence  has presented today for surgery, with the diagnosis of Acute cholecystitis.  The various methods of treatment have been discussed with the patient and family. After consideration of risks, benefits and other options for treatment, the patient has consented to  Procedure(s): LAPAROSCOPIC CHOLECYSTECTOMY (N/A) as a surgical intervention.  The patient's history has been reviewed, patient examined, no change in status, stable for surgery.  I have reviewed the patient's chart and labs.  Questions were answered to the patient's satisfaction.     Aviva Signs

## 2021-02-27 NOTE — Transfer of Care (Signed)
Immediate Anesthesia Transfer of Care Note  Patient: Derrick Lawrence  Procedure(s) Performed: LAPAROSCOPIC CHOLECYSTECTOMY (Abdomen)  Patient Location: PACU  Anesthesia Type:General  Level of Consciousness: drowsy  Airway & Oxygen Therapy: Patient Spontanous Breathing and Patient connected to nasal cannula oxygen  Post-op Assessment: Report given to RN and Post -op Vital signs reviewed and stable  Post vital signs: Reviewed and stable  Last Vitals:  Vitals Value Taken Time  BP 137/73 02/27/21 1032  Temp    Pulse 62 02/27/21 1035  Resp 15 02/27/21 1035  SpO2 90 % 02/27/21 1035  Vitals shown include unvalidated device data.  Last Pain:  Vitals:   02/27/21 0751  TempSrc: Oral  PainSc: 0-No pain         Complications: No notable events documented.

## 2021-02-27 NOTE — Op Note (Signed)
Patient:  Derrick Lawrence [facility]  DOB:  Nov 13, 1940  MRN:  [id number]   Preop Diagnosis: Cholecystitis, cholelithiasis  Postop Diagnosis: Same, empyema of gallbladder  Procedure: Laparoscopic cholecystectomy  Surgeon: Aviva Signs, MD  Assistant:  Quincy Simmonds, DO  Anes: General endotracheal  Indications: Patient is a 80 year old white male who was recently discharged with a hospital with acute cholecystitis secondary cholelithiasis.  He had to have cardiology clearance due to bigeminy.  He was cleared by cardiology.  The risks and benefits of the procedure including bleeding, infection, hepatobiliary injury, the possibility of an open procedure were fully explained to the patient, who gave informed consent.  Procedure note: The patient was placed in the supine position.  After induction of general endotracheal anesthesia, the abdomen was prepped and draped using the usual sterile technique with ChloraPrep.  Surgical site confirmation was performed.  A supraumbilical incision was made down to the fascia.  A Veress needle was introduced into the abdominal cavity and confirmation of placement was done using the saline drop test.  The abdomen was then insufflated to 15 mm rectory pressure.  An 11 mm trocar was introduced into the abdominal cavity under direct visualization without difficulty.  The patient was placed in reverse Trendelenburg position and an additional 11 mm trocar was placed in the epigastric region and 5 mm trochars were placed in the right upper quadrant and right flank regions.  The liver was inspected noted to be within normal limits.  The gallbladder was encased in omentum which had to be bluntly removed.  The gallbladder wall was noted to be somewhat gangrenous.  An empyema of the gallbladder was found.  The dissection was taken around the infundibulum of the gallbladder.  The cystic duct was first identified.  Its juncture to the infundibulum was fully identified.  Given  the findings on the critical view, it was elected to proceed with using an Endo GIA across the infundibulum.  A vascular Endo GIA was placed across the infundibulum and fired.  There was a apparent cystic artery higher up and this was ligated using a clip.  The gallbladder was then freed away from the gallbladder fossa using Bovie electrocautery.  The gallbladder was delivered through the epigastric trocar site using an Endo Catch bag.  The gallbladder fossa was irrigated.  Surgicel powder and snow were placed in the gallbladder fossa.  All fluid and air were then evacuated from the abdominal cavity prior to removal of the trochars.  All wounds were irrigated with normal saline.  All wounds were injected with Exparel.  The supraumbilical fascia as well as epigastric fascia were reapproximated using 0 Vicryl interrupted sutures.  All skin incisions were closed using a 4-0 Monocryl subcuticular suture.  Dermabond was applied.  All tape and needle counts were correct at the end of the procedure.  The patient was extubated in the operating room and transferred to PACU in stable condition.  Complications: None  EBL: Minimal  Specimen: Gallbladder

## 2021-02-27 NOTE — Anesthesia Procedure Notes (Signed)
Procedure Name: Intubation Date/Time: 02/27/2021 9:01 AM Performed by: Karna Dupes, CRNA Pre-anesthesia Checklist: Patient identified, Emergency Drugs available, Suction available and Patient being monitored Patient Re-evaluated:Patient Re-evaluated prior to induction Oxygen Delivery Method: Circle system utilized Preoxygenation: Pre-oxygenation with 100% oxygen Induction Type: IV induction Ventilation: Mask ventilation without difficulty Laryngoscope Size: Mac and 4 Grade View: Grade II Tube type: Oral Tube size: 7.5 mm Number of attempts: 1 Airway Equipment and Method: Stylet Placement Confirmation: ETT inserted through vocal cords under direct vision, positive ETCO2 and breath sounds checked- equal and bilateral Secured at: 22 cm Tube secured with: Tape Dental Injury: Teeth and Oropharynx as per pre-operative assessment

## 2021-02-27 NOTE — Anesthesia Postprocedure Evaluation (Signed)
Anesthesia Post Note  Patient: Derrick Lawrence  Procedure(s) Performed: LAPAROSCOPIC CHOLECYSTECTOMY (Abdomen)  Patient location during evaluation: Phase II Anesthesia Type: General Level of consciousness: awake and alert and oriented Pain management: pain level controlled Vital Signs Assessment: post-procedure vital signs reviewed and stable Respiratory status: spontaneous breathing, nonlabored ventilation and respiratory function stable Cardiovascular status: blood pressure returned to baseline and stable Postop Assessment: no apparent nausea or vomiting Anesthetic complications: no   No notable events documented.   Last Vitals:  Vitals:   02/27/21 1202 02/27/21 1226  BP: 128/69 123/85  Pulse: 65 67  Resp: (!) 8   Temp:  (!) 36.4 C  SpO2: 97% 97%    Last Pain:  Vitals:   02/27/21 1226  TempSrc: Oral  PainSc: 6                  Altie Savard C Mekayla Soman

## 2021-02-28 LAB — SURGICAL PATHOLOGY

## 2021-03-01 ENCOUNTER — Encounter (HOSPITAL_COMMUNITY): Payer: Self-pay | Admitting: General Surgery

## 2021-03-02 ENCOUNTER — Inpatient Hospital Stay (HOSPITAL_COMMUNITY)
Admission: EM | Admit: 2021-03-02 | Discharge: 2021-03-04 | DRG: 418 | Disposition: A | Discharge status: Home / Self Care | Payer: PPO | Attending: Family Medicine | Admitting: Family Medicine

## 2021-03-02 ENCOUNTER — Emergency Department (HOSPITAL_COMMUNITY): Payer: PPO

## 2021-03-02 ENCOUNTER — Encounter (HOSPITAL_COMMUNITY): Payer: Self-pay | Admitting: Emergency Medicine

## 2021-03-02 DIAGNOSIS — R61 Generalized hyperhidrosis: Secondary | ICD-10-CM | POA: Diagnosis not present

## 2021-03-02 DIAGNOSIS — R55 Syncope and collapse: Secondary | ICD-10-CM | POA: Diagnosis not present

## 2021-03-02 DIAGNOSIS — Z85828 Personal history of other malignant neoplasm of skin: Secondary | ICD-10-CM | POA: Diagnosis not present

## 2021-03-02 DIAGNOSIS — E876 Hypokalemia: Secondary | ICD-10-CM | POA: Diagnosis not present

## 2021-03-02 DIAGNOSIS — E871 Hypo-osmolality and hyponatremia: Secondary | ICD-10-CM | POA: Diagnosis not present

## 2021-03-02 DIAGNOSIS — E8809 Other disorders of plasma-protein metabolism, not elsewhere classified: Secondary | ICD-10-CM | POA: Diagnosis present

## 2021-03-02 DIAGNOSIS — D649 Anemia, unspecified: Secondary | ICD-10-CM | POA: Diagnosis not present

## 2021-03-02 DIAGNOSIS — R109 Unspecified abdominal pain: Secondary | ICD-10-CM | POA: Diagnosis not present

## 2021-03-02 DIAGNOSIS — Z20822 Contact with and (suspected) exposure to covid-19: Secondary | ICD-10-CM | POA: Diagnosis present

## 2021-03-02 DIAGNOSIS — I700 Atherosclerosis of aorta: Secondary | ICD-10-CM | POA: Diagnosis not present

## 2021-03-02 DIAGNOSIS — F419 Anxiety disorder, unspecified: Secondary | ICD-10-CM | POA: Diagnosis present

## 2021-03-02 DIAGNOSIS — R531 Weakness: Secondary | ICD-10-CM | POA: Diagnosis not present

## 2021-03-02 DIAGNOSIS — R10819 Abdominal tenderness, unspecified site: Secondary | ICD-10-CM | POA: Diagnosis not present

## 2021-03-02 DIAGNOSIS — Z79899 Other long term (current) drug therapy: Secondary | ICD-10-CM | POA: Diagnosis not present

## 2021-03-02 DIAGNOSIS — E785 Hyperlipidemia, unspecified: Secondary | ICD-10-CM | POA: Diagnosis present

## 2021-03-02 DIAGNOSIS — E46 Unspecified protein-calorie malnutrition: Secondary | ICD-10-CM

## 2021-03-02 DIAGNOSIS — N281 Cyst of kidney, acquired: Secondary | ICD-10-CM | POA: Diagnosis not present

## 2021-03-02 DIAGNOSIS — J309 Allergic rhinitis, unspecified: Secondary | ICD-10-CM | POA: Diagnosis not present

## 2021-03-02 DIAGNOSIS — R140 Abdominal distension (gaseous): Secondary | ICD-10-CM | POA: Diagnosis not present

## 2021-03-02 DIAGNOSIS — R0902 Hypoxemia: Secondary | ICD-10-CM | POA: Diagnosis not present

## 2021-03-02 DIAGNOSIS — E7800 Pure hypercholesterolemia, unspecified: Secondary | ICD-10-CM | POA: Diagnosis not present

## 2021-03-02 DIAGNOSIS — Z7982 Long term (current) use of aspirin: Secondary | ICD-10-CM

## 2021-03-02 DIAGNOSIS — I951 Orthostatic hypotension: Secondary | ICD-10-CM | POA: Diagnosis not present

## 2021-03-02 DIAGNOSIS — W19XXXA Unspecified fall, initial encounter: Secondary | ICD-10-CM | POA: Diagnosis present

## 2021-03-02 DIAGNOSIS — K82A1 Gangrene of gallbladder in cholecystitis: Secondary | ICD-10-CM | POA: Diagnosis not present

## 2021-03-02 DIAGNOSIS — N400 Enlarged prostate without lower urinary tract symptoms: Secondary | ICD-10-CM | POA: Diagnosis not present

## 2021-03-02 DIAGNOSIS — K7689 Other specified diseases of liver: Secondary | ICD-10-CM | POA: Diagnosis not present

## 2021-03-02 DIAGNOSIS — K8012 Calculus of gallbladder with acute and chronic cholecystitis without obstruction: Principal | ICD-10-CM | POA: Diagnosis present

## 2021-03-02 DIAGNOSIS — E440 Moderate protein-calorie malnutrition: Secondary | ICD-10-CM | POA: Diagnosis not present

## 2021-03-02 DIAGNOSIS — I959 Hypotension, unspecified: Secondary | ICD-10-CM | POA: Diagnosis not present

## 2021-03-02 DIAGNOSIS — Z8673 Personal history of transient ischemic attack (TIA), and cerebral infarction without residual deficits: Secondary | ICD-10-CM

## 2021-03-02 DIAGNOSIS — K5730 Diverticulosis of large intestine without perforation or abscess without bleeding: Secondary | ICD-10-CM | POA: Diagnosis not present

## 2021-03-02 DIAGNOSIS — I10 Essential (primary) hypertension: Secondary | ICD-10-CM | POA: Diagnosis not present

## 2021-03-02 LAB — COMPREHENSIVE METABOLIC PANEL
ALT: 46 U/L — ABNORMAL HIGH (ref 0–44)
AST: 30 U/L (ref 15–41)
Albumin: 2.6 g/dL — ABNORMAL LOW (ref 3.5–5.0)
Alkaline Phosphatase: 77 U/L (ref 38–126)
Anion gap: 7 (ref 5–15)
BUN: 17 mg/dL (ref 8–23)
CO2: 25 mmol/L (ref 22–32)
Calcium: 7.9 mg/dL — ABNORMAL LOW (ref 8.9–10.3)
Chloride: 98 mmol/L (ref 98–111)
Creatinine, Ser: 1.05 mg/dL (ref 0.61–1.24)
GFR, Estimated: >60 mL/min (ref >60)
Glucose, Bld: 121 mg/dL — ABNORMAL HIGH (ref 70–99)
Potassium: 3.9 mmol/L (ref 3.5–5.1)
Sodium: 130 mmol/L — ABNORMAL LOW (ref 135–145)
Total Bilirubin: 1.1 mg/dL (ref 0.3–1.2)
Total Protein: 5.6 g/dL — ABNORMAL LOW (ref 6.5–8.1)

## 2021-03-02 LAB — CBC
HCT: 36 % — ABNORMAL LOW (ref 39.0–52.0)
Hemoglobin: 12.3 g/dL — ABNORMAL LOW (ref 13.0–17.0)
MCH: 31 pg (ref 26.0–34.0)
MCHC: 34.2 g/dL (ref 30.0–36.0)
MCV: 90.7 fL (ref 80.0–100.0)
Platelets: 281 10*3/uL (ref 150–400)
RBC: 3.97 MIL/uL — ABNORMAL LOW (ref 4.22–5.81)
RDW: 13.3 % (ref 11.5–15.5)
WBC: 13.7 10*3/uL — ABNORMAL HIGH (ref 4.0–10.5)
nRBC: 0 % (ref 0.0–0.2)

## 2021-03-02 LAB — LIPASE, BLOOD: Lipase: 21 U/L (ref 11–51)

## 2021-03-02 LAB — CBG MONITORING, ED: Glucose-Capillary: 120 mg/dL — ABNORMAL HIGH (ref 70–99)

## 2021-03-02 LAB — TROPONIN I (HIGH SENSITIVITY): Troponin I (High Sensitivity): 6 ng/L (ref <18)

## 2021-03-02 MED ORDER — MORPHINE SULFATE (PF) 2 MG/ML IV SOLN
2.0000 mg | INTRAVENOUS | Status: DC | PRN
  Administered 2021-03-03: 2 mg via INTRAVENOUS
  Filled 2021-03-02: qty 1

## 2021-03-02 NOTE — ED Triage Notes (Signed)
Pt arrives from home via [facility] for c/o weakness and not feeling good today. Pt recently admitted for cholelithiasis.

## 2021-03-02 NOTE — ED Provider Notes (Signed)
[facility] EMERGENCY DEPARTMENT Provider Note   CSN: [id number] Arrival date & time: 03/02/21  2037     History Chief Complaint  Patient presents with   Weakness    Derrick Lawrence is a 80 y.o. adult.   Patient with medical history including hyperlipidemia, hypertension, CVA presents with a syncopal episode.  Patient states today he got up from his chair went to use the restroom and feels as if he blacked out, he remembers leaning onto the wall and falling down onto his butt.  He denies hitting his head and is not on anticoagulant.  He does not remember why he fell, he does not endorse feeling lightheaded, dizziness, chest pain, shortness of breath or unilateral weakness.  He states that he was unable to get himself up off the floor, and needed assistance from his brother-in-law, he states after he felt slightly off balance on his feet but has no other symptoms, he does not endorse headaches, change in vision, paresthesias or weakness upper lower extremities.  He denies any current chest pain or shortness of breath, stomach pains, nausea vomiting or diarrhea.  Patient never had this past, he denies any alleviating or aggravating factors, states that he has been tolerating p.o. but has had little to no bowel movements and very little flatus.  He states that his stomach has become  more distended.    Patient recently had cholecystectomy performed by Dr. Arnoldo Morale on 1207 and was later discharged home, he is supposed to have this performed on 11/23 but prior to surgery but he had a bout of V. Tach and surgery was postponed.  he follow-up with his cardiologist who performed a stress test and echo which was unremarkable and cleared him for surgery.  Past Medical History:  Diagnosis Date   ED (erectile dysfunction)    H/O nonmelanoma skin cancer    Hyperlipidemia    Hypertension    Stroke [facility])    left sided weakness- minimal;    Patient Active Problem List   Diagnosis Date Noted    Syncope 03/02/2021   Empyema of gallbladder    NSVT (nonsustained ventricular tachycardia) 02/19/2021   Preop cardiovascular exam 02/19/2021   Acute cholecystitis 02/13/2021   Acute CVA (cerebrovascular accident) ([place]) 01/14/2019   CVA (cerebral vascular accident) ([place]) 01/14/2019   Hypokalemia    Hyponatremia    HTN (hypertension) 07/03/2010   ED (erectile dysfunction) 07/03/2010   Hyperlipidemia 07/03/2010   Allergic rhinitis 07/03/2010   Diverticula of colon 07/03/2010   Hemorrhoids, internal, without mention of complications 70/62/3762    Past Surgical History:  Procedure Laterality Date   CATARACT EXTRACTION Right    CHOLECYSTECTOMY N/A 02/27/2021   Procedure: LAPAROSCOPIC CHOLECYSTECTOMY;  Surgeon: Aviva Signs, MD;  Location: [facility];  Service: General;  Laterality: N/A;   HERNIA REPAIR     Abdominal   SKIN CANCER EXCISION       OB History   No obstetric history on file.     Family History  Problem Relation Age of Onset   Cancer - Lung Mother    Stroke Son     Social History   Tobacco Use   Smoking status: Never   Smokeless tobacco: Never  Vaping Use   Vaping Use: Never used  Substance Use Topics   Alcohol use: No   Drug use: No    Home Medications Prior to Admission medications   Medication Sig Start Date End Date Taking? Authorizing Provider  amLODipine (NORVASC) 5 MG tablet  Take 5 mg by mouth daily. 05/06/19   [provider]  aspirin 81 MG EC tablet Take 81 mg by mouth daily.    [provider]  atorvastatin (LIPITOR) 40 MG tablet Take 1 tablet (40 mg total) by mouth daily. 02/17/21 02/17/22  Kathie Dike, MD  clonazePAM (KLONOPIN) 0.5 MG tablet  06/20/20   [provider]  Cyanocobalamin (B-12) 2500 MCG TABS Take 2,500 mcg by mouth daily.    [provider]  doxazosin (CARDURA) 8 MG tablet Take 1 tablet (8 mg total) by mouth at bedtime. Patient taking differently: Take 8 mg by mouth every morning. 10/26/12    Lysbeth Penner, FNP  fluticasone (FLONASE) 50 MCG/ACT nasal spray Place 2 sprays into the nose daily. 10/12/12   Lysbeth Penner, FNP  gabapentin (NEURONTIN) 100 MG capsule Take 200 mg by mouth 2 (two) times daily. 12/10/20   [provider]  metoprolol succinate (TOPROL-XL) 100 MG 24 hr tablet Take 100 mg by mouth daily. 10/19/18   [provider]  oxyCODONE (ROXICODONE) 5 MG immediate release tablet Take 1 tablet (5 mg total) by mouth every 6 (six) hours as needed. 02/27/21 02/27/22  Aviva Signs, MD  sertraline (ZOLOFT) 50 MG tablet Take 50 mg by mouth daily. 11/21/20   [provider]  valACYclovir (VALTREX) 1000 MG tablet Take 1,000 mg by mouth See admin instructions. Take two tablets (2000 mg) by mouth at onset of cold sore, may take two tablets (2000 mg) 12 hours later if still needed. Patient not taking: Reported on 02/20/2021    [provider]    Allergies    Patient has no known allergies.  Review of Systems   Review of Systems  Constitutional:  Negative for chills and fever.  HENT:  Negative for congestion.   Respiratory:  Negative for shortness of breath.   Cardiovascular:  Negative for chest pain.  Gastrointestinal:  Positive for abdominal distention and constipation. Negative for abdominal pain, nausea and vomiting.  Genitourinary:  Negative for enuresis.  Musculoskeletal:  Negative for back pain.  Skin:  Negative for rash.  Neurological:  Positive for syncope. Negative for dizziness.  Hematological:  Does not bruise/bleed easily.   Physical Exam Updated Vital Signs BP (!) 151/88   Pulse 69   Temp 97.7 F (36.5 C) (Oral)   Resp 19   Ht 5\' 8"  (1.727 m)   Wt 103 kg   SpO2 96%   BMI 34.52 kg/m   Physical Exam Vitals and nursing note reviewed.  Constitutional:      General: She is not in acute distress.    Appearance: She is not ill-appearing.  HENT:     Head: Normocephalic and atraumatic.     Nose: No congestion.      Mouth/Throat:     Mouth: Mucous membranes are moist.     Pharynx: Oropharynx is clear. No oropharyngeal exudate or posterior oropharyngeal erythema.  Eyes:     Extraocular Movements: Extraocular movements intact.     Conjunctiva/sclera: Conjunctivae normal.  Cardiovascular:     Rate and Rhythm: Normal rate and regular rhythm.     Pulses: Normal pulses.     Heart sounds: No murmur heard.   No friction rub. No gallop.  Pulmonary:     Effort: No respiratory distress.     Breath sounds: No wheezing, rhonchi or rales.  Abdominal:     General: There is distension.     Palpations: Abdomen is soft.  Tenderness: There is abdominal tenderness. There is no right CVA tenderness or left CVA tenderness.     Comments: Abdomen distended, tympanic to percussion, normoactive bowel sounds, he is tender to palpation in his right upper quadrant, there is no guarding, rebound tenderness, peritoneal sign, he has prior surgical incisions which showed no signs of infection.  No CVA tenderness present.  Musculoskeletal:     Cervical back: No tenderness.  Skin:    General: Skin is warm and dry.  Neurological:     Mental Status: She is alert.     GCS: GCS eye subscore is 4. GCS verbal subscore is 5. GCS motor subscore is 6.     Cranial Nerves: Cranial nerves 2-12 are intact. No cranial nerve deficit.     Sensory: Sensation is intact.     Motor: No weakness.     Coordination: Romberg sign negative. Finger-Nose-Finger Test normal.     Comments: Cranial nerves II through XII grossly intact, no difficult word finding, no slurring of his words, able follow two-step commands, no unilateral weakness present.  Psychiatric:        Mood and Affect: Mood normal.    ED Results / Procedures / Treatments   Labs (all labs ordered are listed, but only abnormal results are displayed) Labs Reviewed  CBC - Abnormal; Notable for the following components:      Result Value   WBC 13.7 (*)    RBC 3.97 (*)    Hemoglobin  12.3 (*)    HCT 36.0 (*)    All other components within normal limits  COMPREHENSIVE METABOLIC PANEL - Abnormal; Notable for the following components:   Sodium 130 (*)    Glucose, Bld 121 (*)    Calcium 7.9 (*)    Total Protein 5.6 (*)    Albumin 2.6 (*)    ALT 46 (*)    All other components within normal limits  CBG MONITORING, ED - Abnormal; Notable for the following components:   Glucose-Capillary 120 (*)    All other components within normal limits  RESP PANEL BY RT-PCR (FLU A&B, COVID) ARPGX2  LIPASE, BLOOD  URINALYSIS, ROUTINE W REFLEX MICROSCOPIC  TROPONIN I (HIGH SENSITIVITY)    EKG None  Radiology DG Abdomen 1 View  Result Date: 03/02/2021 CLINICAL DATA:  Abdominal tenderness, bloating EXAM: ABDOMEN - 1 VIEW COMPARISON:  CT 02/13/2021 FINDINGS: Gas throughout nondistended large and small bowel. No organomegaly or free air. No suspicious calcification. IMPRESSION: No acute findings. Electronically Signed   By: Rolm Baptise M.D.   On: 03/02/2021 23:03    Procedures Procedures   Medications Ordered in ED Medications - No data to display  ED Course  I have reviewed the triage vital signs and the nursing notes.  Pertinent labs & imaging results that were available during my care of the patient were reviewed by me and considered in my medical decision making (see chart for details).    MDM Rules/Calculators/A&P                          Initial impression-presents with syncopal episode, he is alert, no acute distress, vital signs reassuring.  Unclear etiology will obtain basic lab work-up, abdomen is concern for obstruction versus intra-abdominal infection will obtain imaging and reassess.  Work-up-CBC shows leukocytosis of 13.7, normocytic anemia hemoglobin 12.3, CMP shows sodium of 130, glucose 121, calcium 7.9, AST 46.  X-ray reveals no acute abnormalities, gas throughout nondistended large and  small bowels.  Positive orthostatics.  Reassessment-unfortunately we  do not have CT scan at this time, I am concerned for possible intra-abdominal infection due to his tenderness and imaging for rule out.  I am also concerned for the syncopal episode with history of V. tach concern for possible arrhythmias, we do not have cardiology at this time. At this point  I recommend admission for further evaluation abdominal distention as well as syncopal episode and she be transferred out.  Patient agreed this plan will consult hospitalist team for admission.  Consult -spoke with Dr. Nori Riis who will admit the patient.  Rule out- low suspicion for CVA or intracranial head bleed as patient is not on anticoagulant, denies any his head, denies change in vision, paresthesias or weakness to upper lower extremities will defer imaging at this time.   Low suspicion for ACS as patient denies chest pain, shortness of breath,  EKG sinus without signs of ischemia for troponin is 6 will defer on second troponin as he has been chest pain-free for greater than 12 hours, would expect elevation if if ACS was present. low suspicion for lower lobe pneumonia as lung sounds are clear bilaterally, will defer imaging at this time.   Low suspicion for pancreatitis as lipase is within normal limits.  Low suspicion for ruptured stomach ulcer as she has no peritoneal sign present on exam.  Low suspicion for appendicitis as she has no right lower quadrant tenderness, vital signs reassuring.     Plan-admission likely transfer  Syncopal episode-unclear etiology possible from hypotension orthostatics versus possible arrhythmia will need further work-up. Stomach distention-concern for possible ileus versus obstruction versus intra-abdominal infection will need CT scanning for further evaluation.  Final Clinical Impression(s) / ED Diagnoses Final diagnoses:  Syncope, unspecified syncope type    Rx / DC Orders ED Discharge Orders     None        Aron Baba 03/02/21 2336    Davonna Belling, MD 03/03/21 0006

## 2021-03-03 ENCOUNTER — Inpatient Hospital Stay (HOSPITAL_COMMUNITY): Payer: PPO

## 2021-03-03 ENCOUNTER — Other Ambulatory Visit: Payer: Self-pay

## 2021-03-03 DIAGNOSIS — R55 Syncope and collapse: Secondary | ICD-10-CM | POA: Diagnosis not present

## 2021-03-03 DIAGNOSIS — E7800 Pure hypercholesterolemia, unspecified: Secondary | ICD-10-CM | POA: Diagnosis not present

## 2021-03-03 DIAGNOSIS — I10 Essential (primary) hypertension: Secondary | ICD-10-CM

## 2021-03-03 DIAGNOSIS — R109 Unspecified abdominal pain: Secondary | ICD-10-CM

## 2021-03-03 DIAGNOSIS — E46 Unspecified protein-calorie malnutrition: Secondary | ICD-10-CM

## 2021-03-03 DIAGNOSIS — E440 Moderate protein-calorie malnutrition: Secondary | ICD-10-CM

## 2021-03-03 LAB — CBC WITH DIFFERENTIAL/PLATELET
Abs Immature Granulocytes: 0.1 10*3/uL — ABNORMAL HIGH (ref 0.00–0.07)
Basophils Absolute: 0.1 10*3/uL (ref 0.0–0.1)
Basophils Relative: 1 %
Eosinophils Absolute: 0 10*3/uL (ref 0.0–0.5)
Eosinophils Relative: 0 %
HCT: 35 % — ABNORMAL LOW (ref 39.0–52.0)
Hemoglobin: 11.7 g/dL — ABNORMAL LOW (ref 13.0–17.0)
Immature Granulocytes: 1 %
Lymphocytes Relative: 15 %
Lymphs Abs: 1.7 10*3/uL (ref 0.7–4.0)
MCH: 30 pg (ref 26.0–34.0)
MCHC: 33.4 g/dL (ref 30.0–36.0)
MCV: 89.7 fL (ref 80.0–100.0)
Monocytes Absolute: 0.8 10*3/uL (ref 0.1–1.0)
Monocytes Relative: 7 %
Neutro Abs: 8.6 10*3/uL — ABNORMAL HIGH (ref 1.7–7.7)
Neutrophils Relative %: 76 %
Platelets: 267 10*3/uL (ref 150–400)
RBC: 3.9 MIL/uL — ABNORMAL LOW (ref 4.22–5.81)
RDW: 13.1 % (ref 11.5–15.5)
WBC: 11.3 10*3/uL — ABNORMAL HIGH (ref 4.0–10.5)
nRBC: 0 % (ref 0.0–0.2)

## 2021-03-03 LAB — PROTIME-INR
INR: 1.2 (ref 0.8–1.2)
Prothrombin Time: 15.3 seconds — ABNORMAL HIGH (ref 11.4–15.2)

## 2021-03-03 LAB — COMPREHENSIVE METABOLIC PANEL
ALT: 40 U/L (ref 0–44)
AST: 25 U/L (ref 15–41)
Albumin: 2.5 g/dL — ABNORMAL LOW (ref 3.5–5.0)
Alkaline Phosphatase: 73 U/L (ref 38–126)
Anion gap: 7 (ref 5–15)
BUN: 18 mg/dL (ref 8–23)
CO2: 24 mmol/L (ref 22–32)
Calcium: 7.9 mg/dL — ABNORMAL LOW (ref 8.9–10.3)
Chloride: 100 mmol/L (ref 98–111)
Creatinine, Ser: 0.91 mg/dL (ref 0.61–1.24)
GFR, Estimated: >60 mL/min (ref >60)
Glucose, Bld: 105 mg/dL — ABNORMAL HIGH (ref 70–99)
Potassium: 3.2 mmol/L — ABNORMAL LOW (ref 3.5–5.1)
Sodium: 131 mmol/L — ABNORMAL LOW (ref 135–145)
Total Bilirubin: 1 mg/dL (ref 0.3–1.2)
Total Protein: 5.5 g/dL — ABNORMAL LOW (ref 6.5–8.1)

## 2021-03-03 LAB — RESP PANEL BY RT-PCR (FLU A&B, COVID) ARPGX2
Influenza A by PCR: NEGATIVE
Influenza B by PCR: NEGATIVE
SARS Coronavirus 2 by RT PCR: NEGATIVE

## 2021-03-03 LAB — MAGNESIUM: Magnesium: 1.7 mg/dL (ref 1.7–2.4)

## 2021-03-03 MED ORDER — ASPIRIN EC 81 MG PO TBEC
81.0000 mg | DELAYED_RELEASE_TABLET | Freq: Every day | ORAL | Status: DC
  Administered 2021-03-03 – 2021-03-04 (×2): 81 mg via ORAL
  Filled 2021-03-03 (×2): qty 1

## 2021-03-03 MED ORDER — PIPERACILLIN-TAZOBACTAM 3.375 G IVPB
3.3750 g | Freq: Three times a day (TID) | INTRAVENOUS | Status: DC
  Administered 2021-03-03 (×2): 3.375 g via INTRAVENOUS
  Filled 2021-03-03 (×2): qty 50

## 2021-03-03 MED ORDER — ATORVASTATIN CALCIUM 40 MG PO TABS
40.0000 mg | ORAL_TABLET | Freq: Every day | ORAL | Status: DC
  Administered 2021-03-03 – 2021-03-04 (×2): 40 mg via ORAL
  Filled 2021-03-03 (×2): qty 1

## 2021-03-03 MED ORDER — GABAPENTIN 100 MG PO CAPS
200.0000 mg | ORAL_CAPSULE | Freq: Two times a day (BID) | ORAL | Status: DC
  Administered 2021-03-03 – 2021-03-04 (×3): 200 mg via ORAL
  Filled 2021-03-03 (×3): qty 2

## 2021-03-03 MED ORDER — TECHNETIUM TC 99M MEBROFENIN IV KIT
5.0000 | PACK | Freq: Once | INTRAVENOUS | Status: AC | PRN
  Administered 2021-03-03: 5.5 via INTRAVENOUS

## 2021-03-03 MED ORDER — METOPROLOL SUCCINATE ER 50 MG PO TB24
100.0000 mg | ORAL_TABLET | Freq: Every day | ORAL | Status: DC
  Administered 2021-03-03 – 2021-03-04 (×2): 100 mg via ORAL
  Filled 2021-03-03 (×2): qty 2

## 2021-03-03 MED ORDER — ACETAMINOPHEN 650 MG RE SUPP: 650.0000 mg | Freq: Four times a day (QID) | RECTAL | Status: DC | PRN

## 2021-03-03 MED ORDER — SERTRALINE HCL 50 MG PO TABS
50.0000 mg | ORAL_TABLET | Freq: Every day | ORAL | Status: DC
  Administered 2021-03-03 – 2021-03-04 (×2): 50 mg via ORAL
  Filled 2021-03-03 (×2): qty 1

## 2021-03-03 MED ORDER — ACETAMINOPHEN 325 MG PO TABS: 650.0000 mg | ORAL_TABLET | Freq: Four times a day (QID) | ORAL | Status: DC | PRN

## 2021-03-03 MED ORDER — ONDANSETRON HCL 4 MG/2ML IJ SOLN: 4.0000 mg | Freq: Four times a day (QID) | INTRAMUSCULAR | Status: DC | PRN

## 2021-03-03 MED ORDER — SODIUM CHLORIDE 0.9 % IV SOLN: INTRAVENOUS | Status: DC

## 2021-03-03 MED ORDER — ONDANSETRON HCL 4 MG PO TABS: 4.0000 mg | ORAL_TABLET | Freq: Four times a day (QID) | ORAL | Status: DC | PRN

## 2021-03-03 MED ORDER — POTASSIUM CHLORIDE CRYS ER 20 MEQ PO TBCR
40.0000 meq | EXTENDED_RELEASE_TABLET | Freq: Once | ORAL | Status: AC
  Administered 2021-03-03: 40 meq via ORAL
  Filled 2021-03-03: qty 2

## 2021-03-03 MED ORDER — CLONAZEPAM 0.25 MG PO TBDP: 0.2500 mg | ORAL_TABLET | Freq: Two times a day (BID) | ORAL | Status: DC | PRN

## 2021-03-03 MED ORDER — HEPARIN SODIUM (PORCINE) 5000 UNIT/ML IJ SOLN
5000.0000 [IU] | Freq: Three times a day (TID) | INTRAMUSCULAR | Status: DC
  Administered 2021-03-03 – 2021-03-04 (×4): 5000 [IU] via SUBCUTANEOUS
  Filled 2021-03-03 (×4): qty 1

## 2021-03-03 MED ORDER — AMLODIPINE BESYLATE 5 MG PO TABS
5.0000 mg | ORAL_TABLET | Freq: Every day | ORAL | Status: DC
  Administered 2021-03-03 – 2021-03-04 (×2): 5 mg via ORAL
  Filled 2021-03-03 (×2): qty 1

## 2021-03-03 MED ORDER — DOXAZOSIN MESYLATE 2 MG PO TABS
8.0000 mg | ORAL_TABLET | Freq: Every morning | ORAL | Status: DC
  Administered 2021-03-03 – 2021-03-04 (×2): 8 mg via ORAL
  Filled 2021-03-03 (×2): qty 4

## 2021-03-03 MED ORDER — OXYCODONE HCL 5 MG PO TABS
5.0000 mg | ORAL_TABLET | ORAL | Status: DC | PRN
  Administered 2021-03-03 (×2): 5 mg via ORAL
  Filled 2021-03-03 (×2): qty 1

## 2021-03-03 NOTE — Progress Notes (Addendum)
CT scan results reviewed. Zosyn started. Secure chat sent to Dr. Arnoldo Morale.

## 2021-03-03 NOTE — Consult Note (Signed)
[facility]  Patient s/p laparoscopic cholecystectomy. He was at his home and walking to have a BM and sat down against the wall/ sort of passed out. His daughter reported that when she came he was sweaty and very weak and could not get up. Her and her husband got him up and they were worried about a stroke. They called EMS and the stroke test was down and then he basically woke up and was normal and smiling. They are worried he took extra gabapentin because his gabapentin bottle is for 300 mg TID and he thinks he is taking 200mg  BID. His daughter is unsure.   He says he did not really have any abdomen pain and was having Bms.  CT last night showed a fluid collection concerning for bile leak.   BP (!) 144/78 (BP Location: Right Arm) Comment: Nurse Bethena Roys Notified  Pulse 63   Temp 98 F (36.7 C) (Oral)   Resp 18   Ht 5\' 8"  (1.727 m)   Wt 96.9 kg   SpO2 94%   BMI 32.48 kg/m   Soft, minimally distended, nontender, port sites healing  HIDA negative for leak WBC mild and no shift, no fevers Reviewed Ct and this looks like post op collection, possible hematoma with the Surgicel in place, no rim enhancement, I am not worried about abscess at this time  Discussed with patient and daughter. Unsure why he almost passed out. Do not think the collection is infected. Diet  Stopped zosyn CBC with differential in the AM  Curlene Labrum, MD [facility] [address] [place], [place] [address] [phone] (office)

## 2021-03-03 NOTE — H&P (Signed)
TRH H&P    Patient Demographics:    Derrick Lawrence, is a 80 y.o. adult  MRN: [id number]  DOB - 1940/08/13  Admit Date - 03/02/2021  Referring MD/NP/PA: Ileene Patrick  Outpatient Primary MD for the patient is Christain Sacramento, MD  Patient coming from: Home  Chief complaint- Syncope   HPI:    Derrick Lawrence  is a 80 y.o. adult, with history of nonmelanoma skin cancer, hyperlipidemia, hypertension, CVA, presents the ED with a chief complaint of fall.  Patient reports that he got up to go to the bathroom, and when he reached to the bathroom he blacked out.  He reports he remembers going down to the ground and he did not hit his head.  He thinks he lost consciousness for approximately 1 minute.  He was not able to get up on his own.  He did not have any pain from his fall, and reports that his only pain is in his right upper quadrant from recent lap chole.  His son-in-law had to come help pick him up and put him in a chair.  They then called EMS.  Patient denies any preceding factors like chest pain, shortness of breath, palpitations.  Patient was incontinent of feces during his loss of consciousness.  Patient reports that he has had syncopal episode 1 time before.  That was 1-2 years ago.  He fell hit his head and broke his glasses at that time.  Patient reports that there were no etiologies of his syncopal episode identified at that time.  Patient reports has been eating and drinking normally.  He has normal appetite.  His last normal bowel movement was today.  He has had no blood in his stool.  Patient denies any nausea and vomiting.  He does feel bloated but does had a lap chole 3 days ago.  His lap chole was same-day surgery.  He was sent home with hydrocodone and does not feel like its been controlling his pain.  Patient has no other complaints at this time.  Is worth noting that during the first preop visit the patient had,  he had an episode of V. tach.  He was referred to cardiology who did an echo and then cleared him for surgery.  Patient does not smoke, does not drink, does not use illicit drugs.  Patient is vaccinated for COVID.  Patient is full code.  In the ED Temp 97.7, heart rate 61, respiratory 16-19, blood pressure 147/76, satting 96% Patient was slightly hyponatremic at 130, euglycemic with a glucose of 121, calcium corrects with a calcium of 7.9 and an albumin of 2.6 Patient is a leukocytosis at 13.7, hemoglobin 12.3, platelets 281 KUB shows no acute findings. Troponin is 6 Admission requested for further work-up of syncope Orthostatic vital signs positive with a drop of standing blood pressure down to 95 systolic      Review of systems:    In addition to the HPI above,  No Fever-chills, No Headache, No changes with Vision or hearing, No problems swallowing food or Liquids,  No Chest pain, Cough or Shortness of Breath, No Blood in stool or Urine, No dysuria, No new skin rashes or bruises, No new joints pains-aches,  No new weakness, tingling, numbness in any extremity, No recent weight gain or loss, No polyuria, polydypsia or polyphagia, No significant Mental Stressors.  All other systems reviewed and are negative.    Past History of the following :    Past Medical History:  Diagnosis Date   ED (erectile dysfunction)    H/O nonmelanoma skin cancer    Hyperlipidemia    Hypertension    Stroke ([place])    left sided weakness- minimal;      Past Surgical History:  Procedure Laterality Date   CATARACT EXTRACTION Right    CHOLECYSTECTOMY N/A 02/27/2021   Procedure: LAPAROSCOPIC CHOLECYSTECTOMY;  Surgeon: Aviva Signs, MD;  Location: [facility];  Service: General;  Laterality: N/A;   HERNIA REPAIR     Abdominal   SKIN CANCER EXCISION        Social History:      Social History   Tobacco Use   Smoking status: Never   Smokeless tobacco: Never  Substance Use Topics    Alcohol use: No       Family History :     Family History  Problem Relation Age of Onset   Cancer - Lung Mother    Stroke Son       Home Medications:   Prior to Admission medications   Medication Sig Start Date End Date Taking? Authorizing Provider  amLODipine (NORVASC) 5 MG tablet Take 5 mg by mouth daily. 05/06/19   [provider]  aspirin 81 MG EC tablet Take 81 mg by mouth daily.    [provider]  atorvastatin (LIPITOR) 40 MG tablet Take 1 tablet (40 mg total) by mouth daily. 02/17/21 02/17/22  Kathie Dike, MD  clonazePAM (KLONOPIN) 0.5 MG tablet  06/20/20   [provider]  Cyanocobalamin (B-12) 2500 MCG TABS Take 2,500 mcg by mouth daily.    [provider]  doxazosin (CARDURA) 8 MG tablet Take 1 tablet (8 mg total) by mouth at bedtime. Patient taking differently: Take 8 mg by mouth every morning. 10/26/12   Lysbeth Penner, FNP  fluticasone (FLONASE) 50 MCG/ACT nasal spray Place 2 sprays into the nose daily. 10/12/12   Lysbeth Penner, FNP  gabapentin (NEURONTIN) 100 MG capsule Take 200 mg by mouth 2 (two) times daily. 12/10/20   [provider]  metoprolol succinate (TOPROL-XL) 100 MG 24 hr tablet Take 100 mg by mouth daily. 10/19/18   [provider]  oxyCODONE (ROXICODONE) 5 MG immediate release tablet Take 1 tablet (5 mg total) by mouth every 6 (six) hours as needed. 02/27/21 02/27/22  Aviva Signs, MD  sertraline (ZOLOFT) 50 MG tablet Take 50 mg by mouth daily. 11/21/20   [provider]  valACYclovir (VALTREX) 1000 MG tablet Take 1,000 mg by mouth See admin instructions. Take two tablets (2000 mg) by mouth at onset of cold sore, may take two tablets (2000 mg) 12 hours later if still needed. Patient not taking: Reported on 02/20/2021    [provider]     Allergies:    No Known Allergies   Physical Exam:   Vitals  Blood pressure (!) 145/74, pulse 66, temperature 97.7 F (36.5 C),  temperature source Oral, resp. rate 16, height 5\' 8"  (1.727 m), weight 103 kg, SpO2 96 %.   1.  General: Patient lying supine in bed,  no acute distress   2. Psychiatric: Alert and oriented x 3, mood and behavior normal for situation, pleasant and cooperative with exam   3. Neurologic: Speech and language are normal, face is symmetric, moves all 4 extremities voluntarily, at baseline without acute deficits on limited exam   4. HEENMT:  Head is atraumatic, normocephalic, pupils reactive to light, neck is supple, trachea is midline, mucous membranes are moist   5. Respiratory : Lungs are clear to auscultation bilaterally without wheezing, rhonchi, rales, no cyanosis, no increase in work of breathing or accessory muscle use   6. Cardiovascular : Heart rate normal, rhythm is regular, no murmurs, rubs or gallops, no peripheral edema, peripheral pulses palpated   7. Gastrointestinal:  Abdomen is soft, nondistended, nontender to palpation bowel sounds active, no masses or organomegaly palpated   8. Skin:  Skin is warm, dry and intact without rashes, acute lesions, or ulcers on limited exam   9.Musculoskeletal:  No acute deformities or trauma, no asymmetry in tone, no peripheral edema, peripheral pulses palpated, no tenderness to palpation in the extremities     Data Review:    CBC Recent Labs  Lab 03/02/21 2158  WBC 13.7*  HGB 12.3*  HCT 36.0*  PLT 281  MCV 90.7  MCH 31.0  MCHC 34.2  RDW 13.3   ------------------------------------------------------------------------------------------------------------------  Results for orders placed or performed during the hospital encounter of 03/02/21 (from the past 48 hour(s))  CBG monitoring, ED     Status: Abnormal   Collection Time: 03/02/21  9:43 PM  Result Value Ref Range   Glucose-Capillary 120 (H) 70 - 99 mg/dL    Comment: Glucose reference range applies only to samples taken after fasting for at least 8 hours.  CBC      Status: Abnormal   Collection Time: 03/02/21  9:58 PM  Result Value Ref Range   WBC 13.7 (H) 4.0 - 10.5 K/uL   RBC 3.97 (L) 4.22 - 5.81 MIL/uL   Hemoglobin 12.3 (L) 13.0 - 17.0 g/dL   HCT 36.0 (L) 39.0 - 52.0 %   MCV 90.7 80.0 - 100.0 fL   MCH 31.0 26.0 - 34.0 pg   MCHC 34.2 30.0 - 36.0 g/dL   RDW 13.3 11.5 - 15.5 %   Platelets 281 150 - 400 K/uL   nRBC 0.0 0.0 - 0.2 %    Comment: Performed at [facility], [address] [place], [place] [address]  Comprehensive metabolic panel     Status: Abnormal   Collection Time: 03/02/21  9:58 PM  Result Value Ref Range   Sodium 130 (L) 135 - 145 mmol/L   Potassium 3.9 3.5 - 5.1 mmol/L   Chloride 98 98 - 111 mmol/L   CO2 25 22 - 32 mmol/L   Glucose, Bld 121 (H) 70 - 99 mg/dL    Comment: Glucose reference range applies only to samples taken after fasting for at least 8 hours.   BUN 17 8 - 23 mg/dL   Creatinine, Ser 1.05 0.61 - 1.24 mg/dL   Calcium 7.9 (L) 8.9 - 10.3 mg/dL   Total Protein 5.6 (L) 6.5 - 8.1 g/dL   Albumin 2.6 (L) 3.5 - 5.0 g/dL   AST 30 15 - 41 U/L   ALT 46 (H) 0 - 44 U/L   Alkaline Phosphatase 77 38 - 126 U/L   Total Bilirubin 1.1 0.3 - 1.2 mg/dL   GFR, Estimated >60 >60 mL/min    Comment: (NOTE) Calculated using the CKD-EPI  Creatinine Equation (2021)    Anion gap 7 5 - 15    Comment: Performed at [facility], [address] [place], [place] [address]  Troponin I (High Sensitivity)     Status: None   Collection Time: 03/02/21  9:58 PM  Result Value Ref Range   Troponin I (High Sensitivity) 6 <18 ng/L    Comment: (NOTE) Elevated high sensitivity troponin I (hsTnI) values and significant  changes across serial measurements may suggest ACS but many other  chronic and acute conditions are known to elevate hsTnI results.  Refer to the "Links" section for chest pain algorithms and additional  guidance. Performed at [facility], [address] [place], [place] [address]   Lipase, blood     Status: None   Collection  Time: 03/02/21  9:58 PM  Result Value Ref Range   Lipase 21 11 - 51 U/L    Comment: Performed at [facility], [address], [place], [place] [address]    Chemistries  Recent Labs  Lab 03/02/21 2158  NA 130*  K 3.9  CL 98  CO2 25  GLUCOSE 121*  BUN 17  CREATININE 1.05  CALCIUM 7.9*  AST 30  ALT 46*  ALKPHOS 77  BILITOT 1.1   ------------------------------------------------------------------------------------------------------------------  ------------------------------------------------------------------------------------------------------------------ GFR: Estimated Creatinine Clearance (by C-G formula based on SCr of 1.05 mg/dL) Male: 55.4 mL/min Male: 67.4 mL/min Liver Function Tests: Recent Labs  Lab 03/02/21 2158  AST 30  ALT 46*  ALKPHOS 77  BILITOT 1.1  PROT 5.6*  ALBUMIN 2.6*   Recent Labs  Lab 03/02/21 2158  LIPASE 21   No results for input(s): AMMONIA in the last 168 hours. Coagulation Profile: No results for input(s): INR, PROTIME in the last 168 hours. Cardiac Enzymes: No results for input(s): CKTOTAL, CKMB, CKMBINDEX, TROPONINI in the last 168 hours. BNP (last 3 results) No results for input(s): PROBNP in the last 8760 hours. HbA1C: No results for input(s): HGBA1C in the last 72 hours. CBG: Recent Labs  Lab 03/02/21 2143  GLUCAP 120*   Lipid Profile: No results for input(s): CHOL, HDL, LDLCALC, TRIG, CHOLHDL, LDLDIRECT in the last 72 hours. Thyroid Function Tests: No results for input(s): TSH, T4TOTAL, FREET4, T3FREE, THYROIDAB in the last 72 hours. Anemia Panel: No results for input(s): VITAMINB12, FOLATE, FERRITIN, TIBC, IRON, RETICCTPCT in the last 72 hours.  --------------------------------------------------------------------------------------------------------------- Urine analysis:    Component Value Date/Time   COLORURINE YELLOW 02/13/2021 [place] 02/13/2021 1725   LABSPEC 1.015 02/13/2021 1725    PHURINE 6.0 02/13/2021 1725   GLUCOSEU NEGATIVE 02/13/2021 1725   HGBUR NEGATIVE 02/13/2021 1725   BILIRUBINUR NEGATIVE 02/13/2021 1725   KETONESUR NEGATIVE 02/13/2021 1725   PROTEINUR NEGATIVE 02/13/2021 1725   NITRITE NEGATIVE 02/13/2021 1725   LEUKOCYTESUR NEGATIVE 02/13/2021 1725      Imaging Results:    DG Abdomen 1 View  Result Date: 03/02/2021 CLINICAL DATA:  Abdominal tenderness, bloating EXAM: ABDOMEN - 1 VIEW COMPARISON:  CT 02/13/2021 FINDINGS: Gas throughout nondistended large and small bowel. No organomegaly or free air. No suspicious calcification. IMPRESSION: No acute findings. Electronically Signed   By: Rolm Baptise M.D.   On: 03/02/2021 23:03    EKG pending   Assessment & Plan:    Principal Problem:   Syncope Active Problems:   HTN (hypertension)   Hyperlipidemia   Abdominal pain   Protein calorie malnutrition (HCC)   Syncope Most likely related to orthostatic hypotension Patient does have history of  Stephanie Coup Monitor on telemetry Troponin normal EKG pending Continue to monitor Abdominal pain With recent cholecystectomy 02/27/2021 Continue pain control with morphine Continue Zofran for nausea CT abdomen and pelvis pending Continue to monitor Protein calorie malnutrition Moderate protein calorie malnutrition with an albumin of 2.6 Likely secondary to poor p.o. intake given recent surgery Continue to trend Hypertension Continue Norvasc, Toprol Continue to monitor Anxiety Continue Klonopin and Zoloft Hyperlipidemia Continue atorvastatin    DVT Prophylaxis-   Heparin - SCDs   AM Labs Ordered, also please review Full Orders  Family Communication: No family at bedside Code Status: Full  Admission status: Inpatient :The appropriate admission status for this patient is INPATIENT. Inpatient status is judged to be reasonable and necessary in order to provide the required intensity of service to ensure the patient's safety. The patient's presenting  symptoms, physical exam findings, and initial radiographic and laboratory data in the context of their chronic comorbidities is felt to place them at high risk for further clinical deterioration. Furthermore, it is not anticipated that the patient will be medically stable for discharge from the hospital within 2 midnights of admission. The following factors support the admission status of inpatient.     The patient's presenting symptoms include fall/syncope. The worrisome physical exam findings include orthostatic hypotension. The initial radiographic and laboratory data are worrisome because of CK pending, leukocytosis 13.7. The chronic co-morbidities include hypertension, hyperlipidemia, CVA.       * I certify that at the point of admission it is my clinical judgment that the patient will require inpatient hospital care spanning beyond 2 midnights from the point of admission due to high intensity of service, high risk for further deterioration and high frequency of surveillance required.*  Disposition: Anticipated Discharge date 48-72 hours discharge to home  Time spent in minutes : [place] DO

## 2021-03-03 NOTE — Progress Notes (Signed)
   Patient seen and examined at bedside, patient admitted after midnight, please see earlier detailed admission note by [place] B Zierle-Ghosh, DO. Briefly, patient presented secondary to syncope vs pre-syncope. Patient found to have evidence of a subhepatic fluid collection  concerning for an abscess vs bile leak in setting of recent cholecystectomy.  Subjective: Some continued abdominal pain.  BP (!) 144/78 (BP Location: Right Arm) Comment: Nurse Bethena Roys Notified  Pulse 63   Temp 98 F (36.7 C) (Oral)   Resp 18   Ht 5\' 8"  (1.727 m)   Wt 96.9 kg   SpO2 94%   BMI 32.48 kg/m   General exam: Appears calm and comfortable Respiratory system: Clear to auscultation. Respiratory effort normal. Cardiovascular system: S1 & S2 heard, Normal rate with regular rhythm. Gastrointestinal system: Abdomen is nondistended, soft and tender in RUQ. No organomegaly or masses felt. Normal bowel sounds heard. Central nervous system: Alert and oriented. No focal neurological deficits. Musculoskeletal: No edema. No calf tenderness Skin: No cyanosis. No rashes Psychiatry: Judgement and insight appear normal. Mood & affect appropriate.   Brief assessment/Plan:  Pre-syncope Patient states he did not pass out, but rather went down and could not get up. No loss of consciousness. Seemingly no prodrome. Possible related to now diagnosed fluid collection concerning for abscess vs bile leak -Orthostatic vitals -Continue Telemetry  Abdominal pain Subhepatic fluid collection In setting of recent cholecystitis. Concern for abscess vs bile leak. Continued abdominal pain. Afebrile. Mild leukocytosis. Empirically started on Zosyn. General surgery consulted. HIDA ordered and pending -Follow-up General surgery recommendations -Follow-up HIDA -Continue Zosyn  Hypoalbuminemia -Dietitian consult  Primary hypertension -Continue amlodipine and Toprol XL  Anxiety -Continue Zoloft and  Klonopin  Hypokalemia -Kdur  Hyperlipidemia -Continue Lipitor  Family communication: Grandson at bedside DVT prophylaxis: Heparin subq Disposition: Discharge home likely in 3-5 days pending evaluation and management of abdominal fluid collection  Cordelia Poche, MD [facility] 03/03/2021, 1:55 PM

## 2021-03-04 DIAGNOSIS — R109 Unspecified abdominal pain: Secondary | ICD-10-CM | POA: Diagnosis not present

## 2021-03-04 DIAGNOSIS — R55 Syncope and collapse: Secondary | ICD-10-CM | POA: Diagnosis not present

## 2021-03-04 DIAGNOSIS — I951 Orthostatic hypotension: Secondary | ICD-10-CM

## 2021-03-04 LAB — CBC WITH DIFFERENTIAL/PLATELET
Abs Immature Granulocytes: 0.06 10*3/uL (ref 0.00–0.07)
Basophils Absolute: 0 10*3/uL (ref 0.0–0.1)
Basophils Relative: 1 %
Eosinophils Absolute: 0.2 10*3/uL (ref 0.0–0.5)
Eosinophils Relative: 2 %
HCT: 32.8 % — ABNORMAL LOW (ref 39.0–52.0)
Hemoglobin: 11.4 g/dL — ABNORMAL LOW (ref 13.0–17.0)
Immature Granulocytes: 1 %
Lymphocytes Relative: 19 %
Lymphs Abs: 1.5 10*3/uL (ref 0.7–4.0)
MCH: 31.5 pg (ref 26.0–34.0)
MCHC: 34.8 g/dL (ref 30.0–36.0)
MCV: 90.6 fL (ref 80.0–100.0)
Monocytes Absolute: 0.8 10*3/uL (ref 0.1–1.0)
Monocytes Relative: 10 %
Neutro Abs: 5.5 10*3/uL (ref 1.7–7.7)
Neutrophils Relative %: 67 %
Platelets: 245 10*3/uL (ref 150–400)
RBC: 3.62 MIL/uL — ABNORMAL LOW (ref 4.22–5.81)
RDW: 13.2 % (ref 11.5–15.5)
WBC: 8 10*3/uL (ref 4.0–10.5)
nRBC: 0 % (ref 0.0–0.2)

## 2021-03-04 LAB — BASIC METABOLIC PANEL
Anion gap: 6 (ref 5–15)
BUN: 18 mg/dL (ref 8–23)
CO2: 23 mmol/L (ref 22–32)
Calcium: 7.5 mg/dL — ABNORMAL LOW (ref 8.9–10.3)
Chloride: 104 mmol/L (ref 98–111)
Creatinine, Ser: 0.92 mg/dL (ref 0.61–1.24)
GFR, Estimated: >60 mL/min (ref >60)
Glucose, Bld: 87 mg/dL (ref 70–99)
Potassium: 3.3 mmol/L — ABNORMAL LOW (ref 3.5–5.1)
Sodium: 133 mmol/L — ABNORMAL LOW (ref 135–145)

## 2021-03-04 LAB — MAGNESIUM: Magnesium: 1.9 mg/dL (ref 1.7–2.4)

## 2021-03-04 MED ORDER — POTASSIUM CHLORIDE CRYS ER 20 MEQ PO TBCR: 20.0000 meq | EXTENDED_RELEASE_TABLET | Freq: Every day | ORAL | 0 refills | Status: DC

## 2021-03-04 MED ORDER — POTASSIUM CHLORIDE CRYS ER 20 MEQ PO TBCR
40.0000 meq | EXTENDED_RELEASE_TABLET | ORAL | Status: DC
  Administered 2021-03-04: 40 meq via ORAL
  Filled 2021-03-04: qty 2

## 2021-03-04 NOTE — Progress Notes (Signed)
Subjective: Patient feels fine.  No significant abdominal pain noted.  Wants to go home.  Objective: Vital signs in last 24 hours: Temp:  [98 F (36.7 C)-99.2 F (37.3 C)] 99.2 F (37.3 C) (12/12 0410) Pulse Rate:  [61-73] 73 (12/12 0410) Resp:  [18] 18 (12/12 0410) BP: (133-169)/(69-91) 169/91 (12/12 0410) SpO2:  [91 %-98 %] 96 % (12/12 0813) Last BM Date: 03/02/21  Intake/Output from previous day: 12/11 0701 - 12/12 0700 In: 962.3 [P.O.:420; I.V.:492.3; IV Piggyback:50] Out: 700 [Urine:700] Intake/Output this shift: No intake/output data recorded.  General appearance: alert, cooperative, and no distress GI: Soft, nontender, nondistended.  Incisions healing well.  Lab Results:  Recent Labs    03/03/21 0414 03/04/21 0420  WBC 11.3* 8.0  HGB 11.7* 11.4*  HCT 35.0* 32.8*  PLT 267 245   BMET Recent Labs    03/03/21 0414 03/04/21 0420  NA 131* 133*  K 3.2* 3.3*  CL 100 104  CO2 24 23  GLUCOSE 105* 87  BUN 18 18  CREATININE 0.91 0.92  CALCIUM 7.9* 7.5*   PT/INR Recent Labs    03/03/21 0414  LABPROT 15.3*  INR 1.2    Studies/Results: CT ABDOMEN PELVIS WO CONTRAST  Result Date: 03/03/2021 CLINICAL DATA:  Abdominal pain. EXAM: CT ABDOMEN AND PELVIS WITHOUT CONTRAST TECHNIQUE: Multidetector CT imaging of the abdomen and pelvis was performed following the standard protocol without IV contrast. COMPARISON:  CT abdomen pelvis dated 02/13/2021. FINDINGS: Evaluation of this exam is limited in the absence of intravenous contrast. Lower chest: Partially visualized small bilateral pleural effusions with partial compressive atelectasis of the lower lobes. Pneumonia is not excluded No intra-abdominal free air or free fluid. Hepatobiliary: The liver is unremarkable. Cholecystectomy. There is a loculated subhepatic fluid collection in the cholecystectomy bed containing air and debris and measuring approximately 6 x 9 cm most concerning for a bile leak. A developing abscess  is not excluded. However, there appears to be suppression of the cholecystectomy clips. There is a 3 x 10 cm subcapsular fluid along the undersurface of the right lobe of the liver. Pancreas: Unremarkable. No pancreatic ductal dilatation or surrounding inflammatory changes. Spleen: Normal in size without focal abnormality. Adrenals/Urinary Tract: The adrenal glands unremarkable. There is no hydronephrosis or nephrolithiasis on either side. There is a 4 cm septated right renal inferior pole cyst with a calcified septation. A 15 mm hypodense lesion in the interpolar left kidney is not characterized on this noncontrast CT. The visualized ureters and urinary bladder appear unremarkable. Stomach/Bowel: There is sigmoid diverticulosis without active inflammatory changes. Moderate stool throughout the colon. There is no bowel obstruction or active inflammation. The appendix is normal. Vascular/Lymphatic: Moderate aortoiliac atherosclerotic disease. The IVC is unremarkable. No portal venous gas. There is no adenopathy. Reproductive: The prostate and seminal vesicles are grossly unremarkable. No pelvic mass. Other: None Musculoskeletal: Degenerative changes of the spine. No acute osseous pathology. IMPRESSION: 1. Status post cholecystectomy with a loculated subhepatic fluid collection which may represent developing abscess but concerning for bile leak. Hepatobiliary scintigraphy may provide better evaluation. 2. Sigmoid diverticulosis. No bowel obstruction. Normal appendix. 3. Partially visualized small bilateral pleural effusions with partial compressive atelectasis of the lower lobes. Pneumonia is not excluded. 4. Aortic Atherosclerosis (ICD10-I70.0). Electronically Signed   By: Anner Crete M.D.   On: 03/03/2021 01:27   DG Abdomen 1 View  Result Date: 03/02/2021 CLINICAL DATA:  Abdominal tenderness, bloating EXAM: ABDOMEN - 1 VIEW COMPARISON:  CT 02/13/2021 FINDINGS: Gas throughout nondistended  large and small  bowel. No organomegaly or free air. No suspicious calcification. IMPRESSION: No acute findings. Electronically Signed   By: Rolm Baptise M.D.   On: 03/02/2021 23:03   NM HEPATOBILIARY LEAK (POST-SURGICAL)  Result Date: 03/03/2021 CLINICAL DATA:  Postop cholecystectomy pain.  Concern for bile leak. EXAM: NUCLEAR MEDICINE HEPATOBILIARY IMAGING TECHNIQUE: Sequential images of the abdomen were obtained out to 60 minutes following intravenous administration of radiopharmaceutical. RADIOPHARMACEUTICALS:  5.5 mCi Tc-86m  Choletec IV COMPARISON:  CT 03/03/2021 FINDINGS: Prompt clearance radiotracer from blood pool and homogeneous uptake liver. Counts are evident in the common bile duct and proximal small bowel by 15 minutes. Counts continue to progress through the extrahepatic ducts in the more distal small bowel over 60 minutes. There is no evidence of extra biliary excreted radiotracer. No radiotracer in the gallbladder fossa. IMPRESSION: No evidence of bile leak or biloma. Patent common bile duct. Electronically Signed   By: Suzy Bouchard M.D.   On: 03/03/2021 14:56    Anti-infectives: Anti-infectives (From admission, onward)    Start     Dose/Rate Route Frequency Ordered Stop   03/03/21 0600  piperacillin-tazobactam (ZOSYN) IVPB 3.375 g  Status:  Discontinued        3.375 g 12.5 mL/hr over 240 Minutes Intravenous Every 8 hours 03/03/21 0550 03/03/21 1715       Assessment/Plan: Impression: Syncopal episode at home.  This may have been due to his taking both gabapentin and hydrocodone.  He just received the gabapentin prescription around the time of surgery.  He does not have a bile leak and I doubt he has an intra-abdominal infection at the present time.  His leukocytosis quickly resolved and he is on no antibiotics at the present time.  I told him that he should stop any hydrocodone that he may have.  He may restart the gabapentin but instead of taking it 3 times a day, he may want to only take it  as needed for his peripheral neuropathy.  He is cleared for discharge from the surgery standpoint.  LOS: 2 days    Aviva Signs 03/04/2021

## 2021-03-04 NOTE — Discharge Summary (Signed)
Physician Discharge Summary  Tahmir Kleckner [facility] WUJ:[id number] DOB: 07-28-1940 DOA: 03/02/2021  PCP: Christain Sacramento, MD  Admit date: 03/02/2021 Discharge date: 03/04/2021  Admitted From: Home Disposition: Home  Recommendations for Outpatient Follow-up:  Follow up with PCP in 1 week Please obtain BMP/CBC in one week Please follow up on the following pending results: None  Home Health: None Equipment/Devices: None  Discharge Condition: Stable CODE STATUS: Full code Diet recommendation: Heart healthy   Brief/Interim Summary:  Admission HPI written by [place], DO    HPI:    Earnie Bechard  is a 80 y.o. adult, with history of nonmelanoma skin cancer, hyperlipidemia, hypertension, CVA, presents the ED with a chief complaint of fall.  Patient reports that he got up to go to the bathroom, and when he reached to the bathroom he blacked out.  He reports he remembers going down to the ground and he did not hit his head.  He thinks he lost consciousness for approximately 1 minute.  He was not able to get up on his own.  He did not have any pain from his fall, and reports that his only pain is in his right upper quadrant from recent lap chole.  His son-in-law had to come help pick him up and put him in a chair.  They then called EMS.  Patient denies any preceding factors like chest pain, shortness of breath, palpitations.  Patient was incontinent of feces during his loss of consciousness.  Patient reports that he has had syncopal episode 1 time before.  That was 1-2 years ago.  He fell hit his head and broke his glasses at that time.  Patient reports that there were no etiologies of his syncopal episode identified at that time.  Patient reports has been eating and drinking normally.  He has normal appetite.  His last normal bowel movement was today.  He has had no blood in his stool.  Patient denies any nausea and vomiting.  He does feel bloated but does had a lap chole 3 days ago.   His lap chole was same-day surgery.  He was sent home with hydrocodone and does not feel like its been controlling his pain.  Patient has no other complaints at this time.   Is worth noting that during the first preop visit the patient had, he had an episode of V. tach.  He was referred to cardiology who did an echo and then cleared him for surgery.   Patient does not smoke, does not drink, does not use illicit drugs.  Patient is vaccinated for COVID.  Patient is full code.   Hospital course:  Syncope Patient states he did not pass out, but rather went down and could not get up. Family states patient did lose consciousness. Seemingly no prodrome. Likely secondary to orthostatic hypotension. Orthostatic vitals positive, however patient was asymptomatic at discharge. Patient with supine hypertension, likely not amenable to reduction in antihypertensives.   Abdominal pain Subhepatic fluid collection In setting of recent cholecystitis. Concern for abscess vs bile leak. Continued abdominal pain. Afebrile. Mild leukocytosis. Empirically started on Zosyn. General surgery consulted. Nuclear med scan was negative for a bile leak. General surgery assessed and assessed that fluid collection is likely post-operative fluid rather than infection or bile leak; recommended to discontinue antibiotics.   Hypoalbuminemia Dietitian consult   Primary hypertension Continue amlodipine, doxazosin and Toprol XL   Anxiety Continue Zoloft and Klonopin   Hypokalemia Potassium supplementation.   Hyperlipidemia Continue  Lipitor  Discharge Diagnoses:  Principal Problem:   Syncope Active Problems:   HTN (hypertension)   Hyperlipidemia   Abdominal pain   Protein calorie malnutrition (HCC)   Orthostatic hypotension    Discharge Instructions  Discharge Instructions     Increase activity slowly   Complete by: As directed    No wound care   Complete by: As directed       Allergies as of 03/04/2021    No Known Allergies      Medication List     STOP taking these medications    oxyCODONE 5 MG immediate release tablet Commonly known as: Roxicodone       TAKE these medications    amLODipine 5 MG tablet Commonly known as: NORVASC Take 5 mg by mouth daily.   aspirin 81 MG EC tablet Take 81 mg by mouth daily.   atorvastatin 40 MG tablet Commonly known as: Lipitor Take 1 tablet (40 mg total) by mouth daily.   B-12 2500 MCG Tabs Take 2,500 mcg by mouth daily.   clonazePAM 0.5 MG tablet Commonly known as: KLONOPIN Take 0.5 mg by mouth daily as needed for anxiety.   doxazosin 8 MG tablet Commonly known as: CARDURA Take 1 tablet (8 mg total) by mouth at bedtime. What changed: when to take this   fluticasone 50 MCG/ACT nasal spray Commonly known as: FLONASE Place 2 sprays into the nose daily.   gabapentin 300 MG capsule Commonly known as: NEURONTIN Take 300 mg by mouth 3 (three) times daily as needed (up to 3x a day for burning feet).   metoprolol succinate 100 MG 24 hr tablet Commonly known as: TOPROL-XL Take 100 mg by mouth daily.   potassium chloride SA 20 MEQ tablet Commonly known as: KLOR-CON M Take 1 tablet (20 mEq total) by mouth daily for 3 days. Start taking on: March 05, 2021   sertraline 50 MG tablet Commonly known as: ZOLOFT Take 50 mg by mouth daily.        Follow-up Information     Christain Sacramento, MD. Schedule an appointment as soon as possible for a visit in 1 week(s).   Specialty: Family Medicine Why: For hospital follow-up Contact information: [place] [address] [phone]                No Known Allergies  Consultations: General surgery   Procedures/Studies: CT ABDOMEN PELVIS WO CONTRAST  Result Date: 03/03/2021 CLINICAL DATA:  Abdominal pain. EXAM: CT ABDOMEN AND PELVIS WITHOUT CONTRAST TECHNIQUE: Multidetector CT imaging of the abdomen and pelvis was performed following the standard  protocol without IV contrast. COMPARISON:  CT abdomen pelvis dated 02/13/2021. FINDINGS: Evaluation of this exam is limited in the absence of intravenous contrast. Lower chest: Partially visualized small bilateral pleural effusions with partial compressive atelectasis of the lower lobes. Pneumonia is not excluded No intra-abdominal free air or free fluid. Hepatobiliary: The liver is unremarkable. Cholecystectomy. There is a loculated subhepatic fluid collection in the cholecystectomy bed containing air and debris and measuring approximately 6 x 9 cm most concerning for a bile leak. A developing abscess is not excluded. However, there appears to be suppression of the cholecystectomy clips. There is a 3 x 10 cm subcapsular fluid along the undersurface of the right lobe of the liver. Pancreas: Unremarkable. No pancreatic ductal dilatation or surrounding inflammatory changes. Spleen: Normal in size without focal abnormality. Adrenals/Urinary Tract: The adrenal glands unremarkable. There is no hydronephrosis or nephrolithiasis on either  side. There is a 4 cm septated right renal inferior pole cyst with a calcified septation. A 15 mm hypodense lesion in the interpolar left kidney is not characterized on this noncontrast CT. The visualized ureters and urinary bladder appear unremarkable. Stomach/Bowel: There is sigmoid diverticulosis without active inflammatory changes. Moderate stool throughout the colon. There is no bowel obstruction or active inflammation. The appendix is normal. Vascular/Lymphatic: Moderate aortoiliac atherosclerotic disease. The IVC is unremarkable. No portal venous gas. There is no adenopathy. Reproductive: The prostate and seminal vesicles are grossly unremarkable. No pelvic mass. Other: None Musculoskeletal: Degenerative changes of the spine. No acute osseous pathology. IMPRESSION: 1. Status post cholecystectomy with a loculated subhepatic fluid collection which may represent developing abscess but  concerning for bile leak. Hepatobiliary scintigraphy may provide better evaluation. 2. Sigmoid diverticulosis. No bowel obstruction. Normal appendix. 3. Partially visualized small bilateral pleural effusions with partial compressive atelectasis of the lower lobes. Pneumonia is not excluded. 4. Aortic Atherosclerosis (ICD10-I70.0). Electronically Signed   By: Anner Crete M.D.   On: 03/03/2021 01:27   DG Abdomen 1 View  Result Date: 03/02/2021 CLINICAL DATA:  Abdominal tenderness, bloating EXAM: ABDOMEN - 1 VIEW COMPARISON:  CT 02/13/2021 FINDINGS: Gas throughout nondistended large and small bowel. No organomegaly or free air. No suspicious calcification. IMPRESSION: No acute findings. Electronically Signed   By: Rolm Baptise M.D.   On: 03/02/2021 23:03   MR LUMBAR SPINE WO CONTRAST  Result Date: 02/12/2021 CLINICAL DATA:  Lower back pain radiating down the right leg for 2 months EXAM: MRI LUMBAR SPINE WITHOUT CONTRAST TECHNIQUE: Multiplanar, multisequence MR imaging of the lumbar spine was performed. No intravenous contrast was administered. COMPARISON:  Lumbar myelogram 02/10/2001 (report only) FINDINGS: Segmentation: Standard; the lowest formed disc space is designated L5-S1. Alignment: There is straightening of the normal lumbar spine lordosis. There is trace anterolisthesis of L3 on L4. Alignment is otherwise normal. Vertebrae: Vertebral body heights are preserved. Marrow signal is normal. Conus medullaris and cauda equina: Conus extends to the mid L1 level. Conus and cauda equina appear normal. Paraspinal and other soft tissues: Negative. Disc levels: There is marked disc desiccation and narrowing at L5-S1. There is mild desiccation without significant loss of height at the other levels. There is multilevel facet arthropathy, most advanced at L4-L5. There bilateral facet joint effusions at L2-L3 through L5-S1, most notable at L4-L5 on the right. T12-L1: No significant spinal canal or neural  foraminal stenosis. L1-L2: Mild bilateral facet arthropathy without significant spinal canal or neural foraminal stenosis. L2-L3: There is a mild disc bulge, ligamentum flavum thickening, and bilateral facet arthropathy resulting in moderate spinal canal stenosis with crowding of the cauda equina nerve roots and right subarticular zone and possible impingement of the traversing right L3 nerve root, without significant neural foraminal stenosis. L3-L4: There is a diffuse disc bulge with a superiorly migrated left subarticular zone extrusion measuring approximately 1.2 cm, ligamentum flavum thickening, and bilateral facet arthropathy resulting in severe spinal canal stenosis with compression of the cauda equina nerve roots and mild bilateral neural foraminal stenosis. L4-L5: There is a mild disc bulge with a small superiorly migrated right subarticular zone extrusion measuring approximately 6 mm, ligamentum flavum thickening, and advanced bilateral facet arthropathy resulting in mild spinal canal stenosis with crowding of the subarticular zones but no definite evidence of nerve root impingement, and no significant neural foraminal stenosis. L5-S1: There is a mild disc bulge, degenerative endplate change, and bilateral facet arthropathy resulting in mild-to-moderate bilateral neural foraminal  stenosis without significant spinal canal stenosis. IMPRESSION: 1. Disc bulge with superiorly migrated left subarticular zone extrusion at L3-L4 resulting in severe spinal canal stenosis with compression of the traversing cauda equina nerve roots. 2. Mild disc bulge with small superiorly migrated right subarticular zone extrusion at L4-L5 resulting in mild spinal canal stenosis and crowding of the subarticular zones but no definite evidence of nerve root impingement. 3. Moderate spinal canal stenosis at L2-L3 with crowding of the cauda equina nerve roots and right subarticular zone. There is possible impingement of the traversing  right L3 nerve root. 4. Multilevel facet arthropathy with effusions, most notable at L4-L5 and worse on the right. 5. Trace anterolisthesis of L3 on L4, likely degenerative in nature. Electronically Signed   By: Valetta Mole M.D.   On: 02/12/2021 13:12   CT Abdomen Pelvis W Contrast  Result Date: 02/13/2021 CLINICAL DATA:  Abdominal pain EXAM: CT ABDOMEN AND PELVIS WITH CONTRAST TECHNIQUE: Multidetector CT imaging of the abdomen and pelvis was performed using the standard protocol following bolus administration of intravenous contrast. CONTRAST:  54mL OMNIPAQUE IOHEXOL 300 MG/ML  SOLN COMPARISON:  None. FINDINGS: Lower chest: No acute abnormality. Hepatobiliary: No suspicious liver lesions. Dilated gallbladder with surrounding inflammatory change and gallstones. No biliary ductal dilation. Pancreas: Unremarkable. No pancreatic ductal dilatation or surrounding inflammatory changes. Spleen: Normal in size without focal abnormality. Adrenals/Urinary Tract: Adrenal glands are unremarkable. No hydronephrosis or nephrolithiasis. Minimally complex cyst of the lower pole of the right kidney (Bosniak II). Simple cyst of the mid region of the left kidney. Stomach/Bowel: Stomach is within normal limits. Appendix is normal. No evidence of bowel wall thickening, distention, or inflammatory changes. CT Vascular/Lymphatic: Aortic atherosclerosis. No enlarged abdominal or pelvic lymph nodes. Reproductive: Mild prostatomegaly. Other: No abdominal wall hernia or abnormality. No abdominopelvic ascites. Musculoskeletal: No acute or significant osseous findings. IMPRESSION: 1. Dilated gallbladder with surrounding inflammatory change and gallstones, findings are compatible with acute cholecystitis. 2.  Aortic Atherosclerosis (ICD10-I70.0). Electronically Signed   By: Yetta Glassman M.D.   On: 02/13/2021 13:19   DG CHEST PORT 1 VIEW  Result Date: 02/14/2021 CLINICAL DATA:  Shortness of breath and cough. EXAM: PORTABLE CHEST 1  VIEW COMPARISON:  01/16/2019 FINDINGS: The cardiomediastinal silhouette is unremarkable. Mild peribronchial thickening is noted. There is no evidence of focal airspace disease, pulmonary edema, suspicious pulmonary nodule/mass, pleural effusion, or pneumothorax. No acute bony abnormalities are identified. IMPRESSION: Mild peribronchial thickening without focal pneumonia. Electronically Signed   By: Margarette Canada M.D.   On: 02/14/2021 16:26   ECHOCARDIOGRAM COMPLETE  Result Date: 02/17/2021    ECHOCARDIOGRAM REPORT   Patient Name:   RIO KIDANE [facility] Date of Exam: 02/17/2021 Medical Rec #:  [id number]        Height:       68.0 in Accession #:    [id number]       Weight:       223.1 lb Date of Birth:  Nov 04, 1940       BSA:          2.141 m Patient Age:    80 years         BP:           160/72 mmHg Patient Gender: M                HR:           73 bpm. Exam Location:  [facility] Procedure: 2D Echo, Cardiac Doppler and Color Doppler Indications:  Other abnormalities of the heart R00.8                 Abnormal ECG R94.31  History:        Patient has prior history of Echocardiogram examinations, most                 recent 01/14/2019. Stroke; Risk Factors:Hypertension and                 Dyslipidemia.  Sonographer:    Alvino Chapel RCS Referring Phys: [id number] [place]  1. Left ventricular ejection fraction, by estimation, is 60 to 65%. The left ventricle has normal function. The left ventricle has no regional wall motion abnormalities. There is mild left ventricular hypertrophy. Left ventricular diastolic parameters are consistent with Grade I diastolic dysfunction (impaired relaxation).  2. Right ventricular systolic function is normal. The right ventricular size is normal. Tricuspid regurgitation signal is inadequate for assessing PA pressure.  3. Left atrial size was mildly dilated.  4. The mitral valve is normal in structure. Trivial mitral valve regurgitation. No evidence of mitral stenosis.  5.  The aortic valve is tricuspid. Aortic valve regurgitation is not visualized. No aortic stenosis is present.  6. The inferior vena cava is dilated in size with >50% respiratory variability, suggesting right atrial pressure of 8 mmHg. FINDINGS  Left Ventricle: Left ventricular ejection fraction, by estimation, is 60 to 65%. The left ventricle has normal function. The left ventricle has no regional wall motion abnormalities. The left ventricular internal cavity size was normal in size. There is  mild left ventricular hypertrophy. Left ventricular diastolic parameters are consistent with Grade I diastolic dysfunction (impaired relaxation). Right Ventricle: The right ventricular size is normal. No increase in right ventricular wall thickness. Right ventricular systolic function is normal. Tricuspid regurgitation signal is inadequate for assessing PA pressure. Left Atrium: Left atrial size was mildly dilated. Right Atrium: Right atrial size was normal in size. Pericardium: There is no evidence of pericardial effusion. Mitral Valve: The mitral valve is normal in structure. Trivial mitral valve regurgitation. No evidence of mitral valve stenosis. Tricuspid Valve: The tricuspid valve is normal in structure. Tricuspid valve regurgitation is trivial. No evidence of tricuspid stenosis. Aortic Valve: The aortic valve is tricuspid. Aortic valve regurgitation is not visualized. No aortic stenosis is present. Pulmonic Valve: The pulmonic valve was grossly normal. Pulmonic valve regurgitation is trivial. No evidence of pulmonic stenosis. Aorta: The aortic root is normal in size and structure. Venous: The inferior vena cava is dilated in size with greater than 50% respiratory variability, suggesting right atrial pressure of 8 mmHg. IAS/Shunts: No atrial level shunt detected by color flow Doppler.  LEFT VENTRICLE PLAX 2D LVIDd:         4.40 cm   Diastology LVIDs:         2.60 cm   LV e' medial:    7.51 cm/s LV PW:         1.30 cm   LV  E/e' medial:  15.0 LV IVS:        1.20 cm   LV e' lateral:   7.83 cm/s LVOT diam:     2.10 cm   LV E/e' lateral: 14.4 LV SV:         76 LV SV Index:   35 LVOT Area:     3.46 cm  RIGHT VENTRICLE RV S prime:     12.20 cm/s TAPSE (M-mode): 2.5 cm LEFT ATRIUM  Index        RIGHT ATRIUM           Index LA diam:        3.80 cm 1.77 cm/m   RA Area:     14.90 cm LA Vol (A2C):   60.8 ml 28.40 ml/m  RA Volume:   35.70 ml  16.67 ml/m LA Vol (A4C):   74.2 ml 34.66 ml/m LA Biplane Vol: 72.3 ml 33.77 ml/m  AORTIC VALVE LVOT Vmax:   102.00 cm/s LVOT Vmean:  67.700 cm/s LVOT VTI:    0.219 m  AORTA Ao Root diam: 3.50 cm MITRAL VALVE MV Area (PHT): 3.65 cm     SHUNTS MV Decel Time: 208 msec     Systemic VTI:  0.22 m MV E velocity: 113.00 cm/s  Systemic Diam: 2.10 cm MV A velocity: 129.00 cm/s MV E/A ratio:  0.88 Cherlynn Kaiser MD Electronically signed by Cherlynn Kaiser MD Signature Date/Time: 02/17/2021/1:28:53 PM    Final    NM HEPATOBILIARY LEAK (POST-SURGICAL)  Result Date: 03/03/2021 CLINICAL DATA:  Postop cholecystectomy pain.  Concern for bile leak. EXAM: NUCLEAR MEDICINE HEPATOBILIARY IMAGING TECHNIQUE: Sequential images of the abdomen were obtained out to 60 minutes following intravenous administration of radiopharmaceutical. RADIOPHARMACEUTICALS:  5.5 mCi Tc-29m  Choletec IV COMPARISON:  CT 03/03/2021 FINDINGS: Prompt clearance radiotracer from blood pool and homogeneous uptake liver. Counts are evident in the common bile duct and proximal small bowel by 15 minutes. Counts continue to progress through the extrahepatic ducts in the more distal small bowel over 60 minutes. There is no evidence of extra biliary excreted radiotracer. No radiotracer in the gallbladder fossa. IMPRESSION: No evidence of bile leak or biloma. Patent common bile duct. Electronically Signed   By: Suzy Bouchard M.D.   On: 03/03/2021 14:56      Subjective: No issues this morning. No abdominal pain.  Discharge  Exam: Vitals:   03/04/21 0410 03/04/21 0813  BP: (!) 169/91   Pulse: 73   Resp: 18   Temp: 99.2 F (37.3 C)   SpO2: 95% 96%   Vitals:   03/03/21 1230 03/03/21 2028 03/04/21 0410 03/04/21 0813  BP: (!) 144/78 (!) 152/69 (!) 169/91   Pulse: 63 69 73   Resp:  18 18   Temp: 98 F (36.7 C) 99 F (37.2 C) 99.2 F (37.3 C)   TempSrc: Oral Oral    SpO2: 94% 95% 95% 96%  Weight:      Height:        General: Pt is alert, awake, not in acute distress Cardiovascular: RRR, S1/S2 +, no rubs, no gallops Respiratory: CTA bilaterally, no wheezing, no rhonchi Abdominal: Soft, minimally tender, ND, bowel sounds + Extremities: no edema, no cyanosis    The results of significant diagnostics from this hospitalization (including imaging, microbiology, ancillary and laboratory) are listed below for reference.     Microbiology: Recent Results (from the past 240 hour(s))  Resp Panel by RT-PCR (Flu A&B, Covid) Nasopharyngeal Swab     Status: None   Collection Time: 03/03/21 12:27 AM   Specimen: Nasopharyngeal Swab; Nasopharyngeal(NP) swabs in vial transport medium  Result Value Ref Range Status   SARS Coronavirus 2 by RT PCR NEGATIVE NEGATIVE Final    Comment: (NOTE) SARS-CoV-2 target nucleic acids are NOT DETECTED.  The SARS-CoV-2 RNA is generally detectable in upper respiratory specimens during the acute phase of infection. The lowest concentration of SARS-CoV-2 viral copies this assay can detect is 138 copies/mL. A  negative result does not preclude SARS-Cov-2 infection and should not be used as the sole basis for treatment or other patient management decisions. A negative result may occur with  improper specimen collection/handling, submission of specimen other than nasopharyngeal swab, presence of viral mutation(s) within the areas targeted by this assay, and inadequate number of viral copies(<138 copies/mL). A negative result must be combined with clinical observations, patient  history, and epidemiological information. The expected result is Negative.  Fact Sheet for Patients:  EntrepreneurPulse.com.au  Fact Sheet for Healthcare Providers:  IncredibleEmployment.be  This test is no t yet approved or cleared by the [place] FDA and  has been authorized for detection and/or diagnosis of SARS-CoV-2 by FDA under an Emergency Use Authorization (EUA). This EUA will remain  in effect (meaning this test can be used) for the duration of the COVID-19 declaration under Section 564(b)(1) of the Act, 21 U.S.C.section 360bbb-3(b)(1), unless the authorization is terminated  or revoked sooner.       Influenza A by PCR NEGATIVE NEGATIVE Final   Influenza B by PCR NEGATIVE NEGATIVE Final    Comment: (NOTE) The Xpert Xpress SARS-CoV-2/FLU/RSV plus assay is intended as an aid in the diagnosis of influenza from Nasopharyngeal swab specimens and should not be used as a sole basis for treatment. Nasal washings and aspirates are unacceptable for Xpert Xpress SARS-CoV-2/FLU/RSV testing.  Fact Sheet for Patients: EntrepreneurPulse.com.au  Fact Sheet for Healthcare Providers: IncredibleEmployment.be  This test is not yet approved or cleared by the [place] FDA and has been authorized for detection and/or diagnosis of SARS-CoV-2 by FDA under an Emergency Use Authorization (EUA). This EUA will remain in effect (meaning this test can be used) for the duration of the COVID-19 declaration under Section 564(b)(1) of the Act, 21 U.S.C. section 360bbb-3(b)(1), unless the authorization is terminated or revoked.  Performed at [facility], [address] [place],  [address]      Labs: BNP (last 3 results) No results for input(s): BNP in the last 8760 hours. Basic Metabolic Panel: Recent Labs  Lab 03/02/21 2158 03/03/21 0414 03/04/21 0420  NA 130* 131* 133*  K 3.9 3.2* 3.3*  CL 98 100  104  CO2 25 24 23   GLUCOSE 121* 105* 87  BUN 17 18 18   CREATININE 1.05 0.91 0.92  CALCIUM 7.9* 7.9* 7.5*  MG  --  1.7 1.9   Liver Function Tests: Recent Labs  Lab 03/02/21 2158 03/03/21 0414  AST 30 25  ALT 46* 40  ALKPHOS 77 73  BILITOT 1.1 1.0  PROT 5.6* 5.5*  ALBUMIN 2.6* 2.5*   Recent Labs  Lab 03/02/21 2158  LIPASE 21   No results for input(s): AMMONIA in the last 168 hours. CBC: Recent Labs  Lab 03/02/21 2158 03/03/21 0414 03/04/21 0420  WBC 13.7* 11.3* 8.0  NEUTROABS  --  8.6* 5.5  HGB 12.3* 11.7* 11.4*  HCT 36.0* 35.0* 32.8*  MCV 90.7 89.7 90.6  PLT 281 267 245   Cardiac Enzymes: No results for input(s): CKTOTAL, CKMB, CKMBINDEX, TROPONINI in the last 168 hours. BNP: Invalid input(s): POCBNP CBG: Recent Labs  Lab 03/02/21 2143  GLUCAP 120*   D-Dimer No results for input(s): DDIMER in the last 72 hours. Hgb A1c No results for input(s): HGBA1C in the last 72 hours. Lipid Profile No results for input(s): CHOL, HDL, LDLCALC, TRIG, CHOLHDL, LDLDIRECT in the last 72 hours. Thyroid function studies No results for input(s): TSH, T4TOTAL, T3FREE, THYROIDAB in the last 72 hours.  Invalid input(s): FREET3 Anemia work up No results for input(s): VITAMINB12, FOLATE, FERRITIN, TIBC, IRON, RETICCTPCT in the last 72 hours. Urinalysis    Component Value Date/Time   COLORURINE YELLOW 02/13/2021 1725   APPEARANCEUR CLEAR 02/13/2021 1725   LABSPEC 1.015 02/13/2021 1725   PHURINE 6.0 02/13/2021 1725   GLUCOSEU NEGATIVE 02/13/2021 1725   HGBUR NEGATIVE 02/13/2021 [place] 02/13/2021 [place] 02/13/2021 1725   PROTEINUR NEGATIVE 02/13/2021 1725   NITRITE NEGATIVE 02/13/2021 1725   LEUKOCYTESUR NEGATIVE 02/13/2021 1725   Sepsis Labs Invalid input(s): PROCALCITONIN,  WBC,  LACTICIDVEN Microbiology Recent Results (from the past 240 hour(s))  Resp Panel by RT-PCR (Flu A&B, Covid) Nasopharyngeal Swab     Status: None    Collection Time: 03/03/21 12:27 AM   Specimen: Nasopharyngeal Swab; Nasopharyngeal(NP) swabs in vial transport medium  Result Value Ref Range Status   SARS Coronavirus 2 by RT PCR NEGATIVE NEGATIVE Final    Comment: (NOTE) SARS-CoV-2 target nucleic acids are NOT DETECTED.  The SARS-CoV-2 RNA is generally detectable in upper respiratory specimens during the acute phase of infection. The lowest concentration of SARS-CoV-2 viral copies this assay can detect is 138 copies/mL. A negative result does not preclude SARS-Cov-2 infection and should not be used as the sole basis for treatment or other patient management decisions. A negative result may occur with  improper specimen collection/handling, submission of specimen other than nasopharyngeal swab, presence of viral mutation(s) within the areas targeted by this assay, and inadequate number of viral copies(<138 copies/mL). A negative result must be combined with clinical observations, patient history, and epidemiological information. The expected result is Negative.  Fact Sheet for Patients:  EntrepreneurPulse.com.au  Fact Sheet for Healthcare Providers:  IncredibleEmployment.be  This test is no t yet approved or cleared by the [place] FDA and  has been authorized for detection and/or diagnosis of SARS-CoV-2 by FDA under an Emergency Use Authorization (EUA). This EUA will remain  in effect (meaning this test can be used) for the duration of the COVID-19 declaration under Section 564(b)(1) of the Act, 21 U.S.C.section 360bbb-3(b)(1), unless the authorization is terminated  or revoked sooner.       Influenza A by PCR NEGATIVE NEGATIVE Final   Influenza B by PCR NEGATIVE NEGATIVE Final    Comment: (NOTE) The Xpert Xpress SARS-CoV-2/FLU/RSV plus assay is intended as an aid in the diagnosis of influenza from Nasopharyngeal swab specimens and should not be used as a sole basis for treatment.  Nasal washings and aspirates are unacceptable for Xpert Xpress SARS-CoV-2/FLU/RSV testing.  Fact Sheet for Patients: EntrepreneurPulse.com.au  Fact Sheet for Healthcare Providers: IncredibleEmployment.be  This test is not yet approved or cleared by the [place] FDA and has been authorized for detection and/or diagnosis of SARS-CoV-2 by FDA under an Emergency Use Authorization (EUA). This EUA will remain in effect (meaning this test can be used) for the duration of the COVID-19 declaration under Section 564(b)(1) of the Act, 21 U.S.C. section 360bbb-3(b)(1), unless the authorization is terminated or revoked.  Performed at [facility], [address] [place], [place] [address]      Time coordinating discharge: 35 minutes  SIGNED:   Cordelia Poche, MD [facility] 03/04/2021, 9:57 AM

## 2021-03-04 NOTE — Progress Notes (Signed)
Nsg Discharge Note  Admit Date:  03/02/2021 Discharge date: 03/04/2021   Derrick Lawrence to be D/C'd Home per MD order.  AVS completed.  Copy for chart, and copy for patient signed, and dated. Patient/caregiver able to verbalize understanding.  Discharge Medication: Allergies as of 03/04/2021   No Known Allergies      Medication List     STOP taking these medications    oxyCODONE 5 MG immediate release tablet Commonly known as: Roxicodone       TAKE these medications    amLODipine 5 MG tablet Commonly known as: NORVASC Take 5 mg by mouth daily.   aspirin 81 MG EC tablet Take 81 mg by mouth daily.   atorvastatin 40 MG tablet Commonly known as: Lipitor Take 1 tablet (40 mg total) by mouth daily.   B-12 2500 MCG Tabs Take 2,500 mcg by mouth daily.   clonazePAM 0.5 MG tablet Commonly known as: KLONOPIN Take 0.5 mg by mouth daily as needed for anxiety.   doxazosin 8 MG tablet Commonly known as: CARDURA Take 1 tablet (8 mg total) by mouth at bedtime. What changed: when to take this   fluticasone 50 MCG/ACT nasal spray Commonly known as: FLONASE Place 2 sprays into the nose daily.   gabapentin 300 MG capsule Commonly known as: NEURONTIN Take 300 mg by mouth 3 (three) times daily as needed (up to 3x a day for burning feet).   metoprolol succinate 100 MG 24 hr tablet Commonly known as: TOPROL-XL Take 100 mg by mouth daily.   potassium chloride SA 20 MEQ tablet Commonly known as: KLOR-CON M Take 1 tablet (20 mEq total) by mouth daily for 3 days. Start taking on: March 05, 2021   sertraline 50 MG tablet Commonly known as: ZOLOFT Take 50 mg by mouth daily.        Discharge Assessment: Vitals:   03/04/21 0410 03/04/21 0813  BP: (!) 169/91   Pulse: 73   Resp: 18   Temp: 99.2 F (37.3 C)   SpO2: 95% 96%   Skin clean, dry and intact without evidence of skin break down, no evidence of skin tears noted. IV catheter discontinued intact. Site  without signs and symptoms of complications - no redness or edema noted at insertion site, patient denies c/o pain - only slight tenderness at site.  Dressing with slight pressure applied.  D/c Instructions-Education: Discharge instructions given to patient/family with verbalized understanding. D/c education completed with patient/family including follow up instructions, medication list, d/c activities limitations if indicated, with other d/c instructions as indicated by MD - patient able to verbalize understanding, all questions fully answered. Patient instructed to return to ED, call 911, or call MD for any changes in condition.  Patient escorted via [place], and D/C home via private auto.  Dorcas Mcmurray, LPN 61/44/3154 00:86 AM

## 2021-03-04 NOTE — Progress Notes (Signed)
  Transition of Care [facility]) Screening Note   Patient Details  Name: Derrick Lawrence Date of Birth: [birth year]   Transition of Care [facility]) CM/SW Contact:    Iona Beard, [place] Phone Number: 03/04/2021, 10:47 AM    Transition of Care [facility]) has reviewed patient and no TOC needs have been identified at this time. We will continue to monitor patient advancement through interdisciplinary progression rounds. If new patient transition needs arise, please place a TOC consult.

## 2021-03-04 NOTE — Discharge Instructions (Addendum)
Derrick Lawrence,  You were in the hospital because you passed out. This may have been related to your blood pressure. Your blood pressure when lying down is high so I have not reduced your doses. Please ensure that you take a few minutes between lying to sitting, in addition to sitting to standing to allow your blood pressure to equilibrate. Please follow-up with your PCP. Regarding your fluid collection, the surgeon does not believe this is an infection and your scan did not show any bile leak, which is great. Please follow-up with your surgeon as previously discussed.

## 2021-03-07 ENCOUNTER — Ambulatory Visit: Payer: PPO | Admitting: Cardiology

## 2024-01-18 ENCOUNTER — Other Ambulatory Visit (HOSPITAL_COMMUNITY): Payer: Self-pay

## 2024-01-18 ENCOUNTER — Other Ambulatory Visit: Payer: Self-pay

## 2024-01-19 ENCOUNTER — Other Ambulatory Visit: Payer: Self-pay

## 2024-01-27 ENCOUNTER — Other Ambulatory Visit: Payer: Self-pay

## 2024-01-27 ENCOUNTER — Other Ambulatory Visit (HOSPITAL_COMMUNITY): Payer: Self-pay

## 2024-01-27 MED ORDER — POTASSIUM CHLORIDE CRYS ER 10 MEQ PO TBCR
10.0000 meq | EXTENDED_RELEASE_TABLET | Freq: Every day | ORAL | 3 refills | Status: AC
  Filled 2024-01-27: qty 90, 90d supply, fill #0
  Filled 2024-02-11: qty 30, 30d supply, fill #0
  Filled 2024-03-21 (×2): qty 30, 30d supply, fill #1
  Filled 2024-04-20: qty 30, 30d supply, fill #2

## 2024-01-27 MED ORDER — HYDROCHLOROTHIAZIDE 12.5 MG PO TABS
12.5000 mg | ORAL_TABLET | Freq: Every day | ORAL | 3 refills | Status: AC
  Filled 2024-01-27: qty 90, 90d supply, fill #0
  Filled 2024-02-11: qty 30, 30d supply, fill #0
  Filled 2024-02-24: qty 90, 90d supply, fill #0
  Filled 2024-02-25: qty 7, 7d supply, fill #0
  Filled 2024-03-21: qty 30, 30d supply, fill #0
  Filled 2024-03-21: qty 90, 90d supply, fill #0
  Filled 2024-04-20: qty 30, 30d supply, fill #1

## 2024-01-27 MED ORDER — AMLODIPINE BESYLATE 10 MG PO TABS
10.0000 mg | ORAL_TABLET | Freq: Every day | ORAL | 3 refills | Status: AC
  Filled 2024-01-27: qty 90, 90d supply, fill #0
  Filled 2024-02-11 – 2024-03-21 (×2): qty 30, 30d supply, fill #0
  Filled 2024-03-21: qty 90, 90d supply, fill #0
  Filled 2024-04-20: qty 30, 30d supply, fill #1

## 2024-01-27 MED ORDER — OMEPRAZOLE 20 MG PO CPDR
20.0000 mg | DELAYED_RELEASE_CAPSULE | Freq: Every day | ORAL | 3 refills | Status: AC
  Filled 2024-01-27: qty 90, 90d supply, fill #0
  Filled 2024-02-11 – 2024-03-21 (×2): qty 30, 30d supply, fill #0
  Filled 2024-03-21: qty 90, 90d supply, fill #0
  Filled 2024-04-20: qty 30, 30d supply, fill #1

## 2024-01-27 MED ORDER — METOPROLOL SUCCINATE ER 100 MG PO TB24
100.0000 mg | ORAL_TABLET | Freq: Every day | ORAL | 3 refills | Status: AC
  Filled 2024-01-27: qty 90, 90d supply, fill #0
  Filled 2024-02-11: qty 30, 30d supply, fill #0
  Filled 2024-03-21 (×2): qty 30, 30d supply, fill #1
  Filled 2024-04-20: qty 30, 30d supply, fill #2

## 2024-01-27 MED ORDER — ATORVASTATIN CALCIUM 40 MG PO TABS
40.0000 mg | ORAL_TABLET | Freq: Every day | ORAL | 3 refills | Status: DC
  Filled 2024-01-27: qty 90, 90d supply, fill #0
  Filled 2024-02-11: qty 30, 30d supply, fill #0
  Filled 2024-03-21: qty 30, 30d supply, fill #1

## 2024-01-27 MED ORDER — ASPIRIN 325 MG PO TABS
325.0000 mg | ORAL_TABLET | Freq: Every day | ORAL | 3 refills | Status: AC
  Filled 2024-01-27: qty 90, 90d supply, fill #0
  Filled 2024-02-11: qty 30, 30d supply, fill #0
  Filled 2024-03-21: qty 30, 30d supply, fill #1
  Filled 2024-04-20: qty 30, 30d supply, fill #2

## 2024-01-27 MED ORDER — SERTRALINE HCL 50 MG PO TABS
50.0000 mg | ORAL_TABLET | Freq: Every day | ORAL | 3 refills | Status: AC
  Filled 2024-01-27: qty 90, 90d supply, fill #0
  Filled 2024-02-11: qty 30, 30d supply, fill #0
  Filled 2024-03-21 (×2): qty 30, 30d supply, fill #1
  Filled 2024-04-20: qty 30, 30d supply, fill #2

## 2024-01-27 MED ORDER — DOXAZOSIN MESYLATE 8 MG PO TABS
8.0000 mg | ORAL_TABLET | Freq: Every day | ORAL | 3 refills | Status: AC
  Filled 2024-01-27: qty 90, 90d supply, fill #0
  Filled 2024-02-11: qty 30, 30d supply, fill #0
  Filled 2024-02-17: qty 90, 90d supply, fill #0
  Filled 2024-02-22 (×2): qty 36, 36d supply, fill #0
  Filled 2024-03-21 (×2): qty 30, 30d supply, fill #1

## 2024-01-28 ENCOUNTER — Other Ambulatory Visit: Payer: Self-pay

## 2024-01-28 ENCOUNTER — Other Ambulatory Visit (HOSPITAL_COMMUNITY): Payer: Self-pay

## 2024-01-28 MED ORDER — VITAMIN B-12 5000 MCG SL SUBL
1.0000 | SUBLINGUAL_TABLET | Freq: Every day | SUBLINGUAL | 3 refills | Status: AC
  Filled 2024-01-28: qty 90, 90d supply, fill #0
  Filled 2024-02-11: qty 30, 30d supply, fill #0
  Filled 2024-03-21: qty 30, 30d supply, fill #1
  Filled 2024-04-20: qty 30, 30d supply, fill #2

## 2024-01-29 ENCOUNTER — Other Ambulatory Visit (HOSPITAL_COMMUNITY): Payer: Self-pay

## 2024-02-01 ENCOUNTER — Other Ambulatory Visit (HOSPITAL_COMMUNITY): Payer: Self-pay

## 2024-02-01 ENCOUNTER — Other Ambulatory Visit: Payer: Self-pay

## 2024-02-01 MED ORDER — OMEPRAZOLE 20 MG PO CPDR
20.0000 mg | DELAYED_RELEASE_CAPSULE | Freq: Every morning | ORAL | 3 refills | Status: DC
  Filled 2024-02-01: qty 90, 90d supply, fill #0

## 2024-02-02 ENCOUNTER — Other Ambulatory Visit (HOSPITAL_COMMUNITY): Payer: Self-pay

## 2024-02-08 NOTE — H&P (Signed)
 TOTAL KNEE ADMISSION H&P  Patient is being admitted for right total knee arthroplasty.  Subjective:  Chief Complaint: Right knee pain.  HPI: Derrick Lawrence [facility], 83 y.o. male has a history of pain and functional disability in the right knee due to arthritis and has failed non-surgical conservative treatments for greater than 12 weeks to include corticosteriod injections and activity modification. Onset of symptoms was gradual, starting >10 years ago with gradually worsening course since that time. The patient noted no past surgery on the right knee.  Patient currently rates pain in the right knee at 8 out of 10 with activity. Patient has night pain, worsening of pain with activity and weight bearing, pain with passive range of motion, and crepitus. Patient has evidence of bone-on-bone arthritis in the medial and patellofemoral compartments with varus deformity and tibial subluxation by imaging studies. There is no active infection.  Patient Active Problem List   Diagnosis Date Noted   Orthostatic hypotension 03/04/2021   Abdominal pain 03/03/2021   Protein calorie malnutrition 03/03/2021   Syncope 03/02/2021   Empyema of gallbladder    NSVT (nonsustained ventricular tachycardia) (HCC) 02/19/2021   Preop cardiovascular exam 02/19/2021   Acute cholecystitis 02/13/2021   Acute CVA (cerebrovascular accident) (HCC) 01/14/2019   CVA (cerebral vascular accident) (HCC) 01/14/2019   Hypokalemia    Hyponatremia    HTN (hypertension) 07/03/2010   ED (erectile dysfunction) 07/03/2010   Hyperlipidemia 07/03/2010   Allergic rhinitis 07/03/2010   Diverticula of colon 07/03/2010   Internal hemorrhoids 07/03/2010    Past Medical History:  Diagnosis Date   ED (erectile dysfunction)    H/O nonmelanoma skin cancer    Hyperlipidemia    Hypertension    Stroke (HCC)    left sided weakness- minimal;    Past Surgical History:  Procedure Laterality Date   CATARACT EXTRACTION Right    CHOLECYSTECTOMY  N/A 02/27/2021   Procedure: LAPAROSCOPIC CHOLECYSTECTOMY;  Surgeon: Mavis Anes, MD;  Location: [facility];  Service: General;  Laterality: N/A;   HERNIA REPAIR     Abdominal   SKIN CANCER EXCISION      Prior to Admission medications   Medication Sig Start Date End Date Taking? Authorizing Provider  amLODipine  (NORVASC ) 10 MG tablet Take 1 tablet (10 mg total) by mouth daily. 01/27/24     amLODipine  (NORVASC ) 5 MG tablet Take 5 mg by mouth daily. 05/06/19   [provider]  aspirin  325 MG tablet Take 1 tablet (325 mg total) by mouth daily. 01/27/24     aspirin  81 MG EC tablet Take 81 mg by mouth daily.    [provider]  atorvastatin  (LIPITOR ) 40 MG tablet Take 1 tablet (40 mg total) by mouth daily. 02/17/21 02/17/22  Antoinette Doe, MD  atorvastatin  (LIPITOR ) 40 MG tablet Take 1 tablet (40 mg total) by mouth daily for cholesterol control. 01/27/24     clonazePAM  (KLONOPIN ) 0.5 MG tablet Take 0.5 mg by mouth daily as needed for anxiety. 06/20/20   [provider]  Cyanocobalamin (B-12) 2500 MCG TABS Take 2,500 mcg by mouth daily.    [provider]  Cyanocobalamin (VITAMIN B-12) 5000 MCG SUBL Place 1 tablet (5,000 mcg total) under the tongue daily. 01/27/24 04/26/24  Anita Bernardino BROCKS, FNP  doxazosin  (CARDURA ) 8 MG tablet Take 1 tablet (8 mg total) by mouth at bedtime. Patient taking differently: Take 8 mg by mouth every morning. 10/26/12   Pennie Elsie PARAS, FNP  doxazosin  (CARDURA ) 8 MG tablet Take 1 tablet (  8 mg total) by mouth daily to improve bladder function. 01/27/24     fluticasone  (FLONASE ) 50 MCG/ACT nasal spray Place 2 sprays into the nose daily. 10/12/12   Pennie Elsie PARAS, FNP  gabapentin  (NEURONTIN ) 300 MG capsule Take 300 mg by mouth 3 (three) times daily as needed (up to 3x a day for burning feet). 02/21/21   [provider]  hydrochlorothiazide  (HYDRODIURIL ) 12.5 MG tablet Take 1 tablet (12.5 mg total) by mouth daily. 01/27/24     metoprolol   succinate (TOPROL -XL) 100 MG 24 hr tablet Take 100 mg by mouth daily. 10/19/18   [provider]  metoprolol  succinate (TOPROL -XL) 100 MG 24 hr tablet Take 1 tablet (100 mg total) by mouth daily for control of blood pressure. 01/27/24     omeprazole (PRILOSEC) 20 MG capsule Take 1 capsule (20 mg total) by mouth daily. 01/27/24     omeprazole (PRILOSEC) 20 MG capsule Take 1 capsule (20 mg total) by mouth in the morning. 12/21/23     potassium chloride  (KLOR-CON  M) 10 MEQ tablet Take 1 tablet (10 mEq total) by mouth daily. 01/27/24     potassium chloride  SA (KLOR-CON  M) 20 MEQ tablet Take 1 tablet (20 mEq total) by mouth daily for 3 days. 03/05/21 03/08/21  Briana Elgin LABOR, MD  sertraline  (ZOLOFT ) 50 MG tablet Take 50 mg by mouth daily. 11/21/20   [provider]  sertraline  (ZOLOFT ) 50 MG tablet Take 1 tablet (50 mg total) by mouth daily. 01/27/24       No Known Allergies  Social History   Socioeconomic History   Marital status: Married    Spouse name: Not on file   Number of children: Not on file   Years of education: Not on file   Highest education level: Not on file  Occupational History   Not on file  Tobacco Use   Smoking status: Never   Smokeless tobacco: Never  Vaping Use   Vaping status: Never Used  Substance and Sexual Activity   Alcohol  use: No   Drug use: No   Sexual activity: Yes  Other Topics Concern   Not on file  Social History Narrative   Lives with wife.  Two children.  Six grands.  One great.  Worked at [employer]    Social Drivers of Health   Financial Resource Strain: Not on file  Food Insecurity: Low Risk  (01/13/2024)   Received from [facility]   Hunger Vital Sign    Within the past 12 months, you worried that your food would run out before you got money to buy more: Never true    Within the past 12 months, the food you bought just didn't last and you didn't have money to get more. : Never true  Transportation Needs: No  Transportation Needs (01/13/2024)   Received from [facility]    In the past 12 months, has lack of reliable transportation kept you from medical appointments, meetings, work or from getting things needed for daily living? : No  Physical Activity: Not on file  Stress: Not on file  Social Connections: Not on file  Intimate Partner Violence: Not on file    Tobacco Use: Low Risk  (01/13/2024)   Received from [facility]   Patient History    Smoking Tobacco Use: Never    Smokeless Tobacco Use: Never    Passive Exposure: Not on file   Social History   Substance and Sexual Activity  Alcohol  Use No    Family History  Problem Relation Age of Onset   Cancer - Lung Mother    Stroke Son     Review of Systems  Constitutional:  Negative for chills and fever.  HENT:  Negative for congestion, sore throat and tinnitus.   Eyes:  Negative for double vision, photophobia and pain.  Respiratory:  Negative for cough, shortness of breath and wheezing.   Cardiovascular:  Negative for chest pain, palpitations and orthopnea.  Gastrointestinal:  Negative for heartburn, nausea and vomiting.  Genitourinary:  Negative for dysuria, frequency and urgency.  Musculoskeletal:  Positive for joint pain.  Neurological:  Negative for dizziness, weakness and headaches.    Objective:  Physical Exam: Well nourished and well developed.  General: Alert and oriented x3, cooperative and pleasant, no acute distress.  Head: normocephalic, atraumatic, neck supple.  Eyes: EOMI.  Musculoskeletal:  Right knee: No effusion. Range of motion is approximately 5 to 120 degrees. Crepitus noted on range of motion. Very tender medially with slight lateral tenderness but no instability.  Calves soft and nontender. Motor function intact in LE. Strength 5/5 LE bilaterally. Neuro: Distal pulses 2+. Sensation to light touch intact in LE.   Imaging Review Plain radiographs demonstrate severe degenerative  joint disease of the right knee. The overall alignment is mild varus. The bone quality appears to be adequate for age and reported activity level.  Assessment/Plan:  End stage arthritis, right knee   The patient history, physical examination, clinical judgment of the provider and imaging studies are consistent with end stage degenerative joint disease of the right knee and total knee arthroplasty is deemed medically necessary. The treatment options including medical management, injection therapy arthroscopy and arthroplasty were discussed at length. The risks and benefits of total knee arthroplasty were presented and reviewed. The risks due to aseptic loosening, infection, stiffness, patella tracking problems, thromboembolic complications and other imponderables were discussed. The patient acknowledged the explanation, agreed to proceed with the plan and consent was signed. Patient is being admitted for inpatient treatment for surgery, pain control, PT, OT, prophylactic antibiotics, VTE prophylaxis, progressive ambulation and ADLs and discharge planning. The patient is planning to be discharged home.   Patient's anticipated LOS is less than 2 midnights, meeting these requirements: - Lives within 1 hour of care - Has a competent adult at home to recover with post-op recover - NO history of  - Chronic pain requiring opiods  - Diabetes  - Coronary Artery Disease  - Heart failure  - Heart attack  - DVT/VTE  - Cardiac arrhythmia  - Respiratory Failure/COPD  - Renal failure  - Anemia  - Advanced Liver disease  Therapy Plans: HHPT followed by OPPT Disposition: Home with daughter Planned DVT Prophylaxis: Xarelto 10 mg QD DME Needed: None PCP: Bernardino Level, FNP (clearance received) TXA: IV Allergies: Gabapentin , bupropion Metal Allergy: None Anesthesia Concerns: None BMI: 39.4 Last HgbA1c: Not diabetic Pain Regimen: Oxycodone  [facility]  Other: - Hx CVA 2020   - Patient  was instructed on what medications to stop prior to surgery. - Follow-up visit in 2 weeks with Dr. Melodi - Begin physical therapy following surgery - Pre-operative lab work as pre-surgical testing - Prescriptions will be provided in hospital at time of discharge  Roxie Mess, PA-C Orthopedic Surgery [facility]

## 2024-02-10 ENCOUNTER — Ambulatory Visit: Attending: Orthopedic Surgery | Admitting: Physical Therapy

## 2024-02-10 ENCOUNTER — Encounter: Payer: Self-pay | Admitting: Physical Therapy

## 2024-02-10 ENCOUNTER — Other Ambulatory Visit: Payer: Self-pay

## 2024-02-10 DIAGNOSIS — M25561 Pain in right knee: Secondary | ICD-10-CM | POA: Insufficient documentation

## 2024-02-10 DIAGNOSIS — G8929 Other chronic pain: Secondary | ICD-10-CM | POA: Insufficient documentation

## 2024-02-10 DIAGNOSIS — R600 Localized edema: Secondary | ICD-10-CM | POA: Insufficient documentation

## 2024-02-10 DIAGNOSIS — M25661 Stiffness of right knee, not elsewhere classified: Secondary | ICD-10-CM | POA: Insufficient documentation

## 2024-02-10 DIAGNOSIS — M6281 Muscle weakness (generalized): Secondary | ICD-10-CM | POA: Insufficient documentation

## 2024-02-10 NOTE — Therapy (Signed)
 OUTPATIENT PHYSICAL THERAPY LOWER EXTREMITY EVALUATION   Patient Name: Derrick Lawrence MRN: [id number] DOB:Aug 09, 1940, 83 y.o., male Today's Date: 02/10/2024  END OF SESSION:  PT End of Session - 02/10/24 1419     Visit Number 1    Number of Visits 1    Date for Recertification  02/10/24    PT Start Time 0144    PT Stop Time 0207    PT Time Calculation (min) 23 min    Activity Tolerance Patient tolerated treatment well;Patient limited by pain    Behavior During Therapy [facility] for tasks assessed/performed          Past Medical History:  Diagnosis Date   ED (erectile dysfunction)    H/O nonmelanoma skin cancer    Hyperlipidemia    Hypertension    Stroke (HCC)    left sided weakness- minimal;   Past Surgical History:  Procedure Laterality Date   CATARACT EXTRACTION Right    CHOLECYSTECTOMY N/A 02/27/2021   Procedure: LAPAROSCOPIC CHOLECYSTECTOMY;  Surgeon: Mavis Anes, MD;  Location: [facility];  Service: General;  Laterality: N/A;   HERNIA REPAIR     Abdominal   SKIN CANCER EXCISION     Patient Active Problem List   Diagnosis Date Noted   Orthostatic hypotension 03/04/2021   Abdominal pain 03/03/2021   Protein calorie malnutrition 03/03/2021   Syncope 03/02/2021   Empyema of gallbladder    NSVT (nonsustained ventricular tachycardia) (HCC) 02/19/2021   Preop cardiovascular exam 02/19/2021   Acute cholecystitis 02/13/2021   Acute CVA (cerebrovascular accident) (HCC) 01/14/2019   CVA (cerebral vascular accident) (HCC) 01/14/2019   Hypokalemia    Hyponatremia    HTN (hypertension) 07/03/2010   ED (erectile dysfunction) 07/03/2010   Hyperlipidemia 07/03/2010   Allergic rhinitis 07/03/2010   Diverticula of colon 07/03/2010   Internal hemorrhoids 07/03/2010   REFERRING PROVIDER: Roxie Mess PA-C  REFERRING DIAG: Pain of right knee joint.    THERAPY DIAG:  Chronic pain of right knee  Stiffness of right knee, not elsewhere classified  Localized  edema  Muscle weakness (generalized)  Rationale for Evaluation and Treatment: Rehabilitation  ONSET DATE: Ongoing (5-6 years).    SUBJECTIVE:   SUBJECTIVE STATEMENT: The patient present to the clinic for a pre-op evaluation prior to a scheduled right total knee replacement for 03/17/24.  He states he will likely receive 2 weeks of home health PT prior to beginning outpatient PT.  We discussed the importance of compliance to his post-hospital discharge HEP.  He rates his pain at a 5-6/10 today and much higher with movement of his right knee and pretty much everything.  Lying down and resting can decrease his pain just a bit.  He describes the pain has an intense burning and even reports some right groin pain and numbness over his right foot.    PERTINENT HISTORY: See above.   PAIN:  Are you having pain? Yes: NPRS scale: 5-6/10.   Pain location: Right knee. Pain description: As above.   Aggravating factors: As above.   Relieving factors: As above.    PRECAUTIONS:   RED FLAGS: None   WEIGHT BEARING RESTRICTIONS: No  FALLS:  Has patient fallen in last 6 months? No  LIVING ENVIRONMENT: Lives in: House/apartment Has following equipment at home: He uses a Hurry cane and prefers it on his right side.   OCCUPATION: Retired.    PLOF: Independent with basic ADLs and Independent with household mobility with device  PATIENT GOALS: Get my knee replaced  and be able to do more.    OBJECTIVE:   PATIENT SURVEYS:  LEFS:  7/80.    EDEMA:  Circumferential: 4 cms greater on right than left.    POSTURE: Right knee genu varum.    PALPATION: C/o diffuse right knee pain and especially medially.    LOWER EXTREMITY ROM:  -10 degrees of right knee extension and flexion limited to 95 degrees.    LOWER EXTREMITY MMT:  The patient was unable to perform an antigravity SLR limited at least in part due to pain.  While seated he was able to perform a LAQ but it was painful..  LOWER  EXTREMITY SPECIAL TESTS:  Severe loss of right patellar mobility in all direction.    GAIT: Very antalgic gait with pronounced right genu varum.                                                                                                                                  TREATMENT DATE:     PATIENT EDUCATION:  Education details: Discussed compliance to HEP and likely outpatient PT progression.   Person educated: Patient Education method: Medical Illustrator Education comprehension: verbalized understanding  HOME EXERCISE PROGRAM:   ASSESSMENT:  CLINICAL IMPRESSION: The patient presents to OPPT for a pre-op evaluation prior to his scheduled right total knee replacement on 03/17/24.  He experiences a lot of pain daily.  His right knee is remarkable for genu varum.  He has severe limitations of right patellar mobility in all direction.  His active range of motion is -10 to 95 degrees.  He is unable to perform an antigravity right SLR.  He has significant edema.  His gait is antalgic in nature and he is using a Hurry cane.  His LEFS score is 7/80.    OBJECTIVE IMPAIRMENTS: Abnormal gait, decreased activity tolerance, difficulty walking, decreased ROM, decreased strength, increased edema, and pain.   ACTIVITY LIMITATIONS: carrying, lifting, bending, sitting, standing, squatting, and locomotion level  PARTICIPATION LIMITATIONS: meal prep, cleaning, laundry, shopping, community activity, and yard work  PERSONAL FACTORS: Time since onset of injury/illness/exacerbation are also affecting patient's functional outcome.   REHAB POTENTIAL: TBD at post-surgical assessment.    CLINICAL DECISION MAKING: Evolving/moderate complexity  EVALUATION COMPLEXITY: Low   GOALS:   SHORT TERM GOALS: Target date: 02/10/24  Complete pre-op evaluation.  Further goals and target dates to been completed following post-surgical evaluation.   Baseline: Goal status: INITIAL  2.   Baseline:   Goal status: INITIAL  3.   Baseline:  Goal status: INITIAL  4.   Baseline:  Goal status: INITIAL  5.   Baseline:  Goal status: INITIAL  6.   Baseline:  Goal status: INITIAL  LONG TERM GOALS: Target date:    Baseline:  Goal status: INITIAL  2.   Baseline:  Goal status: INITIAL  3.   Baseline:  Goal status: INITIAL  4.   Baseline:  Goal status: INITIAL  5.   Baseline:  Goal status: INITIAL  6.   Baseline:  Goal status: INITIAL   PLAN:  PT FREQUENCY/DURATION:    TBD after surgery.    PLANNED INTERVENTIONS: 97110-Therapeutic exercises, 97530- Therapeutic activity, [id number]- Neuromuscular re-education, 97535- Self Care, [id number]- Manual therapy, [id number]- Electrical stimulation (unattended), [id number]- Vasopneumatic device, Patient/Family education, and Cryotherapy  PLAN FOR NEXT SESSION: Plan progression into TKA protocol    Demarea Lorey, PT 02/10/2024, 2:44 PM

## 2024-02-11 ENCOUNTER — Other Ambulatory Visit: Payer: Self-pay

## 2024-02-11 ENCOUNTER — Other Ambulatory Visit (HOSPITAL_COMMUNITY): Payer: Self-pay

## 2024-02-15 ENCOUNTER — Other Ambulatory Visit: Payer: Self-pay

## 2024-02-16 ENCOUNTER — Other Ambulatory Visit: Payer: Self-pay

## 2024-02-17 ENCOUNTER — Other Ambulatory Visit: Payer: Self-pay

## 2024-02-17 ENCOUNTER — Other Ambulatory Visit (HOSPITAL_COMMUNITY): Payer: Self-pay

## 2024-02-22 ENCOUNTER — Other Ambulatory Visit (HOSPITAL_COMMUNITY): Payer: Self-pay

## 2024-02-22 ENCOUNTER — Other Ambulatory Visit: Payer: Self-pay

## 2024-02-24 ENCOUNTER — Other Ambulatory Visit: Payer: Self-pay

## 2024-02-24 ENCOUNTER — Other Ambulatory Visit (HOSPITAL_COMMUNITY): Payer: Self-pay

## 2024-02-25 ENCOUNTER — Other Ambulatory Visit: Payer: Self-pay

## 2024-02-25 NOTE — Progress Notes (Addendum)
 PCP - Bernardino Level, FNP LOV 01-13-24 epic  clearance media 12-24-23 Cardiologist - n/a  PPM/ICD -  Device Orders -  Rep Notified -   Chest x-ray -  EKG - preop Stress Test -  ECHO - 2022 epic Cardiac Cath -   Sleep Study - n/a CPAP -   Fasting Blood Sugar -  Checks Blood Sugar ___n/a__ times a day  Blood Thinner Instructions: Aspirin  Instructions: 325mg  asa 5 days   ERAS Protcol - PRE-SURGERY Ensure     COVID vaccine -yes  Activity--Able to complete with no CO por SOB. Walks with walker   Anesthesia review: HTN, Stroke 2020  minimal Left side weakness in his hand   Patient denies shortness of breath, fever, cough and chest pain at PAT appointment   All instructions explained to the patient, with a verbal understanding of the material. Patient agrees to go over the instructions while at home for a better understanding. Patient also instructed to self quarantine after being tested for COVID-19. The opportunity to ask questions was provided.

## 2024-02-25 NOTE — Patient Instructions (Signed)
 SURGICAL WAITING ROOM VISITATION  Patients having surgery or a procedure may have no more than 2 support people in the waiting area - these visitors may rotate.    Children under the age of 66 must have an adult with them who is not the patient.  Visitors with respiratory illnesses are discouraged from visiting and should remain at home.  If the patient needs to stay at the hospital during part of their recovery, the visitor guidelines for inpatient rooms apply. Pre-op nurse will coordinate an appropriate time for 1 support person to accompany patient in pre-op.  This support person may not rotate.    Please refer to the [facility] website for the visitor guidelines for Inpatients (after your surgery is over and you are in a regular room).       Your procedure is scheduled on: 03-07-24    Report to [facility] Main Entrance    Report to admitting at        0800  AM   Call this number if you have problems the morning of surgery [phone]   Do not eat food :After Midnight.   After Midnight you may have the following liquids until _0730_____ AM/ DAY OF SURGERY   then nothing by mouth  Water Non-Citrus Juices (without pulp, NO RED-Apple, White grape, White cranberry) Black Coffee (NO MILK/CREAM OR CREAMERS, sugar ok)  Clear Tea (NO MILK/CREAM OR CREAMERS, sugar ok) regular and decaf                             Plain Jell-O (NO RED)                                           Fruit ices (not with fruit pulp, NO RED)                                     Popsicles (NO RED)                                                               Sports drinks like Gatorade (NO RED)              .        The day of surgery:  Drink ONE (1) Pre-Surgery Clear Ensure BY   0730  AM the morning of surgery. Drink in one sitting. Do not sip.  This drink was given to you during your hospital  pre-op appointment visit. Nothing else to drink after completing the  Pre-Surgery Clear Ensure .           If you have questions, please contact your surgeon's office.   FOLLOW  ANY ADDITIONAL PRE OP INSTRUCTIONS YOU RECEIVED FROM YOUR SURGEON'S OFFICE!!!     Oral Hygiene is also important to reduce your risk of infection.                                    Remember - BRUSH  YOUR TEETH THE MORNING OF SURGERY WITH YOUR REGULAR TOOTHPASTE  DENTURES WILL BE REMOVED PRIOR TO SURGERY PLEASE DO NOT APPLY Poly grip OR ADHESIVES!!!   Do NOT smoke after Midnight   Stop all vitamins and herbal supplements 7 days before surgery.   Take these medicines the morning of surgery with A SIP OF WATER: Sertraline , omeprazole , metoprolol , doxazosin , amlodipine   DO NOT TAKE ANY ORAL DIABETIC MEDICATIONS DAY OF YOUR SURGERY  Bring CPAP mask and tubing day of surgery.                              You may not have any metal on your body including hair pins, jewelry, and body piercing             Do not wear, lotions, powders, cologne, or deodorant               Men may shave face and neck.   Do not bring valuables to the hospital. [facility] IS NOT             RESPONSIBLE   FOR VALUABLES.   Contacts, glasses, dentures or bridgework may not be worn into surgery.   Bring small overnight bag day of surgery.   DO NOT BRING YOUR HOME MEDICATIONS TO THE HOSPITAL. PHARMACY WILL DISPENSE MEDICATIONS LISTED ON YOUR MEDICATION LIST TO YOU DURING YOUR ADMISSION IN THE HOSPITAL!    Patients discharged on the day of surgery will not be allowed to drive home.  Someone NEEDS to stay with you for the first 24 hours after anesthesia.   Special Instructions: Bring a copy of your healthcare power of attorney and living will documents the day of surgery if you haven't scanned them before.              Please read over the following fact sheets you were given: IF YOU HAVE QUESTIONS ABOUT YOUR PRE-OP INSTRUCTIONS PLEASE CALL [phone].    If you test positive for Covid or have been in contact with anyone that  has tested positive in the last 10 days please notify you surgeon.      Pre-operative 4 CHG Bath Instructions  DYNA-Hex 4 Chlorhexidine  Gluconate 4% Solution Antiseptic 4 fl. oz   You can play a key role in reducing the risk of infection after surgery. Your skin needs to be as free of germs as possible. You can reduce the number of germs on your skin by washing with CHG (chlorhexidine  gluconate) soap before surgery. CHG is an antiseptic soap that kills germs and continues to kill germs even after washing.   DO NOT use if you have an allergy to chlorhexidine /CHG or antibacterial soaps. If your skin becomes reddened or irritated, stop using the CHG and notify one of our RNs at   Please shower with the CHG soap starting 4 days before surgery using the following schedule:     Please keep in mind the following:  DO NOT shave, including legs and underarms, starting the day of your first shower.   You may shave your face at any point before/day of surgery.  Place clean sheets on your bed the day you start using CHG soap. Use a clean washcloth (not used since being washed) for each shower. DO NOT sleep with pets once you start using the CHG.  CHG Shower Instructions:  If you choose to wash your hair and private area, wash first with your normal  shampoo/soap.  After you use shampoo/soap, rinse your hair and body thoroughly to remove shampoo/soap residue.  Turn the water OFF and apply about 3 tablespoons (45 ml) of CHG soap to a CLEAN washcloth.  Apply CHG soap ONLY FROM YOUR NECK DOWN TO YOUR TOES (washing for 3-5 minutes)  DO NOT use CHG soap on face, private areas, open wounds, or sores.  Pay special attention to the area where your surgery is being performed.  If you are having back surgery, having someone wash your back for you may be helpful. Wait 2 minutes after CHG soap is applied, then you may rinse off the CHG soap.  Pat dry with a clean towel  Put on clean clothes/pajamas   If you  choose to wear lotion, please use ONLY the CHG-compatible lotions on the back of this paper.     Additional instructions for the day of surgery: DO NOT APPLY any lotions, deodorants, cologne, or perfumes.   Put on clean/comfortable clothes.  Brush your teeth.  Ask your nurse before applying any prescription medications to the skin.   CHG Compatible Lotions   Aveeno Moisturizing lotion  Cetaphil Moisturizing Cream  Cetaphil Moisturizing Lotion  Clairol Herbal Essence Moisturizing Lotion, Dry Skin  Clairol Herbal Essence Moisturizing Lotion, Extra Dry Skin  Clairol Herbal Essence Moisturizing Lotion, Normal Skin  Curel Age Defying Therapeutic Moisturizing Lotion with Alpha Hydroxy  Curel Extreme Care Body Lotion  Curel Soothing Hands Moisturizing Hand Lotion  Curel Therapeutic Moisturizing Cream, Fragrance-Free  Curel Therapeutic Moisturizing Lotion, Fragrance-Free  Curel Therapeutic Moisturizing Lotion, Original Formula  Eucerin Daily Replenishing Lotion  Eucerin Dry Skin Therapy Plus Alpha Hydroxy Crme  Eucerin Dry Skin Therapy Plus Alpha Hydroxy Lotion  Eucerin Original Crme  Eucerin Original Lotion  Eucerin Plus Crme Eucerin Plus Lotion  Eucerin TriLipid Replenishing Lotion  Keri Anti-Bacterial Hand Lotion  Keri Deep Conditioning Original Lotion Dry Skin Formula Softly Scented  Keri Deep Conditioning Original Lotion, Fragrance Free Sensitive Skin Formula  Keri Lotion Fast Absorbing Fragrance Free Sensitive Skin Formula  Keri Lotion Fast Absorbing Softly Scented Dry Skin Formula  Keri Original Lotion  Keri Skin Renewal Lotion Keri Silky Smooth Lotion  Keri Silky Smooth Sensitive Skin Lotion  Nivea Body Creamy Conditioning Oil  Nivea Body Extra Enriched Lotion  Nivea Body Original Lotion  Nivea Body Sheer Moisturizing Lotion Nivea Crme  Nivea Skin Firming Lotion  NutraDerm 30 Skin Lotion  NutraDerm Skin Lotion  NutraDerm Therapeutic Skin Cream  NutraDerm Therapeutic  Skin Lotion  ProShield Protective Hand Cream   Incentive Spirometer  An incentive spirometer is a tool that can help keep your lungs clear and active. This tool measures how well you are filling your lungs with each breath. Taking long deep breaths may help reverse or decrease the chance of developing breathing (pulmonary) problems (especially infection) following: A long period of time when you are unable to move or be active. BEFORE THE PROCEDURE  If the spirometer includes an indicator to show your best effort, your nurse or respiratory therapist will set it to a desired goal. If possible, sit up straight or lean slightly forward. Try not to slouch. Hold the incentive spirometer in an upright position. INSTRUCTIONS FOR USE  Sit on the edge of your bed if possible, or sit up as far as you can in bed or on a chair. Hold the incentive spirometer in an upright position. Breathe out normally. Place the mouthpiece in your mouth and seal your lips tightly around  it. Breathe in slowly and as deeply as possible, raising the piston or the ball toward the top of the column. Hold your breath for 3-5 seconds or for as long as possible. Allow the piston or ball to fall to the bottom of the column. Remove the mouthpiece from your mouth and breathe out normally. Rest for a few seconds and repeat Steps 1 through 7 at least 10 times every 1-2 hours when you are awake. Take your time and take a few normal breaths between deep breaths. The spirometer may include an indicator to show your best effort. Use the indicator as a goal to work toward during each repetition. After each set of 10 deep breaths, practice coughing to be sure your lungs are clear. If you have an incision (the cut made at the time of surgery), support your incision when coughing by placing a pillow or rolled up towels firmly against it. Once you are able to get out of bed, walk around indoors and cough well. You may stop using the incentive  spirometer when instructed by your caregiver.  RISKS AND COMPLICATIONS Take your time so you do not get dizzy or light-headed. If you are in pain, you may need to take or ask for pain medication before doing incentive spirometry. It is harder to take a deep breath if you are having pain. AFTER USE Rest and breathe slowly and easily. It can be helpful to keep track of a log of your progress. Your caregiver can provide you with a simple table to help with this. If you are using the spirometer at home, follow these instructions: SEEK MEDICAL CARE IF:  You are having difficultly using the spirometer. You have trouble using the spirometer as often as instructed. Your pain medication is not giving enough relief while using the spirometer. You develop fever of 100.5 F (38.1 C) or higher. SEEK IMMEDIATE MEDICAL CARE IF:  You cough up bloody sputum that had not been present before. You develop fever of 102 F (38.9 C) or greater. You develop worsening pain at or near the incision site. MAKE SURE YOU:  Understand these instructions. Will watch your condition. Will get help right away if you are not doing well or get worse. Document Released: 07/21/2006 Document Revised: 06/02/2011 Document Reviewed: 09/21/2006 [facility] Patient Information 2014 [place], [place].   ________________________________________________________________________

## 2024-02-26 ENCOUNTER — Other Ambulatory Visit: Payer: Self-pay

## 2024-02-26 ENCOUNTER — Encounter (HOSPITAL_COMMUNITY): Payer: Self-pay

## 2024-02-26 ENCOUNTER — Encounter (HOSPITAL_COMMUNITY)
Admission: RE | Admit: 2024-02-26 | Discharge: 2024-02-26 | Disposition: A | Source: Ambulatory Visit | Attending: Anesthesiology

## 2024-02-26 VITALS — Ht 70.0 in

## 2024-02-26 DIAGNOSIS — Z01818 Encounter for other preprocedural examination: Secondary | ICD-10-CM

## 2024-02-26 DIAGNOSIS — I10 Essential (primary) hypertension: Secondary | ICD-10-CM

## 2024-02-26 HISTORY — DX: Depression, unspecified: F32.A

## 2024-02-26 HISTORY — DX: Unspecified osteoarthritis, unspecified site: M19.90

## 2024-02-26 HISTORY — DX: Gastro-esophageal reflux disease without esophagitis: K21.9

## 2024-02-26 HISTORY — DX: Pneumonia, unspecified organism: J18.9

## 2024-02-29 ENCOUNTER — Other Ambulatory Visit (HOSPITAL_COMMUNITY): Payer: Self-pay

## 2024-02-29 ENCOUNTER — Other Ambulatory Visit: Payer: Self-pay

## 2024-03-01 ENCOUNTER — Encounter (HOSPITAL_COMMUNITY): Admission: RE | Admit: 2024-03-01

## 2024-03-01 ENCOUNTER — Other Ambulatory Visit (HOSPITAL_COMMUNITY): Payer: Self-pay

## 2024-03-02 ENCOUNTER — Other Ambulatory Visit (HOSPITAL_COMMUNITY): Payer: Self-pay

## 2024-03-02 ENCOUNTER — Encounter (HOSPITAL_COMMUNITY)
Admission: RE | Admit: 2024-03-02 | Discharge: 2024-03-02 | Disposition: A | Discharge status: Home / Self Care | Source: Ambulatory Visit | Attending: Orthopedic Surgery | Admitting: Orthopedic Surgery

## 2024-03-02 DIAGNOSIS — Z01818 Encounter for other preprocedural examination: Secondary | ICD-10-CM | POA: Insufficient documentation

## 2024-03-02 DIAGNOSIS — I10 Essential (primary) hypertension: Secondary | ICD-10-CM | POA: Insufficient documentation

## 2024-03-02 LAB — CBC
HCT: 44.9 % (ref 39.0–52.0)
Hemoglobin: 15.3 g/dL (ref 13.0–17.0)
MCH: 30 pg (ref 26.0–34.0)
MCHC: 34.1 g/dL (ref 30.0–36.0)
MCV: 88 fL (ref 80.0–100.0)
Platelets: 208 K/uL (ref 150–400)
RBC: 5.1 MIL/uL (ref 4.22–5.81)
RDW: 12.9 % (ref 11.5–15.5)
WBC: 7.4 K/uL (ref 4.0–10.5)
nRBC: 0 % (ref 0.0–0.2)

## 2024-03-02 LAB — SURGICAL PCR SCREEN
MRSA, PCR: NEGATIVE
Staphylococcus aureus: NEGATIVE

## 2024-03-07 ENCOUNTER — Ambulatory Visit (HOSPITAL_COMMUNITY): Admitting: Anesthesiology

## 2024-03-07 ENCOUNTER — Encounter (HOSPITAL_COMMUNITY): Payer: Self-pay | Admitting: Orthopedic Surgery

## 2024-03-07 ENCOUNTER — Encounter (HOSPITAL_COMMUNITY): Admission: RE | Disposition: A | Payer: Self-pay | Source: Ambulatory Visit | Attending: Orthopedic Surgery

## 2024-03-07 ENCOUNTER — Other Ambulatory Visit: Payer: Self-pay

## 2024-03-07 ENCOUNTER — Observation Stay (HOSPITAL_COMMUNITY)
Admission: RE | Admit: 2024-03-07 | Discharge: 2024-03-08 | Disposition: A | Attending: Orthopedic Surgery | Admitting: Orthopedic Surgery

## 2024-03-07 DIAGNOSIS — Z01818 Encounter for other preprocedural examination: Secondary | ICD-10-CM

## 2024-03-07 DIAGNOSIS — Z7982 Long term (current) use of aspirin: Secondary | ICD-10-CM | POA: Diagnosis not present

## 2024-03-07 DIAGNOSIS — Z79899 Other long term (current) drug therapy: Secondary | ICD-10-CM | POA: Diagnosis not present

## 2024-03-07 DIAGNOSIS — E785 Hyperlipidemia, unspecified: Secondary | ICD-10-CM | POA: Diagnosis not present

## 2024-03-07 DIAGNOSIS — M1711 Unilateral primary osteoarthritis, right knee: Secondary | ICD-10-CM | POA: Diagnosis present

## 2024-03-07 DIAGNOSIS — I10 Essential (primary) hypertension: Secondary | ICD-10-CM | POA: Diagnosis not present

## 2024-03-07 DIAGNOSIS — F32A Depression, unspecified: Secondary | ICD-10-CM | POA: Diagnosis not present

## 2024-03-07 HISTORY — PX: TOTAL KNEE ARTHROPLASTY: SHX125

## 2024-03-07 LAB — BASIC METABOLIC PANEL WITH GFR
Anion gap: 8 (ref 5–15)
BUN: 11 mg/dL (ref 8–23)
CO2: 26 mmol/L (ref 22–32)
Calcium: 8 mg/dL — ABNORMAL LOW (ref 8.9–10.3)
Chloride: 105 mmol/L (ref 98–111)
Creatinine, Ser: 0.86 mg/dL (ref 0.61–1.24)
GFR, Estimated: >60 mL/min (ref >60)
Glucose, Bld: 125 mg/dL — ABNORMAL HIGH (ref 70–99)
Potassium: 3.9 mmol/L (ref 3.5–5.1)
Sodium: 138 mmol/L (ref 135–145)

## 2024-03-07 LAB — POCT I-STAT EG7
Acid-Base Excess: 1 mmol/L (ref 0.0–2.0)
Bicarbonate: 25 mmol/L (ref 20.0–28.0)
Calcium, Ion: 1.07 mmol/L — ABNORMAL LOW (ref 1.15–1.40)
HCT: 44 % (ref 39.0–52.0)
Hemoglobin: 15 g/dL (ref 13.0–17.0)
O2 Saturation: 94 %
Patient temperature: 37
Potassium: 3.5 mmol/L (ref 3.5–5.1)
Sodium: 139 mmol/L (ref 135–145)
TCO2: 26 mmol/L (ref 22–32)
pCO2, Ven: 37.9 mmHg — ABNORMAL LOW (ref 44–60)
pH, Ven: 7.426 (ref 7.25–7.43)
pO2, Ven: 69 mmHg — ABNORMAL HIGH (ref 32–45)

## 2024-03-07 SURGERY — ARTHROPLASTY, KNEE, TOTAL
Anesthesia: Regional | Site: Knee | Laterality: Right

## 2024-03-07 MED ORDER — SODIUM CHLORIDE (PF) 0.9 % IJ SOLN
INTRAMUSCULAR | Status: DC | PRN
  Administered 2024-03-07: 11:00:00 60 mL

## 2024-03-07 MED ORDER — ORAL CARE MOUTH RINSE: 15.0000 mL | Freq: Once | OROMUCOSAL | Status: AC

## 2024-03-07 MED ORDER — CEFAZOLIN SODIUM-DEXTROSE 2-4 GM/100ML-% IV SOLN
2.0000 g | Freq: Four times a day (QID) | INTRAVENOUS | Status: AC
  Administered 2024-03-07 (×2): 2 g via INTRAVENOUS
  Filled 2024-03-07 (×2): qty 100

## 2024-03-07 MED ORDER — DIPHENHYDRAMINE HCL 12.5 MG/5ML PO ELIX: 12.5000 mg | ORAL_SOLUTION | ORAL | Status: DC | PRN

## 2024-03-07 MED ORDER — CEFAZOLIN SODIUM-DEXTROSE 2-4 GM/100ML-% IV SOLN
2.0000 g | INTRAVENOUS | Status: AC
  Administered 2024-03-07: 11:00:00 2 g via INTRAVENOUS
  Filled 2024-03-07: qty 100

## 2024-03-07 MED ORDER — SERTRALINE HCL 50 MG PO TABS
50.0000 mg | ORAL_TABLET | Freq: Every day | ORAL | Status: DC
  Administered 2024-03-08: 08:00:00 50 mg via ORAL
  Filled 2024-03-07: qty 1

## 2024-03-07 MED ORDER — METOCLOPRAMIDE HCL 5 MG/ML IJ SOLN: 5.0000 mg | Freq: Three times a day (TID) | INTRAMUSCULAR | Status: DC | PRN

## 2024-03-07 MED ORDER — METHOCARBAMOL 500 MG PO TABS
500.0000 mg | ORAL_TABLET | Freq: Four times a day (QID) | ORAL | Status: DC | PRN
  Administered 2024-03-07 – 2024-03-08 (×2): 500 mg via ORAL
  Filled 2024-03-07 (×2): qty 1

## 2024-03-07 MED ORDER — RIVAROXABAN 10 MG PO TABS
10.0000 mg | ORAL_TABLET | Freq: Every day | ORAL | Status: DC
  Administered 2024-03-08: 08:00:00 10 mg via ORAL
  Filled 2024-03-07: qty 1

## 2024-03-07 MED ORDER — HYDROMORPHONE HCL 1 MG/ML IJ SOLN: 0.5000 mg | INTRAMUSCULAR | Status: DC | PRN

## 2024-03-07 MED ORDER — POLYETHYLENE GLYCOL 3350 17 G PO PACK: 17.0000 g | PACK | Freq: Every day | ORAL | Status: DC | PRN

## 2024-03-07 MED ORDER — DOCUSATE SODIUM 100 MG PO CAPS
100.0000 mg | ORAL_CAPSULE | Freq: Two times a day (BID) | ORAL | Status: DC
  Administered 2024-03-07 – 2024-03-08 (×2): 100 mg via ORAL
  Filled 2024-03-07 (×2): qty 1

## 2024-03-07 MED ORDER — ACETAMINOPHEN 10 MG/ML IV SOLN
1000.0000 mg | Freq: Four times a day (QID) | INTRAVENOUS | Status: DC
  Administered 2024-03-07: 12:00:00 1000 mg via INTRAVENOUS
  Filled 2024-03-07: qty 100

## 2024-03-07 MED ORDER — BISACODYL 10 MG RE SUPP: 10.0000 mg | Freq: Every day | RECTAL | Status: DC | PRN

## 2024-03-07 MED ORDER — PROPOFOL 500 MG/50ML IV EMUL
INTRAVENOUS | Status: AC
  Filled 2024-03-07: qty 50

## 2024-03-07 MED ORDER — LACTATED RINGERS IV SOLN: INTRAVENOUS | Status: DC

## 2024-03-07 MED ORDER — SODIUM CHLORIDE (PF) 0.9 % IJ SOLN
INTRAMUSCULAR | Status: AC
  Filled 2024-03-07: qty 10

## 2024-03-07 MED ORDER — DEXAMETHASONE SOD PHOSPHATE PF 10 MG/ML IJ SOLN
10.0000 mg | Freq: Once | INTRAMUSCULAR | Status: AC
  Administered 2024-03-08: 08:00:00 10 mg via INTRAVENOUS

## 2024-03-07 MED ORDER — ONDANSETRON HCL 4 MG/2ML IJ SOLN
INTRAMUSCULAR | Status: AC
  Filled 2024-03-07: qty 2

## 2024-03-07 MED ORDER — BUPIVACAINE LIPOSOME 1.3 % IJ SUSP: 20.0000 mL | Freq: Once | INTRAMUSCULAR | Status: DC

## 2024-03-07 MED ORDER — FENTANYL CITRATE (PF) 50 MCG/ML IJ SOSY
PREFILLED_SYRINGE | INTRAMUSCULAR | Status: AC
  Administered 2024-03-07: 10:00:00 50 ug via INTRAVENOUS
  Filled 2024-03-07: qty 2

## 2024-03-07 MED ORDER — METHOCARBAMOL 1000 MG/10ML IJ SOLN: 500.0000 mg | Freq: Four times a day (QID) | INTRAMUSCULAR | Status: DC | PRN

## 2024-03-07 MED ORDER — FENTANYL CITRATE (PF) 50 MCG/ML IJ SOSY: 25.0000 ug | PREFILLED_SYRINGE | INTRAMUSCULAR | Status: DC | PRN

## 2024-03-07 MED ORDER — SODIUM CHLORIDE (PF) 0.9 % IJ SOLN
INTRAMUSCULAR | Status: AC
  Filled 2024-03-07: qty 50

## 2024-03-07 MED ORDER — POVIDONE-IODINE 10 % EX SWAB
2.0000 | Freq: Once | CUTANEOUS | Status: AC
  Administered 2024-03-07: 08:00:00 2 via TOPICAL

## 2024-03-07 MED ORDER — AMLODIPINE BESYLATE 10 MG PO TABS
10.0000 mg | ORAL_TABLET | Freq: Every day | ORAL | Status: DC
  Administered 2024-03-08: 08:00:00 10 mg via ORAL
  Filled 2024-03-07: qty 1

## 2024-03-07 MED ORDER — PHENOL 1.4 % MT LIQD: 1.0000 | OROMUCOSAL | Status: DC | PRN

## 2024-03-07 MED ORDER — METOPROLOL SUCCINATE ER 50 MG PO TB24
100.0000 mg | ORAL_TABLET | Freq: Every day | ORAL | Status: DC
  Administered 2024-03-08: 08:00:00 100 mg via ORAL
  Filled 2024-03-07: qty 2

## 2024-03-07 MED ORDER — ATORVASTATIN CALCIUM 40 MG PO TABS
40.0000 mg | ORAL_TABLET | Freq: Every day | ORAL | Status: DC
  Administered 2024-03-08: 08:00:00 40 mg via ORAL
  Filled 2024-03-07: qty 1

## 2024-03-07 MED ORDER — SODIUM CHLORIDE 0.9 % IV SOLN: INTRAVENOUS | Status: DC

## 2024-03-07 MED ORDER — PROPOFOL 10 MG/ML IV BOLUS
INTRAVENOUS | Status: DC | PRN
  Administered 2024-03-07: 11:00:00 30 mg via INTRAVENOUS
  Administered 2024-03-07: 11:00:00 20 mg via INTRAVENOUS

## 2024-03-07 MED ORDER — BUPIVACAINE LIPOSOME 1.3 % IJ SUSP
INTRAMUSCULAR | Status: DC | PRN
  Administered 2024-03-07: 11:00:00 20 mL

## 2024-03-07 MED ORDER — OXYCODONE HCL 5 MG PO TABS
5.0000 mg | ORAL_TABLET | ORAL | Status: DC | PRN
  Administered 2024-03-07 – 2024-03-08 (×7): 5 mg via ORAL
  Filled 2024-03-07 (×2): qty 1
  Filled 2024-03-07: qty 2
  Filled 2024-03-07 (×4): qty 1

## 2024-03-07 MED ORDER — ONDANSETRON HCL 4 MG/2ML IJ SOLN: 4.0000 mg | Freq: Once | INTRAMUSCULAR | Status: DC | PRN

## 2024-03-07 MED ORDER — 0.9 % SODIUM CHLORIDE (POUR BTL) OPTIME
TOPICAL | Status: DC | PRN
  Administered 2024-03-07: 11:00:00 1000 mL

## 2024-03-07 MED ORDER — CHLORHEXIDINE GLUCONATE 0.12 % MT SOLN
15.0000 mL | Freq: Once | OROMUCOSAL | Status: AC
  Administered 2024-03-07: 08:00:00 15 mL via OROMUCOSAL

## 2024-03-07 MED ORDER — TRANEXAMIC ACID-NACL 1000-0.7 MG/100ML-% IV SOLN
1000.0000 mg | INTRAVENOUS | Status: AC
  Administered 2024-03-07: 11:00:00 1000 mg via INTRAVENOUS
  Filled 2024-03-07: qty 100

## 2024-03-07 MED ORDER — POTASSIUM CHLORIDE CRYS ER 10 MEQ PO TBCR
10.0000 meq | EXTENDED_RELEASE_TABLET | Freq: Every day | ORAL | Status: DC
  Administered 2024-03-08: 08:00:00 10 meq via ORAL
  Filled 2024-03-07: qty 1

## 2024-03-07 MED ORDER — PROPOFOL 500 MG/50ML IV EMUL
INTRAVENOUS | Status: DC | PRN
  Administered 2024-03-07: 11:00:00 50 ug/kg/min via INTRAVENOUS

## 2024-03-07 MED ORDER — AMISULPRIDE (ANTIEMETIC) 5 MG/2ML IV SOLN: 10.0000 mg | Freq: Once | INTRAVENOUS | Status: DC | PRN

## 2024-03-07 MED ORDER — OXYCODONE HCL 5 MG PO TABS: 10.0000 mg | ORAL_TABLET | ORAL | Status: DC | PRN

## 2024-03-07 MED ORDER — FENTANYL CITRATE (PF) 50 MCG/ML IJ SOSY: 50.0000 ug | PREFILLED_SYRINGE | INTRAMUSCULAR | Status: DC

## 2024-03-07 MED ORDER — BUPIVACAINE IN DEXTROSE 0.75-8.25 % IT SOLN
INTRATHECAL | Status: DC | PRN
  Administered 2024-03-07: 11:00:00 1.6 mL via INTRATHECAL

## 2024-03-07 MED ORDER — PANTOPRAZOLE SODIUM 40 MG PO TBEC
40.0000 mg | DELAYED_RELEASE_TABLET | Freq: Every day | ORAL | Status: DC
  Administered 2024-03-07 – 2024-03-08 (×2): 40 mg via ORAL
  Filled 2024-03-07 (×2): qty 1

## 2024-03-07 MED ORDER — STERILE WATER FOR IRRIGATION IR SOLN
Status: DC | PRN
  Administered 2024-03-07: 11:00:00 2000 mL

## 2024-03-07 MED ORDER — ONDANSETRON HCL 4 MG/2ML IJ SOLN: 4.0000 mg | Freq: Four times a day (QID) | INTRAMUSCULAR | Status: DC | PRN

## 2024-03-07 MED ORDER — SODIUM CHLORIDE 0.9 % IR SOLN
Status: DC | PRN
  Administered 2024-03-07: 12:00:00 1000 mL

## 2024-03-07 MED ORDER — ONDANSETRON HCL 4 MG/2ML IJ SOLN
INTRAMUSCULAR | Status: DC | PRN
  Administered 2024-03-07: 12:00:00 4 mg via INTRAVENOUS

## 2024-03-07 MED ORDER — BUPIVACAINE LIPOSOME 1.3 % IJ SUSP
INTRAMUSCULAR | Status: AC
  Filled 2024-03-07: qty 20

## 2024-03-07 MED ORDER — MENTHOL 3 MG MT LOZG: 1.0000 | LOZENGE | OROMUCOSAL | Status: DC | PRN

## 2024-03-07 MED ORDER — ONDANSETRON HCL 4 MG PO TABS: 4.0000 mg | ORAL_TABLET | Freq: Four times a day (QID) | ORAL | Status: DC | PRN

## 2024-03-07 MED ORDER — BUPIVACAINE-EPINEPHRINE (PF) 0.5% -1:200000 IJ SOLN
INTRAMUSCULAR | Status: DC | PRN
  Administered 2024-03-07: 10:00:00 30 mL via PERINEURAL

## 2024-03-07 MED ORDER — DOXAZOSIN MESYLATE 4 MG PO TABS
8.0000 mg | ORAL_TABLET | Freq: Every day | ORAL | Status: DC
  Administered 2024-03-08: 08:00:00 8 mg via ORAL
  Filled 2024-03-07: qty 2

## 2024-03-07 MED ORDER — FLEET ENEMA RE ENEM: 1.0000 | ENEMA | Freq: Once | RECTAL | Status: DC | PRN

## 2024-03-07 MED ORDER — DEXAMETHASONE SOD PHOSPHATE PF 10 MG/ML IJ SOLN
8.0000 mg | Freq: Once | INTRAMUSCULAR | Status: AC
  Administered 2024-03-07: 11:00:00 8 mg via INTRAVENOUS

## 2024-03-07 MED ORDER — ACETAMINOPHEN 500 MG PO TABS
1000.0000 mg | ORAL_TABLET | Freq: Four times a day (QID) | ORAL | Status: DC
  Administered 2024-03-07 – 2024-03-08 (×3): 1000 mg via ORAL
  Filled 2024-03-07 (×4): qty 2

## 2024-03-07 MED ORDER — ACETAMINOPHEN 325 MG PO TABS: 325.0000 mg | ORAL_TABLET | Freq: Four times a day (QID) | ORAL | Status: DC | PRN

## 2024-03-07 MED ORDER — METOCLOPRAMIDE HCL 5 MG PO TABS: 5.0000 mg | ORAL_TABLET | Freq: Three times a day (TID) | ORAL | Status: DC | PRN

## 2024-03-07 SURGICAL SUPPLY — 42 items
ATTUNE MED DOME PAT 38 KNEE (Knees) IMPLANT
ATTUNE PS FEM RT SZ 7 CEM KNEE (Femur) IMPLANT
ATTUNE PSRP INSR SZ7 10 KNEE (Insert) IMPLANT
BAG COUNTER SPONGE SURGICOUNT (BAG) IMPLANT
BAG ZIPLOCK 12X15 (MISCELLANEOUS) ×1 IMPLANT
BASE TIBIA ATTUNE KNEE SYS SZ6 (Knees) IMPLANT
BLADE SAG 18X100X1.27 (BLADE) ×1 IMPLANT
BLADE SAW SGTL 11.0X1.19X90.0M (BLADE) ×1 IMPLANT
BNDG ELASTIC 6INX 5YD STR LF (GAUZE/BANDAGES/DRESSINGS) ×1 IMPLANT
BOWL SMART MIX CTS (DISPOSABLE) ×1 IMPLANT
CEMENT HV SMART SET (Cement) ×2 IMPLANT
COVER SURGICAL LIGHT HANDLE (MISCELLANEOUS) ×1 IMPLANT
CUFF TRNQT CYL 34X4.125X (TOURNIQUET CUFF) ×1 IMPLANT
DERMABOND ADVANCED .7 DNX12 (GAUZE/BANDAGES/DRESSINGS) ×1 IMPLANT
DRAPE U-SHAPE 47X51 STRL (DRAPES) ×1 IMPLANT
DRESSING AQUACEL AG SP 3.5X10 (GAUZE/BANDAGES/DRESSINGS) IMPLANT
DRSG AQUACEL AG ADV 3.5X10 (GAUZE/BANDAGES/DRESSINGS) ×1 IMPLANT
DURAPREP 26ML APPLICATOR (WOUND CARE) ×1 IMPLANT
ELECT REM PT RETURN 15FT ADLT (MISCELLANEOUS) ×1 IMPLANT
GLOVE BIO SURGEON STRL SZ 6.5 (GLOVE) IMPLANT
GLOVE BIO SURGEON STRL SZ8 (GLOVE) ×1 IMPLANT
GLOVE BIOGEL PI IND STRL 7.0 (GLOVE) ×1 IMPLANT
GLOVE BIOGEL PI IND STRL 8 (GLOVE) ×1 IMPLANT
GOWN STRL REUS W/ TWL LRG LVL3 (GOWN DISPOSABLE) ×1 IMPLANT
HOLDER FOLEY CATH W/STRAP (MISCELLANEOUS) ×1 IMPLANT
IMMOBILIZER KNEE 20 THIGH 36 (SOFTGOODS) ×1 IMPLANT
KIT TURNOVER KIT A (KITS) ×1 IMPLANT
MANIFOLD NEPTUNE II (INSTRUMENTS) ×1 IMPLANT
PACK TOTAL KNEE CUSTOM (KITS) ×1 IMPLANT
PADDING CAST COTTON 6X4 STRL (CAST SUPPLIES) ×2 IMPLANT
PENCIL SMOKE EVACUATOR (MISCELLANEOUS) ×1 IMPLANT
PIN STEINMAN FIXATION KNEE (PIN) IMPLANT
PROTECTOR NERVE ULNAR (MISCELLANEOUS) ×1 IMPLANT
SET HNDPC FAN SPRY TIP SCT (DISPOSABLE) ×1 IMPLANT
SUT MNCRL AB 4-0 PS2 18 (SUTURE) ×1 IMPLANT
SUT STRATAFIX PDS+ 0 24IN (SUTURE) IMPLANT
SUT VIC AB 2-0 CT1 TAPERPNT 27 (SUTURE) ×3 IMPLANT
SUTURE STRATFX 0 PDS 27 VIOLET (SUTURE) ×1 IMPLANT
TOWEL GREEN STERILE FF (TOWEL DISPOSABLE) ×1 IMPLANT
TRAY FOLEY MTR SLVR 16FR STAT (SET/KITS/TRAYS/PACK) ×1 IMPLANT
TUBE SUCTION HIGH CAP CLEAR NV (SUCTIONS) ×1 IMPLANT
WRAP KNEE MAXI GEL POST OP (GAUZE/BANDAGES/DRESSINGS) ×1 IMPLANT

## 2024-03-07 NOTE — Anesthesia Procedure Notes (Signed)
 Procedure Name: MAC Date/Time: 03/07/2024 10:30 AM  Performed by: Dasie Nena PARAS, CRNAPre-anesthesia Checklist: Patient identified, Emergency Drugs available, Suction available, Patient being monitored and Timeout performed Oxygen Delivery Method: Simple face mask Placement Confirmation: positive ETCO2

## 2024-03-07 NOTE — Anesthesia Postprocedure Evaluation (Signed)
 Anesthesia Post Note  Patient: Derrick Lawrence  Procedure(s) Performed: ARTHROPLASTY, KNEE, TOTAL (Right: Knee)     Patient location during evaluation: PACU Anesthesia Type: Regional and Spinal Level of consciousness: awake Pain management: pain level controlled Vital Signs Assessment: post-procedure vital signs reviewed and stable Respiratory status: spontaneous breathing, nonlabored ventilation and respiratory function stable Cardiovascular status: blood pressure returned to baseline and stable Postop Assessment: no apparent nausea or vomiting Anesthetic complications: no   No notable events documented.  Last Vitals:  Vitals:   03/07/24 1350 03/07/24 1542  BP: (!) 156/81 (!) 155/84  Pulse: 62 75  Resp: 16 18  Temp: 36.6 C 36.7 C  SpO2: 97% 98%    Last Pain:  Vitals:   03/07/24 1636  TempSrc:   PainSc: 5                  Janaiah Vetrano P Matika Bartell

## 2024-03-07 NOTE — Transfer of Care (Signed)
 Immediate Anesthesia Transfer of Care Note  Patient: Derrick Lawrence  Procedure(s) Performed: ARTHROPLASTY, KNEE, TOTAL (Right: Knee)  Patient Location: PACU  Anesthesia Type:Spinal and MAC combined with regional for post-op pain  Level of Consciousness: awake, alert , and patient cooperative  Airway & Oxygen Therapy: Patient Spontanous Breathing and Patient connected to face mask oxygen  Post-op Assessment: Report given to RN and Post -op Vital signs reviewed and stable  Post vital signs: Reviewed and stable  Last Vitals:  Vitals Value Taken Time  BP 102/65 03/07/24 12:07  Temp    Pulse 60 03/07/24 12:08  Resp 17 03/07/24 12:08  SpO2 98 % 03/07/24 12:08  Vitals shown include unfiled device data.  Last Pain:  Vitals:   03/07/24 0757  TempSrc:   PainSc: 5          Complications: No notable events documented.

## 2024-03-07 NOTE — Anesthesia Preprocedure Evaluation (Addendum)
 Anesthesia Evaluation  Patient identified by MRN, date of birth, ID band Patient awake    Reviewed: Allergy & Precautions, NPO status , Patient's Chart, lab work & pertinent test results  Airway Mallampati: III  TM Distance: >3 FB Neck ROM: Full    Dental  (+) Missing   Pulmonary    Pulmonary exam normal        Cardiovascular hypertension, Pt. on medications and Pt. on home beta blockers Normal cardiovascular exam     Neuro/Psych  PSYCHIATRIC DISORDERS  Depression    CVA, Residual Symptoms    GI/Hepatic Neg liver ROS,GERD  Medicated and Controlled,,  Endo/Other  negative endocrine ROS    Renal/GU negative Renal ROS     Musculoskeletal  (+) Arthritis ,    Abdominal  (+) + obese  Peds  Hematology negative hematology ROS (+)   Anesthesia Other Findings Right knee osteoarthritis  Reproductive/Obstetrics                              Anesthesia Physical Anesthesia Plan  ASA: 3  Anesthesia Plan: Regional and Spinal   Post-op Pain Management: Regional block*   Induction:   PONV Risk Score and Plan: 1 and Ondansetron , Dexamethasone , Propofol  infusion and Treatment may vary due to age or medical condition  Airway Management Planned: Simple Face Mask  Additional Equipment:   Intra-op Plan:   Post-operative Plan:   Informed Consent: I have reviewed the patients History and Physical, chart, labs and discussed the procedure including the risks, benefits and alternatives for the proposed anesthesia with the patient or authorized representative who has indicated his/her understanding and acceptance.     Dental advisory given  Plan Discussed with: CRNA  Anesthesia Plan Comments:         Anesthesia Quick Evaluation

## 2024-03-07 NOTE — Op Note (Signed)
 OPERATIVE REPORT-TOTAL KNEE ARTHROPLASTY   Pre-operative diagnosis- Osteoarthritis  Right knee(s)  Post-operative diagnosis- Osteoarthritis Right knee(s)  Procedure-  Right  Total Knee Arthroplasty  Surgeon- Derrick GAILS. Verginia Toohey, MD  Assistant- Corean Sender, PA-C   Anesthesia-  Adductor canal block and spinal  EBL-50 mL   Drains None  Tourniquet time-  Total Tourniquet Time Documented: Thigh (Right) - 36 minutes Total: Thigh (Right) - 36 minutes     Complications- None  Condition-PACU - hemodynamically stable.   Brief Clinical Note  Derrick Lawrence is a 84 y.o. year old male with end stage OA of his right knee with progressively worsening pain and dysfunction. He has constant pain, with activity and at rest and significant functional deficits with difficulties even with ADLs. He has had extensive non-op management including analgesics, injections of cortisone and viscosupplements, and home exercise program, but remains in significant pain with significant dysfunction. Radiographs show bone on bone arthritis medial and patellofemoral. He presents now for right Total Knee Arthroplasty.     Procedure in detail---   The patient is brought into the operating room and positioned supine on the operating table. After successful administration of  Adductor canal block and spinal,   a tourniquet is placed high on the  Right thigh(s) and the lower extremity is prepped and draped in the usual sterile fashion. Time out is performed by the operating team and then the  Right lower extremity is wrapped in Esmarch, knee flexed and the tourniquet inflated to 300 mmHg.       A midline incision is made with a ten blade through the subcutaneous tissue to the level of the extensor mechanism. A fresh blade is used to make a medial parapatellar arthrotomy. Soft tissue over the proximal medial tibia is subperiosteally elevated to the joint line with a knife and into the semimembranosus bursa with a  Cobb elevator. Soft tissue over the proximal lateral tibia is elevated with attention being paid to avoiding the patellar tendon on the tibial tubercle. The patella is everted, knee flexed 90 degrees and the ACL and PCL are removed. Findings are bone on bone medial and patellofemoral with large global osteophytes        The drill is used to create a starting hole in the distal femur and the canal is thoroughly irrigated with sterile saline to remove the fatty contents. The 5 degree Right  valgus alignment guide is placed into the femoral canal and the distal femoral cutting block is pinned to remove 10 mm off the distal femur. Resection is made with an oscillating saw.      The tibia is subluxed forward and the menisci are removed. The extramedullary alignment guide is placed referencing proximally at the medial aspect of the tibial tubercle and distally along the second metatarsal axis and tibial crest. The block is pinned to remove 2mm off the more deficient medial  side. Resection is made with an oscillating saw. Size 6 is the most appropriate size for the tibia and the proximal tibia is prepared with the modular drill and keel punch for that size.      The femoral sizing guide is placed and size 7 is most appropriate. Rotation is marked off the epicondylar axis and confirmed by creating a rectangular flexion gap at 90 degrees. The size 7 cutting block is pinned in this rotation and the anterior, posterior and chamfer cuts are made with the oscillating saw. The intercondylar block is then placed and that cut is  made.      Trial size 6 tibial component, trial size 7 posterior stabilized femur and a 10  mm posterior stabilized rotating platform insert trial is placed. Full extension is achieved with excellent varus/valgus and anterior/posterior balance throughout full range of motion. The patella is everted and thickness measured to be 24  mm. Free hand resection is taken to 14 mm, a 38 template is placed, lug  holes are drilled, trial patella is placed, and it tracks normally. Osteophytes are removed off the posterior femur with the trial in place. All trials are removed and the cut bone surfaces prepared with pulsatile lavage. Cement is mixed and once ready for implantation, the size 6 tibial implant, size  7 posterior stabilized femoral component, and the size 38 patella are cemented in place and the patella is held with the clamp. The trial insert is placed and the knee held in full extension. The Exparel  (20 ml mixed with 60 ml saline) is injected into the extensor mechanism, posterior capsule, medial and lateral gutters and subcutaneous tissues.  All extruded cement is removed and once the cement is hard the permanent 10 mm posterior stabilized rotating platform insert is placed into the tibial tray.      The wound is copiously irrigated with saline solution and the extensor mechanism closed with # 0 Stratofix suture. The tourniquet is released for a total tourniquet time of 36  minutes. Flexion against gravity is 140 degrees and the patella tracks normally. Subcutaneous tissue is closed with 2.0 vicryl and subcuticular with running 4.0 Monocryl. The incision is cleaned and dried and steri-strips and a bulky sterile dressing are applied. The limb is placed into a knee immobilizer and the patient is awakened and transported to recovery in stable condition.      Please note that a surgical assistant was a medical necessity for this procedure in order to perform it in a safe and expeditious manner. Surgical assistant was necessary to retract the ligaments and vital neurovascular structures to prevent injury to them and also necessary for proper positioning of the limb to allow for anatomic placement of the prosthesis.   Derrick ROCKFORD Jaslyne Beeck, MD    03/07/2024, 11:44 AM

## 2024-03-07 NOTE — Progress Notes (Signed)
 Orthopedic Tech Progress Note Patient Details:  Derrick Lawrence [facility] 04-Nov-1940 [id number]  CPM Right Knee CPM Right Knee: On Right Knee Flexion (Degrees): 40 Right Knee Extension (Degrees): 10     [name] A [name] 03/07/2024, 12:28 PM

## 2024-03-07 NOTE — Anesthesia Procedure Notes (Addendum)
 Spinal  Patient location during procedure: OR Start time: 03/07/2024 10:32 AM End time: 03/07/2024 10:42 AM Reason for block: surgical anesthesia  Staffing Performed: anesthesiologist  Authorized by: Patrisha Bernardino SQUIBB, MD   Performed by: Patrisha Bernardino SQUIBB, MD  Preanesthetic Checklist Completed: patient identified, IV checked, risks and benefits discussed, surgical consent, monitors and equipment checked, pre-op evaluation and timeout performed Spinal Block Patient position: sitting Prep: DuraPrep Patient monitoring: cardiac monitor, continuous pulse ox and blood pressure Approach: midline Location: L3-4 Injection technique: single-shot Needle Needle type: Quincke  Needle gauge: 22 G Needle length: 9 cm Assessment Sensory level: T10 Events: CSF return and second provider  Additional Notes Functioning IV was confirmed and monitors were applied. Sterile prep and drape, including hand hygiene and sterile gloves were used. The patient was positioned and the spine was prepped. The skin was anesthetized with lidocaine .  Free flow of clear CSF was obtained after multiple attempts by both CRNA and MD prior to injecting local anesthetic into the CSF.  The spinal needle aspirated freely following injection.  The needle was carefully withdrawn.  The patient tolerated the procedure well.

## 2024-03-07 NOTE — Anesthesia Procedure Notes (Addendum)
 Anesthesia Regional Block: Adductor canal block   Pre-Anesthetic Checklist: , timeout performed,  Correct Patient, Correct Site, Correct Laterality,  Correct Procedure,, site marked,  Risks and benefits discussed,  Surgical consent,  Pre-op evaluation,  At surgeon's request and post-op pain management  Laterality: Right  Prep: chloraprep       Needles:  Injection technique: Single-shot  Needle Type: Echogenic Stimulator Needle     Needle Length: 10cm  Needle Gauge: 20     Additional Needles:   Procedures:,,,, ultrasound used (permanent image in chart),,    Narrative:  Start time: 03/07/2024 9:50 AM End time: 03/07/2024 10:00 AM Injection made incrementally with aspirations every 5 mL.  Performed by: Personally  Anesthesiologist: Patrisha Bernardino SQUIBB, MD  Additional Notes: Functioning IV was confirmed and monitors were applied. A time-out was performed. Hand hygiene and sterile gloves were used. The thigh was placed in a frog-leg position and prepped in a sterile fashion. A 20ga Bbraun echogenic stimulator needle was placed using ultrasound guidance.  Negative aspiration and negative test dose prior to incremental administration of local anesthetic. The patient tolerated the procedure well.

## 2024-03-07 NOTE — Progress Notes (Signed)
 Orthopedic Tech Progress Note Patient Details:  Derrick Lawrence [facility] 1941-03-02 [id number]  CPM Right Knee CPM Right Knee: Off Right Knee Flexion (Degrees): 40 Right Knee Extension (Degrees): 10  Post Interventions Patient Tolerated: Well  Derrick Lawrence 03/07/2024, 4:35 PM

## 2024-03-07 NOTE — Interval H&P Note (Signed)
 History and Physical Interval Note:  03/07/2024 8:27 AM  Derrick Lawrence  has presented today for surgery, with the diagnosis of Right knee osteoarthritis.  The various methods of treatment have been discussed with the patient and family. After consideration of risks, benefits and other options for treatment, the patient has consented to  Procedures: ARTHROPLASTY, KNEE, TOTAL (Right) as a surgical intervention.  The patient's history has been reviewed, patient examined, no change in status, stable for surgery.  I have reviewed the patient's chart and labs.  Questions were answered to the patient's satisfaction.     [name]

## 2024-03-07 NOTE — Evaluation (Signed)
 Physical Therapy Evaluation Patient Details Name: Derrick Lawrence MRN: [id number] DOB: January 30, 1941 Today's Date: 03/07/2024  History of Present Illness  83 yo male presents to therapy s/p R TKA on 03/07/2024 due to failure of conservative measures. Pt PMH includes but is not limited to: orthostatic hypotension, NSVT, CVA (L side late effects) HTN, HLD, diverticula of colon, skin ca, cholecystectomy.  Clinical Impression      Derrick Lawrence is a 83 y.o. male POD 0 s/p R TKA. Patient reports mod I with mobility at baseline. Patient is now limited by functional impairments (see PT problem list below) and requires mod A for bed mobility and CGA for transfers. Patient was able to ambulate 45 feet with RW and CGA level of assist. Patient instructed in exercise to facilitate ROM and circulation to manage edema.  Patient will benefit from continued skilled PT interventions to address impairments and progress towards PLOF. Acute PT will follow to progress mobility and stair training in preparation for safe discharge home with family support and [facility] services.     If plan is discharge home, recommend the following: A little help with walking and/or transfers;A little help with bathing/dressing/bathroom;Assistance with cooking/housework;Assist for transportation;Help with stairs or ramp for entrance   Can travel by private vehicle        Equipment Recommendations None recommended by PT  Recommendations for Other Services       Functional Status Assessment Patient has had a recent decline in their functional status and demonstrates the ability to make significant improvements in function in a reasonable and predictable amount of time.     Precautions / Restrictions Precautions Precautions: Fall;Knee Restrictions Weight Bearing Restrictions Per Provider Order: Yes RLE Weight Bearing Per Provider Order: Weight bearing as tolerated      Mobility  Bed Mobility Overal bed mobility: Needs  Assistance Bed Mobility: Supine to Sit     Supine to sit: Mod assist, HOB elevated     General bed mobility comments: increased time, mod A with cues    Transfers Overall transfer level: Needs assistance Equipment used: Rolling walker (2 wheels) Transfers: Sit to/from Stand Sit to Stand: Contact guard assist           General transfer comment: min cues    Ambulation/Gait Ambulation/Gait assistance: Contact guard assist Gait Distance (Feet): 45 Feet Assistive device: Rolling walker (2 wheels) Gait Pattern/deviations: Step-to pattern, Decreased step length - right, Antalgic, Trunk flexed, Decreased stance time - right Gait velocity: decreased     General Gait Details: min cues for posture, RW management  Stairs            Wheelchair Mobility     Tilt Bed    Modified Rankin (Stroke Patients Only)       Balance Overall balance assessment: Needs assistance Sitting-balance support: Feet supported Sitting balance-Leahy Scale: Good     Standing balance support: Bilateral upper extremity supported, During functional activity, Reliant on assistive device for balance Standing balance-Leahy Scale: Poor                               Pertinent Vitals/Pain Pain Assessment Pain Assessment: 0-10 Pain Score: 5  Pain Location: R knee and LE Pain Descriptors / Indicators: Aching, Constant, Discomfort, Dull, Grimacing, Operative site guarding Pain Intervention(s): Limited activity within patient's tolerance, Monitored during session, Premedicated before session, Repositioned, Ice applied    Home Living Family/patient expects to be discharged to:: Private  residence Living Arrangements: Alone Available Help at Discharge: Family Type of Home: House Home Access: Ramped entrance       Home Layout: One level Home Equipment: Agricultural Consultant (2 wheels);Cane - single point;BSC/3in1;Shower seat (shower seat ordered and scheduled to arrive 12/16)       Prior Function Prior Level of Function : Independent/Modified Independent;Driving             Mobility Comments: Mod I with use of SPC for all ADLs, self care tasks and IADLs       Extremity/Trunk Assessment        Lower Extremity Assessment Lower Extremity Assessment: RLE deficits/detail RLE Deficits / Details: ankle DF/PF 5/5; SLR < 10 degree lag RLE Sensation: WNL    Cervical / Trunk Assessment Cervical / Trunk Assessment: Normal  Communication   Communication Communication: No apparent difficulties    Cognition Arousal: Alert Behavior During Therapy: WFL for tasks assessed/performed   PT - Cognitive impairments: No apparent impairments                         Following commands: Intact       Cueing       General Comments      Exercises Total Joint Exercises Ankle Circles/Pumps: AROM, Both, 10 reps   Assessment/Plan    PT Assessment Patient needs continued PT services  PT Problem List Decreased strength;Decreased range of motion;Decreased activity tolerance;Decreased balance;Decreased coordination;Decreased mobility;Pain       PT Treatment Interventions DME instruction;Gait training;Functional mobility training;Therapeutic activities;Therapeutic exercise;Balance training;Neuromuscular re-education;Patient/family education;Modalities    PT Goals (Current goals can be found in the Care Plan section)  Acute Rehab PT Goals Patient Stated Goal: to get back to being mod I and driving no pain PT Goal Formulation: With patient Time For Goal Achievement: 03/21/24 Potential to Achieve Goals: Good    Frequency 7X/week     Co-evaluation               AM-PAC PT 6 Clicks Mobility  Outcome Measure Help needed turning from your back to your side while in a flat bed without using bedrails?: None Help needed moving from lying on your back to sitting on the side of a flat bed without using bedrails?: A Little Help needed moving to and from  a bed to a chair (including a wheelchair)?: A Little Help needed standing up from a chair using your arms (e.g., wheelchair or bedside chair)?: A Little Help needed to walk in hospital room?: A Little Help needed climbing 3-5 steps with a railing? : Total 6 Click Score: 17    End of Session Equipment Utilized During Treatment: Gait belt Activity Tolerance: Patient tolerated treatment well;No increased pain Patient left: in chair;with call bell/phone within reach Nurse Communication: Mobility status PT Visit Diagnosis: Unsteadiness on feet (R26.81);Other abnormalities of gait and mobility (R26.89);Muscle weakness (generalized) (M62.81);Difficulty in walking, not elsewhere classified (R26.2);Pain Pain - Right/Left: Right Pain - part of body: Knee;Leg    Time: 8357-8291 PT Time Calculation (min) (ACUTE ONLY): 26 min   Charges:   PT Evaluation $PT Eval Low Complexity: 1 Low PT Treatments $Gait Training: 8-22 mins PT General Charges $$ ACUTE PT VISIT: 1 Visit         [place], PT Acute Rehab   [place] VEAR Drone 03/07/2024, 6:37 PM

## 2024-03-07 NOTE — Discharge Instructions (Addendum)
 Frank Aluisio, MD Total Joint Specialist [facility] [place] [address] [place], [place] [address] [phone]  TOTAL KNEE REPLACEMENT POSTOPERATIVE DIRECTIONS    Knee Rehabilitation, Guidelines Following Surgery  Results after knee surgery are often greatly improved when you follow the exercise, range of motion and muscle strengthening exercises prescribed by your doctor. Safety measures are also important to protect the knee from further injury. If any of these exercises cause you to have increased pain or swelling in your knee joint, decrease the amount until you are comfortable again and slowly increase them. If you have problems or questions, call your caregiver or physical therapist for advice.   BLOOD CLOT PREVENTION Take a 10 mg Xarelto once a day for three weeks following surgery. Then take an 81 mg Aspirin once a day for three weeks. Then discontinue Aspirin. You may resume your vitamins/supplements once you have discontinued the Xarelto. Do not take any NSAIDs (Advil, Aleve, Ibuprofen, Meloxicam, etc.) until you have discontinued the Xarelto.   HOME CARE INSTRUCTIONS  Remove items at home which could result in a fall. This includes throw rugs or furniture in walking pathways.  ICE to the affected knee as much as tolerated. Icing helps control swelling. If the swelling is well controlled you will be more comfortable and rehab easier. Continue to use ice on the knee for pain and swelling from surgery. You may notice swelling that will progress down to the foot and ankle. This is normal after surgery. Elevate the leg when you are not up walking on it.    Continue to use the breathing machine which will help keep your temperature down. It is common for your temperature to cycle up and down following surgery, especially at night when you are not up moving around and exerting yourself. The breathing machine keeps your lungs expanded and your temperature  down. Do not place pillow under the operative knee, focus on keeping the knee straight while resting  DIET You may resume your previous home diet once you are discharged from the hospital.  DRESSING / WOUND CARE / SHOWERING Keep your bulky bandage on for 2 days. On the third post-operative day you may remove the Ace bandage and gauze. There is a waterproof adhesive bandage on your skin which will stay in place until your first follow-up appointment. Once you remove this you will not need to place another bandage You may begin showering 3 days following surgery, but do not submerge the incision under water.  ACTIVITY For the first 5 days, the key is rest and control of pain and swelling Do your home exercises twice a day starting on post-operative day 3. On the days you go to physical therapy, just do the home exercises once that day. You should rest, ice and elevate the leg for 50 minutes out of every hour. Get up and walk/stretch for 10 minutes per hour. After 5 days you can increase your activity slowly as tolerated. Walk with your walker as instructed. Use the walker until you are comfortable transitioning to a cane. Walk with the cane in the opposite hand of the operative leg. You may discontinue the cane once you are comfortable and walking steadily. Avoid periods of inactivity such as sitting longer than an hour when not asleep. This helps prevent blood clots.  You may discontinue the knee immobilizer once you are able to perform a straight leg raise while lying down. You may resume a sexual relationship in one month or   when given the OK by your doctor.  You may return to work once you are cleared by your doctor.  Do not drive a car for 6 weeks or until released by your surgeon.  Do not drive while taking narcotics.  TED HOSE STOCKINGS Wear the elastic stockings on both legs for three weeks following surgery during the day. You may remove them at night for sleeping.  WEIGHT  BEARING Weight bearing as tolerated with assist device (walker, cane, etc) as directed, use it as long as suggested by your surgeon or therapist, typically at least 4-6 weeks.  POSTOPERATIVE CONSTIPATION PROTOCOL Constipation - defined medically as fewer than three stools per week and severe constipation as less than one stool per week.  One of the most common issues patients have following surgery is constipation.  Even if you have a regular bowel pattern at home, your normal regimen is likely to be disrupted due to multiple reasons following surgery.  Combination of anesthesia, postoperative narcotics, change in appetite and fluid intake all can affect your bowels.  In order to avoid complications following surgery, here are some recommendations in order to help you during your recovery period.  Colace (docusate) - Pick up an over-the-counter form of Colace or another stool softener and take twice a day as long as you are requiring postoperative pain medications.  Take with a full glass of water daily.  If you experience loose stools or diarrhea, hold the colace until you stool forms back up. If your symptoms do not get better within 1 week or if they get worse, check with your doctor. Dulcolax (bisacodyl) - Pick up over-the-counter and take as directed by the product packaging as needed to assist with the movement of your bowels.  Take with a full glass of water.  Use this product as needed if not relieved by Colace only.  MiraLax (polyethylene glycol) - Pick up over-the-counter to have on hand. MiraLax is a solution that will increase the amount of water in your bowels to assist with bowel movements.  Take as directed and can mix with a glass of water, juice, soda, coffee, or tea. Take if you go more than two days without a movement. Do not use MiraLax more than once per day. Call your doctor if you are still constipated or irregular after using this medication for 7 days in a row.  If you continue  to have problems with postoperative constipation, please contact the office for further assistance and recommendations.  If you experience "the worst abdominal pain ever" or develop nausea or vomiting, please contact the office immediatly for further recommendations for treatment.  ITCHING If you experience itching with your medications, try taking only a single pain pill, or even half a pain pill at a time.  You can also use Benadryl over the counter for itching or also to help with sleep.   MEDICATIONS See your medication summary on the "After Visit Summary" that the nursing staff will review with you prior to discharge.  You may have some home medications which will be placed on hold until you complete the course of blood thinner medication.  It is important for you to complete the blood thinner medication as prescribed by your surgeon.  Continue your approved medications as instructed at time of discharge.  PRECAUTIONS If you experience chest pain or shortness of breath - call 911 immediately for transfer to the hospital emergency department.  If you develop a fever greater that 101 F, purulent   drainage from wound, increased redness or drainage from wound, foul odor from the wound/dressing, or calf pain - CONTACT YOUR SURGEON.                                                   FOLLOW-UP APPOINTMENTS Make sure you keep all of your appointments after your operation with your surgeon and caregivers. You should call the office at the above phone number and make an appointment for approximately two weeks after the date of your surgery or on the date instructed by your surgeon outlined in the "After Visit Summary".  RANGE OF MOTION AND STRENGTHENING EXERCISES  Rehabilitation of the knee is important following a knee injury or an operation. After just a few days of immobilization, the muscles of the thigh which control the knee become weakened and shrink (atrophy). Knee exercises are designed to build up  the tone and strength of the thigh muscles and to improve knee motion. Often times heat used for twenty to thirty minutes before working out will loosen up your tissues and help with improving the range of motion but do not use heat for the first two weeks following surgery. These exercises can be done on a training (exercise) mat, on the floor, on a table or on a bed. Use what ever works the best and is most comfortable for you Knee exercises include:  Leg Lifts - While your knee is still immobilized in a splint or cast, you can do straight leg raises. Lift the leg to 60 degrees, hold for 3 sec, and slowly lower the leg. Repeat 10-20 times 2-3 times daily. Perform this exercise against resistance later as your knee gets better.  Quad and Hamstring Sets - Tighten up the muscle on the front of the thigh (Quad) and hold for 5-10 sec. Repeat this 10-20 times hourly. Hamstring sets are done by pushing the foot backward against an object and holding for 5-10 sec. Repeat as with quad sets.  Leg Slides: Lying on your back, slowly slide your foot toward your buttocks, bending your knee up off the floor (only go as far as is comfortable). Then slowly slide your foot back down until your leg is flat on the floor again. Angel Wings: Lying on your back spread your legs to the side as far apart as you can without causing discomfort.  A rehabilitation program following serious knee injuries can speed recovery and prevent re-injury in the future due to weakened muscles. Contact your doctor or a physical therapist for more information on knee rehabilitation.   POST-OPERATIVE OPIOID TAPER INSTRUCTIONS: It is important to wean off of your opioid medication as soon as possible. If you do not need pain medication after your surgery it is ok to stop day one. Opioids include: Codeine, Hydrocodone(Norco, Vicodin), Oxycodone(Percocet, oxycontin) and hydromorphone amongst others.  Long term and even short term use of opiods can  cause: Increased pain response Dependence Constipation Depression Respiratory depression And more.  Withdrawal symptoms can include Flu like symptoms Nausea, vomiting And more Techniques to manage these symptoms Hydrate well Eat regular healthy meals Stay active Use relaxation techniques(deep breathing, meditating, yoga) Do Not substitute Alcohol to help with tapering If you have been on opioids for less than two weeks and do not have pain than it is ok to stop all together.  Plan to   wean off of opioids This plan should start within one week post op of your joint replacement. Maintain the same interval or time between taking each dose and first decrease the dose.  Cut the total daily intake of opioids by one tablet each day Next start to increase the time between doses. The last dose that should be eliminated is the evening dose.   IF YOU ARE TRANSFERRED TO A SKILLED REHAB FACILITY If the patient is transferred to a skilled rehab facility following release from the hospital, a list of the current medications will be sent to the facility for the patient to continue.  When discharged from the skilled rehab facility, please have the facility set up the patient's Home Health Physical Therapy prior to being released. Also, the skilled facility will be responsible for providing the patient with their medications at time of release from the facility to include their pain medication, the muscle relaxants, and their blood thinner medication. If the patient is still at the rehab facility at time of the two week follow up appointment, the skilled rehab facility will also need to assist the patient in arranging follow up appointment in our office and any transportation needs.  MAKE SURE YOU:  Understand these instructions.  Get help right away if you are not doing well or get worse.   DENTAL ANTIBIOTICS:  In most cases prophylactic antibiotics for Dental procdeures after total joint surgery are  not necessary.  Exceptions are as follows:  1. History of prior total joint infection  2. Severely immunocompromised (Organ Transplant, cancer chemotherapy, Rheumatoid biologic meds such as Humera)  3. Poorly controlled diabetes (A1C &gt; 8.0, blood glucose over 200)  If you have one of these conditions, contact your surgeon for an antibiotic prescription, prior to your dental procedure.    Pick up stool softner and laxative for home use following surgery while on pain medications. Do not submerge incision under water. Please use good hand washing techniques while changing dressing each day. May shower starting three days after surgery. Please use a clean towel to pat the incision dry following showers. Continue to use ice for pain and swelling after surgery. Do not use any lotions or creams on the incision until instructed by your surgeon.  Information on my medicine - XARELTO (Rivaroxaban)  Why was Xarelto prescribed for you? Xarelto was prescribed for you to reduce the risk of blood clots forming after orthopedic surgery. The medical term for these abnormal blood clots is venous thromboembolism (VTE).  What do you need to know about xarelto ? Take your Xarelto ONCE DAILY at the same time every day. You may take it either with or without food.  If you have difficulty swallowing the tablet whole, you may crush it and mix in applesauce just prior to taking your dose.  Take Xarelto exactly as prescribed by your doctor and DO NOT stop taking Xarelto without talking to the doctor who prescribed the medication.  Stopping without other VTE prevention medication to take the place of Xarelto may increase your risk of developing a clot.  After discharge, you should have regular check-up appointments with your healthcare provider that is prescribing your Xarelto.    What do you do if you miss a dose? If you miss a dose, take it as soon as you remember on the same day then  continue your regularly scheduled once daily regimen the next day. Do not take two doses of Xarelto on the same day.   Important   Safety Information A possible side effect of Xarelto is bleeding. You should call your healthcare provider right away if you experience any of the following: Bleeding from an injury or your nose that does not stop. Unusual colored urine (red or dark brown) or unusual colored stools (red or black). Unusual bruising for unknown reasons. A serious fall or if you hit your head (even if there is no bleeding).  Some medicines may interact with Xarelto and might increase your risk of bleeding while on Xarelto. To help avoid this, consult your healthcare provider or pharmacist prior to using any new prescription or non-prescription medications, including herbals, vitamins, non-steroidal anti-inflammatory drugs (NSAIDs) and supplements.  This website has more information on Xarelto: www.xarelto.com.    

## 2024-03-08 ENCOUNTER — Encounter (HOSPITAL_COMMUNITY): Payer: Self-pay | Admitting: Orthopedic Surgery

## 2024-03-08 ENCOUNTER — Other Ambulatory Visit (HOSPITAL_COMMUNITY): Payer: Self-pay

## 2024-03-08 DIAGNOSIS — M1711 Unilateral primary osteoarthritis, right knee: Secondary | ICD-10-CM | POA: Diagnosis not present

## 2024-03-08 LAB — BASIC METABOLIC PANEL WITH GFR
Anion gap: 9 (ref 5–15)
BUN: 17 mg/dL (ref 8–23)
CO2: 26 mmol/L (ref 22–32)
Calcium: 8.1 mg/dL — ABNORMAL LOW (ref 8.9–10.3)
Chloride: 102 mmol/L (ref 98–111)
Creatinine, Ser: 1.03 mg/dL (ref 0.61–1.24)
GFR, Estimated: >60 mL/min (ref >60)
Glucose, Bld: 128 mg/dL — ABNORMAL HIGH (ref 70–99)
Potassium: 3.9 mmol/L (ref 3.5–5.1)
Sodium: 136 mmol/L (ref 135–145)

## 2024-03-08 LAB — CBC
HCT: 38.3 % — ABNORMAL LOW (ref 39.0–52.0)
Hemoglobin: 13.4 g/dL (ref 13.0–17.0)
MCH: 30.4 pg (ref 26.0–34.0)
MCHC: 35 g/dL (ref 30.0–36.0)
MCV: 86.8 fL (ref 80.0–100.0)
Platelets: 184 K/uL (ref 150–400)
RBC: 4.41 MIL/uL (ref 4.22–5.81)
RDW: 13.2 % (ref 11.5–15.5)
WBC: 13.5 K/uL — ABNORMAL HIGH (ref 4.0–10.5)
nRBC: 0 % (ref 0.0–0.2)

## 2024-03-08 MED ORDER — ONDANSETRON HCL 4 MG PO TABS
4.0000 mg | ORAL_TABLET | Freq: Four times a day (QID) | ORAL | 0 refills | Status: AC | PRN
  Filled 2024-03-08: qty 20, 20d supply, fill #0

## 2024-03-08 MED ORDER — RIVAROXABAN 10 MG PO TABS
10.0000 mg | ORAL_TABLET | Freq: Every day | ORAL | 0 refills | Status: AC
  Filled 2024-03-08: qty 21, 21d supply, fill #0

## 2024-03-08 MED ORDER — METHOCARBAMOL 500 MG PO TABS
500.0000 mg | ORAL_TABLET | Freq: Four times a day (QID) | ORAL | 0 refills | Status: AC | PRN
  Filled 2024-03-08: qty 40, 10d supply, fill #0

## 2024-03-08 MED ORDER — OXYCODONE HCL 5 MG PO TABS
5.0000 mg | ORAL_TABLET | ORAL | 0 refills | Status: AC | PRN
  Filled 2024-03-08: qty 42, 7d supply, fill #0

## 2024-03-08 NOTE — Progress Notes (Signed)
 Physical Therapy Treatment Patient Details Name: Derrick Lawrence MRN: [id number] DOB: May 01, 1940 Today's Date: 03/08/2024   History of Present Illness 83 yo male presents to therapy s/p R TKA on 03/07/2024 due to failure of conservative measures. Pt PMH includes but is not limited to: orthostatic hypotension, NSVT, CVA (L side late effects) HTN, HLD, diverticula of colon, skin ca, cholecystectomy.    PT Comments  POD # 1 am session Pt AxO x 3 pleasant and eager to go home today.  Pt lives home alone but will have Family support.  Pt has a RAMP.  Assisted OOB was difficult due to Pt's ABD girth.  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  Assisted with amb a functional distance in hallway.  Mod VC's for proper walker to self distance and to decrease his step length to ensure knee stability due to Pt's BMI.  Then returned to room to perform some TE's following HEP handout.  Instructed on proper tech, freq as well as use of ICE.    Will see Pt later today with family, then plan to D/C.     If plan is discharge home, recommend the following: A little help with walking and/or transfers;A little help with bathing/dressing/bathroom;Assistance with cooking/housework;Assist for transportation;Help with stairs or ramp for entrance   Can travel by private vehicle        Equipment Recommendations  None recommended by PT    Recommendations for Other Services       Precautions / Restrictions Precautions Precaution/Restrictions Comments: no pillow under knee Restrictions Weight Bearing Restrictions Per Provider Order: No RLE Weight Bearing Per Provider Order: Weight bearing as tolerated     Mobility  Bed Mobility Overal bed mobility: Needs Assistance Bed Mobility: Supine to Sit     Supine to sit: Mod assist     General bed mobility comments: increased time, mod A with cues.  Pt plans to sleep in his recliner chair.    Transfers Overall transfer level: Needs assistance Equipment  used: Rolling walker (2 wheels) Transfers: Sit to/from Stand Sit to Stand: Supervision, Contact guard assist           General transfer comment: min cues on safety with turns, turn completion prior to sit.    Ambulation/Gait Ambulation/Gait assistance: Supervision, Contact guard assist Gait Distance (Feet): 47 Feet Assistive device: Rolling walker (2 wheels) Gait Pattern/deviations: Step-to pattern, Decreased step length - right, Antalgic, Trunk flexed, Decreased stance time - right Gait velocity: decreased     General Gait Details: Mod VC's on proper walker to self distance and to decrease step length to ensure knee stability.   Stairs             Wheelchair Mobility     Tilt Bed    Modified Rankin (Stroke Patients Only)       Balance                                            Communication    Cognition Arousal: Alert     PT - Cognitive impairments: No apparent impairments                       PT - Cognition Comments: AxO x 3 pleasant and eager to go home today.  Lives home alone (spouse decreased) but will have Daghter and SIL assist. Following commands:  Intact      Cueing Cueing Techniques: Verbal cues  Exercises  Total Knee Replacement TE's following HEP handout 10 reps B LE ankle pumps 05 reps towel squeezes 05 reps knee presses 05 reps heel slides  05 reps SAQ's 05 reps SLR's 05 reps ABD Educated on use of gait belt to assist with TE's Followed by ICE     General Comments        Pertinent Vitals/Pain Pain Assessment Pain Assessment: 0-10 Pain Score: 4  Pain Location: R knee Pain Descriptors / Indicators: Aching, Constant, Grimacing, Operative site guarding Pain Intervention(s): Monitored during session, Premedicated before session, Repositioned, Ice applied    Home Living                          Prior Function            PT Goals (current goals can now be found in the care plan  section) Progress towards PT goals: Progressing toward goals    Frequency    7X/week      PT Plan      Co-evaluation              AM-PAC PT 6 Clicks Mobility   Outcome Measure  Help needed turning from your back to your side while in a flat bed without using bedrails?: A Little Help needed moving from lying on your back to sitting on the side of a flat bed without using bedrails?: A Little Help needed moving to and from a bed to a chair (including a wheelchair)?: A Little Help needed standing up from a chair using your arms (e.g., wheelchair or bedside chair)?: A Little Help needed to walk in hospital room?: A Little Help needed climbing 3-5 steps with a railing? : A Lot 6 Click Score: 17    End of Session Equipment Utilized During Treatment: Gait belt Activity Tolerance: Patient tolerated treatment well Patient left: in chair;with call bell/phone within reach;with chair alarm set Nurse Communication: Mobility status PT Visit Diagnosis: Unsteadiness on feet (R26.81);Other abnormalities of gait and mobility (R26.89);Muscle weakness (generalized) (M62.81);Difficulty in walking, not elsewhere classified (R26.2);Pain Pain - Right/Left: Right Pain - part of body: Knee;Leg     Time: 8961-8893 PT Time Calculation (min) (ACUTE ONLY): 28 min  Charges:    $Gait Training: 8-22 mins $Therapeutic Exercise: 8-22 mins PT General Charges $$ ACUTE PT VISIT: 1 Visit                     Katheryn Leap  PTA Acute  Rehabilitation Services Office M-F          [phone]

## 2024-03-08 NOTE — Progress Notes (Signed)
 Physical Therapy Treatment Patient Details Name: Derrick Lawrence MRN: [id number] DOB: 1940/10/28 Today's Date: 03/08/2024   History of Present Illness 83 yo male presents to therapy s/p R TKA on 03/07/2024 due to failure of conservative measures. Pt PMH includes but is not limited to: orthostatic hypotension, NSVT, CVA (L side late effects) HTN, HLD, diverticula of colon, skin ca, cholecystectomy.    PT Comments  POD # 1 pm session with Son In Law Pt was OOB in recliner.  Instructed SIL use of safety belt and assist level for transfers and amb.  General Gait Details: decreased amb distance due to increased pain.  Recliner pulled to Pt I have to sit Pt has to be able to amb from the car, up a long RAMP and into house.  Family plans to use a wheelchair that was used for Pt's spouse (in the shed) to safely get Pt into the house. Reviewed HEP.  Pt is scheduled to receive HH PT then transition to OP.  Instructed on use of ICE, proper R LE positioning (extension) and proper expected activity level.   Pt ready to D/C home with 24/7 Family support.     If plan is discharge home, recommend the following: A little help with walking and/or transfers;A little help with bathing/dressing/bathroom;Assistance with cooking/housework;Assist for transportation;Help with stairs or ramp for entrance   Can travel by private vehicle        Equipment Recommendations  None recommended by PT    Recommendations for Other Services       Precautions / Restrictions Precautions Precaution/Restrictions Comments: no pillow under knee Restrictions Weight Bearing Restrictions Per Provider Order: No RLE Weight Bearing Per Provider Order: Weight bearing as tolerated     Mobility  Bed Mobility   General bed mobility comments: Pt was OOB in recliner    Transfers Overall transfer level: Needs assistance Equipment used: Rolling walker (2 wheels) Transfers: Sit to/from Stand Sit to Stand: Supervision            General transfer comment: min cues on safety with turns, turn completion prior to sit.    Ambulation/Gait Ambulation/Gait assistance: Supervision, Contact guard assist Gait Distance (Feet): 12 Feet Assistive device: Rolling walker (2 wheels) Gait Pattern/deviations: Step-to pattern, Decreased step length - right, Antalgic, Trunk flexed, Decreased stance time - right Gait velocity: decreased     General Gait Details: decreased amb distance due to increased pain.  Recliner pulled to Pt I have to sit   Stairs             Wheelchair Mobility     Tilt Bed    Modified Rankin (Stroke Patients Only)       Balance                                            Communication    Cognition Arousal: Alert     PT - Cognitive impairments: No apparent impairments                       PT - Cognition Comments: AxO x 3 pleasant and eager to go home today.  Lives home alone (spouse decreased) but will have Daghter and SIL assist. Following commands: Intact      Cueing Cueing Techniques: Verbal cues  Exercises      General Comments  Pertinent Vitals/Pain Pain Assessment Pain Assessment: 0-10 Pain Score: 4  Pain Location: R knee Pain Descriptors / Indicators: Aching, Constant, Grimacing, Operative site guarding Pain Intervention(s): Monitored during session, Premedicated before session, Repositioned, Ice applied    Home Living                          Prior Function            PT Goals (current goals can now be found in the care plan section) Progress towards PT goals: Progressing toward goals    Frequency    7X/week      PT Plan      Co-evaluation              AM-PAC PT 6 Clicks Mobility   Outcome Measure  Help needed turning from your back to your side while in a flat bed without using bedrails?: A Little Help needed moving from lying on your back to sitting on the side of a flat bed  without using bedrails?: A Little Help needed moving to and from a bed to a chair (including a wheelchair)?: A Little Help needed standing up from a chair using your arms (e.g., wheelchair or bedside chair)?: A Little Help needed to walk in hospital room?: A Little Help needed climbing 3-5 steps with a railing? : A Lot 6 Click Score: 17    End of Session Equipment Utilized During Treatment: Gait belt Activity Tolerance: Patient tolerated treatment well Patient left: in chair;with call bell/phone within reach;with chair alarm set Nurse Communication: Mobility status PT Visit Diagnosis: Unsteadiness on feet (R26.81);Other abnormalities of gait and mobility (R26.89);Muscle weakness (generalized) (M62.81);Difficulty in walking, not elsewhere classified (R26.2);Pain Pain - Right/Left: Right Pain - part of body: Knee;Leg     Time: 1334-1401 PT Time Calculation (min) (ACUTE ONLY): 27 min  Charges:    $Gait Training: 8-22 mins $Therapeutic Exercise: 8-22 mins $Therapeutic Activity: 8-22 mins PT General Charges $$ ACUTE PT VISIT: 1 Visit                     Katheryn Leap  PTA Acute  Rehabilitation Services Office M-F          [phone]

## 2024-03-08 NOTE — TOC Transition Note (Signed)
 Transition of Care [facility]) - Discharge Note   Patient Details  Name: Derrick Lawrence MRN: [id number] Date of Birth: 1941/01/12  Transition of Care [facility]) CM/SW Contact:  NORMAN ASPEN, LCSW Phone Number: 03/08/2024, 11:09 AM   Clinical Narrative:     Met with pt who confirms he has needed DME in the home.  Orders placed for HHPT.  Pt agreeable and no agency preference. Able to secure HHPT services with [facility].  No further IP CM needs.  Final next level of care: Home w Home Health Services Barriers to Discharge: No Barriers Identified   Patient Goals and CMS Choice Patient states their goals for this hospitalization and ongoing recovery are:: return home          Discharge Placement                       Discharge Plan and Services Additional resources added to the After Visit Summary for                  DME Arranged: N/A DME Agency: NA       HH Arranged: PT HH Agency: [facility] Date [facility] Agency Contacted: 03/08/24 Time HH Agency Contacted: 1030 Representative spoke with at [facility] Agency: Cindie  Social Drivers of Health (SDOH) Interventions SDOH Screenings   Food Insecurity: No Food Insecurity (03/07/2024)  Housing: High Risk (03/07/2024)  Transportation Needs: No Transportation Needs (03/07/2024)  Utilities: Not At Risk (03/07/2024)  Social Connections: Moderately Isolated (03/07/2024)  Tobacco Use: Low Risk (03/07/2024)     Readmission Risk Interventions     No data to display

## 2024-03-08 NOTE — Progress Notes (Signed)
° °  Subjective: 1 Day Post-Op Procedures (LRB): ARTHROPLASTY, KNEE, TOTAL (Right) Patient reports pain as mild.   Patient seen in rounds by Dr. Melodi. Patient is well, and has had no acute complaints or problems No issues overnight. Denies chest pain, SOB, or calf pain. Foley catheter removed this AM.  We will continue therapy today, ambulated 17' yesterday.   Objective: Vital signs in last 24 hours: Temp:  [97.4 F (36.3 C)-98.2 F (36.8 C)] 97.4 F (36.3 C) (12/16 0628) Pulse Rate:  [55-75] 62 (12/16 0628) Resp:  [11-18] 15 (12/16 0628) BP: (102-169)/(65-94) 135/76 (12/16 0628) SpO2:  [92 %-100 %] 94 % (12/16 0628)  Intake/Output from previous day:  Intake/Output Summary (Last 24 hours) at 03/08/2024 0835 Last data filed at 03/08/2024 0612 Gross per 24 hour  Intake 2583.86 ml  Output 2700 ml  Net -116.14 ml     Intake/Output this shift: No intake/output data recorded.  Labs: Recent Labs    03/07/24 1016 03/08/24 0343  HGB 15.0 13.4   Recent Labs    03/07/24 1016 03/08/24 0343  WBC  --  13.5*  RBC  --  4.41  HCT 44.0 38.3*  PLT  --  184   Recent Labs    03/07/24 1213 03/08/24 0343  NA 138 136  K 3.9 3.9  CL 105 102  CO2 26 26  BUN 11 17  CREATININE 0.86 1.03  GLUCOSE 125* 128*  CALCIUM  8.0* 8.1*   No results for input(s): LABPT, INR in the last 72 hours.  Exam: General - Patient is Alert and Oriented Extremity - Neurologically intact Neurovascular intact Sensation intact distally Dorsiflexion/Plantar flexion intact Dressing - dressing C/D/I Motor Function - intact, moving foot and toes well on exam.   Past Medical History:  Diagnosis Date   Arthritis    Depression    ED (erectile dysfunction)    GERD (gastroesophageal reflux disease)    H/O nonmelanoma skin cancer    Hyperlipidemia    Hypertension    Pneumonia    Stroke (HCC)    left sided weakness- minimal;    Assessment/Plan: 1 Day Post-Op Procedures (LRB): ARTHROPLASTY,  KNEE, TOTAL (Right) Principal Problem:   Primary osteoarthritis of right knee  Estimated body mass index is 38.01 kg/m as calculated from the following:   Height as of this encounter: 5' 8 (1.727 m).   Weight as of this encounter: 113.4 kg. Advance diet Up with therapy D/C IV fluids   Patient's anticipated LOS is less than 2 midnights, meeting these requirements: - Lives within 1 hour of care - Has a competent adult at home to recover with post-op recover - NO history of  - Chronic pain requiring opiods  - Diabetes  - Coronary Artery Disease  - Heart failure  - Heart attack  - DVT/VTE  - Cardiac arrhythmia  - Respiratory Failure/COPD  - Renal failure  - Anemia  - Advanced Liver disease     DVT Prophylaxis - Xarelto  Weight bearing as tolerated. Continue therapy.  Plan is to go Home after hospital stay. Plan for discharge later today if progresses with therapy and meeting goals. Plan for HHPT followed by OPPT. Follow-up in the office in 2 weeks.  The PDMP database was reviewed today prior to any opioid medications being prescribed to this patient.  Roxie Mess, PA-C Orthopedic Surgery [phone] 03/08/2024, 8:35 AM

## 2024-03-08 NOTE — Progress Notes (Signed)
 Discharge meds in a secure bag delivered to patient in room by this RN

## 2024-03-08 NOTE — Plan of Care (Signed)
  Problem: Pain Management: Goal: Pain level will decrease with appropriate interventions Outcome: Progressing   Problem: Activity: Goal: Range of joint motion will improve Outcome: Progressing   Problem: Education: Goal: Knowledge of the prescribed therapeutic regimen will improve Outcome: Progressing

## 2024-03-09 NOTE — Discharge Summary (Signed)
 Patient ID: Derrick Lawrence MRN: [id number] DOB/AGE: Aug 14, 1940 83 y.o.  Admit date: 03/07/2024 Discharge date: 03/08/2024  Admission Diagnoses:  Principal Problem:   Primary osteoarthritis of right knee   Discharge Diagnoses:  Same  Past Medical History:  Diagnosis Date   Arthritis    Depression    ED (erectile dysfunction)    GERD (gastroesophageal reflux disease)    H/O nonmelanoma skin cancer    Hyperlipidemia    Hypertension    Pneumonia    Stroke (HCC)    left sided weakness- minimal;    Surgeries: Procedures: ARTHROPLASTY, KNEE, TOTAL on 03/07/2024   Consultants:   Discharged Condition: Improved  Hospital Course: Derrick Lawrence is an 83 y.o. male who was admitted 03/07/2024 for operative treatment ofPrimary osteoarthritis of right knee. Patient has severe unremitting pain that affects sleep, daily activities, and work/hobbies. After pre-op clearance the patient was taken to the operating room on 03/07/2024 and underwent  Procedures: ARTHROPLASTY, KNEE, TOTAL.    Patient was given perioperative antibiotics:  Anti-infectives (From admission, onward)    Start     Dose/Rate Route Frequency Ordered Stop   03/07/24 1700  ceFAZolin  (ANCEF ) IVPB 2g/100 mL premix        2 g 200 mL/hr over 30 Minutes Intravenous Every 6 hours 03/07/24 1345 03/07/24 2325   03/07/24 0745  ceFAZolin  (ANCEF ) IVPB 2g/100 mL premix        2 g 200 mL/hr over 30 Minutes Intravenous On call to O.R. 03/07/24 0739 03/07/24 1058        Patient was given sequential compression devices, early ambulation, and chemoprophylaxis to prevent DVT.  Patient benefited maximally from hospital stay and there were no complications.    Recent vital signs: Patient Vitals for the past 24 hrs:  BP Temp Temp src Pulse Resp SpO2  03/08/24 1021 120/76 97.8 F (36.6 C) Oral 61 17 96 %     Recent laboratory studies:  Recent Labs    03/07/24 1016 03/07/24 1016 03/07/24 1213 03/08/24 0343  WBC  --    --   --  13.5*  HGB 15.0  --   --  13.4  HCT 44.0  --   --  38.3*  PLT  --   --   --  184  NA 139  --  138 136  K 3.5  --  3.9 3.9  CL  --   --  105 102  CO2  --   --  26 26  BUN  --   --  11 17  CREATININE  --   --  0.86 1.03  GLUCOSE  --   --  125* 128*  CALCIUM   --    < > 8.0* 8.1*   < > = values in this interval not displayed.     Discharge Medications:   Allergies as of 03/08/2024       Reactions   Gabapentin  Other (See Comments)   unsteady        Medication List     STOP taking these medications    aspirin  325 MG tablet       TAKE these medications    amLODipine  10 MG tablet Commonly known as: NORVASC  Take 1 tablet (10 mg total) by mouth daily.   atorvastatin  40 MG tablet Commonly known as: LIPITOR  Take 1 tablet (40 mg total) by mouth daily for cholesterol control. What changed: when to take this   doxazosin  8 MG tablet Commonly known as: CARDURA   Take 1 tablet (8 mg total) by mouth daily to improve bladder function.   FT Vitamin B-12 5000 MCG Subl Generic drug: Cyanocobalamin  Place 1 tablet (5,000 mcg total) under the tongue daily.   hydrochlorothiazide  12.5 MG tablet Commonly known as: HYDRODIURIL  Take 1 tablet (12.5 mg total) by mouth daily.   methocarbamol  500 MG tablet Commonly known as: ROBAXIN  Take 1 tablet (500 mg total) by mouth every 6 (six) hours as needed for muscle spasms.   metoprolol  succinate 100 MG 24 hr tablet Commonly known as: TOPROL -XL Take 1 tablet (100 mg total) by mouth daily for control of blood pressure.   omeprazole  20 MG capsule Commonly known as: PRILOSEC Take 1 capsule (20 mg total) by mouth daily.   ondansetron  4 MG tablet Commonly known as: ZOFRAN  Take 1 tablet (4 mg total) by mouth every 6 (six) hours as needed for nausea.   oxyCODONE  5 MG immediate release tablet Commonly known as: Oxy IR/ROXICODONE  Take 1 tablet (5 mg total) by mouth every 4 (four) hours as needed for severe pain (pain score 7-10).    potassium chloride  10 MEQ tablet Commonly known as: KLOR-CON  M Take 1 tablet (10 mEq total) by mouth daily.   sertraline  50 MG tablet Commonly known as: ZOLOFT  Take 1 tablet (50 mg total) by mouth daily.   Xarelto  10 MG Tabs tablet Generic drug: rivaroxaban  Take 1 tablet (10 mg total) by mouth daily with breakfast for 21 days. Then resume daily aspirin                Discharge Care Instructions  (From admission, onward)           Start     Ordered   03/08/24 0000  Weight bearing as tolerated        03/08/24 0837   03/08/24 0000  Change dressing       Comments: You may remove the bulky bandage (ACE wrap and gauze) two days after surgery. You will have an adhesive waterproof bandage underneath. Leave this in place until your first follow-up appointment.   03/08/24 0837            Diagnostic Studies: No results found.  Disposition: Discharge disposition: 01-Home or Self Care       Discharge Instructions     Call MD / Call 911   Complete by: As directed    If you experience chest pain or shortness of breath, CALL 911 and be transported to the hospital emergency room.  If you develope a fever above 101 F, pus (white drainage) or increased drainage or redness at the wound, or calf pain, call your surgeon's office.   Change dressing   Complete by: As directed    You may remove the bulky bandage (ACE wrap and gauze) two days after surgery. You will have an adhesive waterproof bandage underneath. Leave this in place until your first follow-up appointment.   Constipation Prevention   Complete by: As directed    Drink plenty of fluids.  Prune juice may be helpful.  You may use a stool softener, such as Colace (over the counter) 100 mg twice a day.  Use MiraLax  (over the counter) for constipation as needed.   Diet - low sodium heart healthy   Complete by: As directed    Do not put a pillow under the knee. Place it under the heel.   Complete by: As directed     Driving restrictions   Complete by: As directed  No driving for two weeks   Post-operative opioid taper instructions:   Complete by: As directed    POST-OPERATIVE OPIOID TAPER INSTRUCTIONS: It is important to wean off of your opioid medication as soon as possible. If you do not need pain medication after your surgery it is ok to stop day one. Opioids include: Codeine, Hydrocodone(Norco, Vicodin), Oxycodone (Percocet, oxycontin ) and hydromorphone  amongst others.  Long term and even short term use of opiods can cause: Increased pain response Dependence Constipation Depression Respiratory depression And more.  Withdrawal symptoms can include Flu like symptoms Nausea, vomiting And more Techniques to manage these symptoms Hydrate well Eat regular healthy meals Stay active Use relaxation techniques(deep breathing, meditating, yoga) Do Not substitute Alcohol  to help with tapering If you have been on opioids for less than two weeks and do not have pain than it is ok to stop all together.  Plan to wean off of opioids This plan should start within one week post op of your joint replacement. Maintain the same interval or time between taking each dose and first decrease the dose.  Cut the total daily intake of opioids by one tablet each day Next start to increase the time between doses. The last dose that should be eliminated is the evening dose.      TED hose   Complete by: As directed    Use stockings (TED hose) for three weeks on both leg(s).  You may remove them at night for sleeping.   Weight bearing as tolerated   Complete by: As directed         Contact information for follow-up providers     Melodi Lerner, MD Follow up in 2 week(s).   Specialty: Orthopedic Surgery Contact information: [address] [place] [address] [place] [address] [phone]              Contact information for after-discharge care     Home Medical Care     [facility]) .   Service: Home Health Services Contact information: [address] [place]  [address] [phone]                      Signed: Roxie Mess 03/09/2024, 8:23 AM

## 2024-03-10 ENCOUNTER — Other Ambulatory Visit (HOSPITAL_COMMUNITY): Payer: Self-pay

## 2024-03-12 ENCOUNTER — Encounter (HOSPITAL_COMMUNITY): Payer: Self-pay | Admitting: Emergency Medicine

## 2024-03-12 ENCOUNTER — Emergency Department (HOSPITAL_COMMUNITY)
Admission: EM | Admit: 2024-03-12 | Discharge: 2024-03-12 | Disposition: A | Discharge status: Home / Self Care | Attending: Emergency Medicine | Admitting: Emergency Medicine

## 2024-03-12 ENCOUNTER — Other Ambulatory Visit: Payer: Self-pay

## 2024-03-12 DIAGNOSIS — Z7901 Long term (current) use of anticoagulants: Secondary | ICD-10-CM | POA: Diagnosis not present

## 2024-03-12 DIAGNOSIS — Z79899 Other long term (current) drug therapy: Secondary | ICD-10-CM | POA: Insufficient documentation

## 2024-03-12 DIAGNOSIS — Z5189 Encounter for other specified aftercare: Secondary | ICD-10-CM

## 2024-03-12 DIAGNOSIS — M96830 Postprocedural hemorrhage and hematoma of a musculoskeletal structure following a musculoskeletal system procedure: Secondary | ICD-10-CM | POA: Diagnosis present

## 2024-03-12 MED ORDER — OXYCODONE HCL 5 MG PO TABS
5.0000 mg | ORAL_TABLET | Freq: Once | ORAL | Status: AC
  Administered 2024-03-12: 5 mg via ORAL
  Filled 2024-03-12: qty 1

## 2024-03-12 NOTE — ED Notes (Signed)
 Patient unable to bend knee to sit in car. States needing ambulance to take patient home.

## 2024-03-12 NOTE — ED Provider Notes (Signed)
 "  EMERGENCY DEPARTMENT AT [facility] Provider Note   CSN: [id number] Arrival date & time: 03/12/24  0845     Patient presents with: Post-op Problem   Derrick Lawrence is a 83 y.o. male.  Presents ER today for evaluation of bleeding from surgical incision to the right knee.  He had a TKA on 12/15, has been doing physical therapy and states that yesterday he believes that he had stretched too far, he states it was painful and today noticed that the bandage had blood through.  He has not seen the wound, denies fever or chills, no other complaints. He is on Xarelto  for DVT prevention postsurgery  HPI     Prior to Admission medications  Medication Sig Start Date End Date Taking? Authorizing Provider  amLODipine  (NORVASC ) 10 MG tablet Take 1 tablet (10 mg total) by mouth daily. 01/27/24     atorvastatin  (LIPITOR ) 40 MG tablet Take 1 tablet (40 mg total) by mouth daily for cholesterol control. Patient taking differently: Take 40 mg by mouth at bedtime. 01/27/24     Cyanocobalamin  (VITAMIN B-12) 5000 MCG SUBL Place 1 tablet (5,000 mcg total) under the tongue daily. 01/27/24 04/26/24  Anita Bernardino BROCKS, FNP  doxazosin  (CARDURA ) 8 MG tablet Take 1 tablet (8 mg total) by mouth daily to improve bladder function. 01/27/24     hydrochlorothiazide  (HYDRODIURIL ) 12.5 MG tablet Take 1 tablet (12.5 mg total) by mouth daily. 01/27/24     methocarbamol  (ROBAXIN ) 500 MG tablet Take 1 tablet (500 mg total) by mouth every 6 (six) hours as needed for muscle spasms. 03/08/24   Edmisten, Roxie CROME, PA  metoprolol  succinate (TOPROL -XL) 100 MG 24 hr tablet Take 1 tablet (100 mg total) by mouth daily for control of blood pressure. 01/27/24     omeprazole  (PRILOSEC) 20 MG capsule Take 1 capsule (20 mg total) by mouth daily. 01/27/24     ondansetron  (ZOFRAN ) 4 MG tablet Take 1 tablet (4 mg total) by mouth every 6 (six) hours as needed for nausea. 03/08/24   Edmisten, Kristie L, PA  oxyCODONE  (OXY  IR/ROXICODONE ) 5 MG immediate release tablet Take 1 tablet (5 mg total) by mouth every 4 (four) hours as needed for severe pain (pain score 7-10). 03/08/24   Edmisten, Kristie L, PA  potassium chloride  (KLOR-CON  M) 10 MEQ tablet Take 1 tablet (10 mEq total) by mouth daily. 01/27/24     rivaroxaban  (XARELTO ) 10 MG TABS tablet Take 1 tablet (10 mg total) by mouth daily with breakfast for 21 days. Then resume daily aspirin  03/09/24 03/30/24  Edmisten, Kristie L, PA  sertraline  (ZOLOFT ) 50 MG tablet Take 1 tablet (50 mg total) by mouth daily. 01/27/24       Allergies: Gabapentin     Review of Systems  Updated Vital Signs BP (!) 190/82 (BP Location: Left Arm)   Pulse 71   Temp 99.5 F (37.5 C) (Oral)   Resp 14   Ht 5' 8 (1.727 m)   Wt 113.4 kg   SpO2 94%   BMI 38.01 kg/m   Physical Exam Vitals and nursing note reviewed.  Constitutional:      General: He is not in acute distress.    Appearance: He is well-developed.  HENT:     Head: Normocephalic and atraumatic.  Eyes:     Conjunctiva/sclera: Conjunctivae normal.  Cardiovascular:     Rate and Rhythm: Normal rate and regular rhythm.     Heart sounds: No murmur heard. Pulmonary:  Effort: Pulmonary effort is normal. No respiratory distress.     Breath sounds: Normal breath sounds.  Abdominal:     Palpations: Abdomen is soft.     Tenderness: There is no abdominal tenderness.  Musculoskeletal:        General: No swelling.     Cervical back: Neck supple.     Comments: Incision to the right knee is intact, tiny bit of dried blood at the incision but there is no dehiscence.  There is no erythema or warmth, no fluctuance.  No active bleeding.  Skin:    General: Skin is warm and dry.     Capillary Refill: Capillary refill takes less than 2 seconds.     Comments: Incision to the right knee is intact, tiny bit of dried blood at the incision but there is no dehiscence.  There is no erythema or warmth, no fluctuance.  No active bleeding.   Neurological:     General: No focal deficit present.     Mental Status: He is alert and oriented to person, place, and time.  Psychiatric:        Mood and Affect: Mood normal.     (all labs ordered are listed, but only abnormal results are displayed) Labs Reviewed - No data to display  EKG: None  Radiology: No results found.   Procedures   Medications Ordered in the ED - No data to display                                  Medical Decision Making Differential diagnosis includes but not limited to wound dehiscence, abscess, cellulitis, coagulopathy,  ED course: Patient presents for evaluation of his right knee incision.  He had a TKA 5 days ago.  He states that bandage had soaked through with blood and he wanted to be evaluated, he believes that this is caused by stretching from physical therapy.  Fortunately the wound is intact there is no dehiscence, no signs of infection.  There is some mild tenderness as expected for recent surgery but no concerning features.  Will redress the wound, advised on close follow-up with orthopedics and strict return precautions.  Advised on stretching with caution.  EKG had been done by ED tech, patient signed any chest pain or shortness of breath, no specific indication for this.  Shows a PVC but no acute changes.        Final diagnoses:  None    ED Discharge Orders     None          Suellen Sherran DELENA DEVONNA 03/12/24 [id number]    Dean Clarity, MD 03/12/24 1658  "

## 2024-03-12 NOTE — Discharge Instructions (Addendum)
 It was a pleasure taking care of you.  You were seen in the emergency room today for evaluation of bleeding from your incision.  Fortunately the incision appears to be healing, there is no active bleeding.  Keep it clean and dry and dressed per your surgeons instructions, please stretch with caution to avoid further bleeding or wound disruption. Call your surgeon's office to let them know what happened

## 2024-03-12 NOTE — ED Notes (Signed)
 TED hose placed on right leg as requested from patients daughter.

## 2024-03-12 NOTE — ED Triage Notes (Signed)
 Per [facility] pt coming from home- had right knee surgery on Monday. States last night began noticing bleeding from dressing. Patient took one of his oxycodones around 7am. Patient A&0x4.

## 2024-03-14 ENCOUNTER — Other Ambulatory Visit: Payer: Self-pay

## 2024-03-18 ENCOUNTER — Other Ambulatory Visit: Payer: Self-pay

## 2024-03-21 ENCOUNTER — Other Ambulatory Visit: Payer: Self-pay

## 2024-03-21 ENCOUNTER — Other Ambulatory Visit (HOSPITAL_COMMUNITY): Payer: Self-pay

## 2024-03-21 MED ORDER — ATORVASTATIN CALCIUM 40 MG PO TABS
40.0000 mg | ORAL_TABLET | Freq: Every day | ORAL | 11 refills | Status: AC
  Filled 2024-03-21: qty 30, 30d supply, fill #0
  Filled 2024-04-20: qty 30, 30d supply, fill #1

## 2024-03-22 ENCOUNTER — Other Ambulatory Visit: Payer: Self-pay

## 2024-04-18 ENCOUNTER — Ambulatory Visit: Payer: Self-pay | Attending: Orthopedic Surgery | Admitting: Physical Therapy

## 2024-04-20 ENCOUNTER — Other Ambulatory Visit: Payer: Self-pay
